# Patient Record
Sex: Male | Born: 1980
Health system: Southern US, Community
[De-identification: ages and names within clinical notes are randomized; demographics above are authoritative.]

## PROBLEM LIST (undated history)

## (undated) DIAGNOSIS — M5126 Other intervertebral disc displacement, lumbar region: Secondary | ICD-10-CM

## (undated) HISTORY — PX: HIP SURGERY: SHX245

---

## 2003-03-17 ENCOUNTER — Emergency Department (HOSPITAL_COMMUNITY): Admission: EM | Admit: 2003-03-17 | Discharge: 2003-03-17 | Payer: Self-pay | Admitting: Emergency Medicine

## 2006-04-10 ENCOUNTER — Inpatient Hospital Stay (HOSPITAL_COMMUNITY): Admission: EM | Admit: 2006-04-10 | Discharge: 2006-04-13 | Payer: Self-pay | Admitting: Emergency Medicine

## 2009-01-26 ENCOUNTER — Emergency Department (HOSPITAL_COMMUNITY): Admission: EM | Admit: 2009-01-26 | Discharge: 2009-01-26 | Payer: Self-pay | Admitting: Emergency Medicine

## 2010-09-19 NOTE — H&P (Signed)
NAME:  Kenneth Blackwell, Kenneth Blackwell NO.:  [id number]   MEDICAL RECORD NO.:  [id number]          PATIENT TYPE:  INP   LOCATION:  0101                         FACILITY:  [facility]   PHYSICIAN:  Leonides Grills, M.D.     DATE OF BIRTH:  1980/06/15   DATE OF ADMISSION:  04/10/2006  DATE OF DISCHARGE:                              HISTORY & PHYSICAL   CHIEF COMPLAINT:  Right hip pain since 1:00 a.m. today.   HISTORY:  This is a 30 year old male who was wrestling with a friend  around 1:00 a.m. today.  He had a forced hyperextension injury to his  right hip.  He had immediate pain and unable to ambulate.  EMS was  called, and he was taken to [facility] ED, where x-rays were obtained,  and I was consulted for further evaluation and treatment.   He has no medical problems.   FAMILY HISTORY:  His grandmother is alive at 47, has a history of  hypertension.   He is a smoker, half pack per day for seven years.  He is a Consulting civil engineer at  [employer].   No known drug allergies.   Is not taking any medications.   REVIEW OF SYSTEMS:  He denies any chest pain, fever, chills,  palpitations, abdominal pain, shortness of breath, wheezing, and no  other areas of pain other than his right hip.   PHYSICAL EXAMINATION:  VITAL SIGNS:  Temperature 97.7, pulse 80,  respirations 18, blood pressure 127/70.  GENERAL:  Well-developed and well-nourished in no apparent distress.  Very pleasant gentleman.  CHEST:  Clear to auscultation.  HEART:  Regular rate and rhythm.  ABDOMEN:  Soft and nontender.  VASCULAR/NEUROMUSCULAR:  Palpable radial pulse bilaterally, dorsalis  pedis and posterior tibial pulses.  Extremities are warm.  Sensation is  intact to light touch over L4-S1 distribution as well as a the C6  through C8 distribution, equal bilaterally.  EXTREMITIES:  His right lower extremity is held externally rotated, but  it is not shortened, compared to the contralateral side, and obviously  range of motion was  not tested.  There were no skin abnormalities around  the hip.   X-rays reveal a nondisplaced, basi-cervical right femoral neck fracture.   IMPRESSION:  Right nondisplaced femoral neck fracture, basi-cervical  type.   PLAN:  Patient is to be admitted.  He is at bedrest.  He is n.p.o.  I  spoke with Dr. Charlann Boxer, who is on call today, and he will perform this  procedure early this morning.  He was consented for a closed  reduction/percutaneous screw fixation versus open reduction/internal  fixation of his right femoral neck fracture.  We went over the risks,  which  include infection, nerve and vessel injury, nonunion/malunion, hardware  rotation under failure, avascular necrosis of the head, and possible  arthritis of the hip were all experienced.  Questions were encouraged  and answered.  We will proceed with this, obviously in the near future.  Labs were ordered and are pending.      Leonides Grills, M.D.  Electronically Signed     PB/MEDQ  D:  04/10/2006  T:  04/10/2006  Job:  [id number]

## 2010-09-19 NOTE — Discharge Summary (Signed)
NAMEMarland Kitchen  Kenneth Blackwell, Kenneth Blackwell NO.:  [id number]   MEDICAL RECORD NO.:  [id number]          PATIENT TYPE:  INP   LOCATION:  1506                         FACILITY:  [facility]   PHYSICIAN:  Madlyn Frankel. Charlann Boxer, M.D.  DATE OF BIRTH:  [birth year]   DATE OF ADMISSION:  04/10/2006  DATE OF DISCHARGE:  04/13/2006                               DISCHARGE SUMMARY   ADMITTING DIAGNOSIS:  Femur fracture.Marland Kitchen   DISCHARGE DIAGNOSIS:  Femur fracture.   CONSULTATIONS:  None.Marland Kitchen   PROCEDURE:  Open reduction internal fixation of right femur fracture.   SURGEON:  Madlyn Frankel. Charlann Boxer, M.D.   BRIEF HISTORY:  This 30 year old male was wrestling with a friend around  1:00 a.m.; he had a forced hyperextension injury to his right hip.  He  had immediate pain and was unable to walk.  EMS was called and he was  taken to the [facility] Emergency Department, where x-rays were  obtained and we were consulted for further evaluation and treatment.   LABS:  White blood cells on April 10, 2006 was 13.7, hemoglobin 31.8,  hematocrit 41.4.  Prior to discharge hemoglobin 11.7, hematocrit 34.5  and stable.  Differential showed no significant findings upon discharge  and showed no significant findings.  Coagulation within normal limits.  Routine chemistries showed:  Sodium 139, potassium 4.0, glucose 100;  prior to discharge all within normal limits except glucose was bumped to  142.   DIAGNOSTIC TESTING:  Right hip and pelvis x-ray showed a basocervical  fracture of the right femoral neck.   HOSPITAL COURSE:  The patient was seen in the emergency department by  our partner Dr Lestine Box.  He was then consulted and seen by Dr. Charlann Boxer.  He  was taken to an operating room for open reduction internal fixation with  a percutaneous screw fixation with DePuy titanium screws -- by surgeon  Dr. Durene Romans.  He tolerated the procedure well and was admitted to  the orthopedic floor.   The patient's pain was well-controlled.   On postop day #1 the patient  was able to rest comfortably, hemodynamically he was stable.  He was  neuromuscularly and vascularly intact.  Dressing was clean, dry and  intact.  PT and OT were started.  The patient was able to ambulate with  the use of crutches.   The patient did well through postop day #2.  By postop day #3 (April 13, 2006) he was stable.  He was ready to go home.  The wound was clean,  dry and intact.  Neuromuscularly and vascularly he was intact.  He was  prescribed Lovenox and given instructions on its use.  He was to be  weightbearing as tolerated with the use of crutches.   DISCHARGE DISPOSITION:  Discharged to home stable and improved.   DISCHARGE MEDICATIONS:  1. Lovenox 40 mg one subcutaneously daily x2 weeks.  2. Vicodin 1-2 tablets p.o. q.4-6 h. p.r.n.  3. Robaxin 500 mg 1-2 p.o. q.4-6 h. p.r.n. muscle spasm pain.  4. Enteric-coated aspirin 325 mg x3 weeks after the Lovenox.   DISCHARGE WOUND CARE:  Keep wound dry.  May shower; however, wrap, dry  and then redress as needed.   DISCHARGE PHYSICAL THERAPY:  The patient is weightbearing as tolerated  with the use of crutches as needed.  Physical therapy recommendations  were 7x per week for the first week.   DISCHARGE FOLLOW-UP APPOINTMENTS:  Madlyn Frankel. Charlann Boxer, M.D. [phone] in 10  days to 2 weeks after discharge for wound check.  He can return if his  condition worsens.  If he develops any shortness of breath or severe  calf pain, he should call emergency service immediately.     ______________________________  Yetta Glassman [address], [place]      Madlyn Frankel. Charlann Boxer, M.D.  Electronically Signed    BLM/MEDQ  D:  05/06/2006  T:  05/06/2006  Job:  [id number]
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
## 2011-01-28 ENCOUNTER — Emergency Department (HOSPITAL_COMMUNITY)
Admission: EM | Admit: 2011-01-28 | Discharge: 2011-01-28 | Disposition: A | Discharge status: Home / Self Care | Payer: Self-pay | Attending: Emergency Medicine | Admitting: Emergency Medicine

## 2011-01-28 ENCOUNTER — Emergency Department (HOSPITAL_BASED_OUTPATIENT_CLINIC_OR_DEPARTMENT_OTHER)
Admission: EM | Admit: 2011-01-28 | Discharge: 2011-01-28 | Disposition: A | Discharge status: Home / Self Care | Payer: Self-pay | Attending: Emergency Medicine | Admitting: Emergency Medicine

## 2011-01-28 ENCOUNTER — Encounter: Payer: Self-pay | Admitting: *Deleted

## 2011-01-28 ENCOUNTER — Emergency Department (INDEPENDENT_AMBULATORY_CARE_PROVIDER_SITE_OTHER): Payer: Self-pay

## 2011-01-28 DIAGNOSIS — R51 Headache: Secondary | ICD-10-CM

## 2011-01-28 DIAGNOSIS — R42 Dizziness and giddiness: Secondary | ICD-10-CM | POA: Insufficient documentation

## 2011-01-28 DIAGNOSIS — R5383 Other fatigue: Secondary | ICD-10-CM | POA: Insufficient documentation

## 2011-01-28 DIAGNOSIS — R0989 Other specified symptoms and signs involving the circulatory and respiratory systems: Secondary | ICD-10-CM

## 2011-01-28 DIAGNOSIS — J329 Chronic sinusitis, unspecified: Secondary | ICD-10-CM | POA: Insufficient documentation

## 2011-01-28 DIAGNOSIS — R002 Palpitations: Secondary | ICD-10-CM | POA: Insufficient documentation

## 2011-01-28 DIAGNOSIS — J322 Chronic ethmoidal sinusitis: Secondary | ICD-10-CM

## 2011-01-28 DIAGNOSIS — R5381 Other malaise: Secondary | ICD-10-CM | POA: Insufficient documentation

## 2011-01-28 DIAGNOSIS — F17200 Nicotine dependence, unspecified, uncomplicated: Secondary | ICD-10-CM | POA: Insufficient documentation

## 2011-01-28 DIAGNOSIS — J320 Chronic maxillary sinusitis: Secondary | ICD-10-CM

## 2011-01-28 MED ORDER — AMOXICILLIN 500 MG PO CAPS: 500.0000 mg | ORAL_CAPSULE | Freq: Three times a day (TID) | ORAL | Status: AC

## 2011-01-28 MED ORDER — KETOROLAC TROMETHAMINE 60 MG/2ML IM SOLN
60.0000 mg | Freq: Once | INTRAMUSCULAR | Status: AC
  Administered 2011-01-28: 60 mg via INTRAMUSCULAR
  Filled 2011-01-28: qty 2

## 2011-01-28 MED ORDER — METOCLOPRAMIDE HCL 5 MG/ML IJ SOLN
10.0000 mg | Freq: Once | INTRAMUSCULAR | Status: AC
  Administered 2011-01-28: 10 mg via INTRAMUSCULAR
  Filled 2011-01-28: qty 2

## 2011-01-28 NOTE — ED Notes (Signed)
Patient was at [facility] prior to coming to [facility]. Family states that patient was triaged and then they left prior to being seen by provider.

## 2011-01-28 NOTE — ED Provider Notes (Signed)
History     CSN: [id number] Arrival date & time: 01/28/2011  8:08 PM  Chief Complaint  Patient presents with  . URI    (Consider location/radiation/quality/duration/timing/severity/associated sxs/prior treatment) HPI Comments: Pt states that he has been taking cold medication over the last couple of days and this morning he felt near syncopal:pt state that he reached up to get and iron and he "Blanked out":pt state that he didn't loose consciousness   Patient is a 30 y.o. male presenting with URI. The history is provided by the patient. No language interpreter was used.  URI The primary symptoms include headaches, sore throat and cough. Primary symptoms do not include fever, nausea, vomiting or rash. The current episode started 3 to 5 days ago. This is a new problem. The problem has been gradually worsening.  Symptoms associated with the illness include facial pain, sinus pressure and congestion.    History reviewed. No pertinent past medical history.  Past Surgical History  Procedure Date  . Hip surgery     No family history on file.  History  Substance Use Topics  . Smoking status: Current Everyday Smoker  . Smokeless tobacco: Not on file  . Alcohol Use: Yes      Review of Systems  Constitutional: Negative for fever.  HENT: Positive for congestion, sore throat and sinus pressure.   Respiratory: Positive for cough.   Gastrointestinal: Negative for nausea and vomiting.  Skin: Negative for rash.  Neurological: Positive for headaches.  All other systems reviewed and are negative.    Allergies  Review of patient's allergies indicates no known allergies.  Home Medications   Current Outpatient Rx  Name Route Sig Dispense Refill  . HYDROCODONE-ACETAMINOPHEN PO Oral Take 2 tablets by mouth once.      . IBUPROFEN 200 MG PO TABS Oral Take 200 mg by mouth every 6 (six) hours as needed. pain     . DAYTIME SINUS RELIEF PO Oral Take 2 tablets by mouth every 6 (six) hours  as needed. Sinus congestion     . AMOXICILLIN 500 MG PO CAPS Oral Take 1 capsule (500 mg total) by mouth 3 (three) times daily. 30 capsule 0    BP 150/89  Pulse 68  Temp(Src) 97.5 F (36.4 C) (Oral)  Resp 20  Ht 6' (1.829 m)  Wt 148 lb (67.132 kg)  BMI 20.07 kg/m2  SpO2 99%  Physical Exam  Nursing note and vitals reviewed. Constitutional: He is oriented to person, place, and time. He appears well-developed and well-nourished.  HENT:  Head: Normocephalic and atraumatic.  Right Ear: External ear normal.  Left Ear: External ear normal.  Nose: Rhinorrhea present.  Mouth/Throat: Oropharynx is clear and moist.  Eyes: Pupils are equal, round, and reactive to light.  Neck: Normal range of motion. Neck supple.  Cardiovascular: Regular rhythm.   Pulmonary/Chest: Effort normal and breath sounds normal.  Abdominal: Soft.  Musculoskeletal: Normal range of motion.  Neurological: He is alert and oriented to person, place, and time.  Skin: Skin is warm and dry.  Psychiatric: He has a normal mood and affect.    ED Course  Procedures (including critical care time)  Labs Reviewed - No data to display Ct Head Wo Contrast  01/28/2011  *RADIOLOGY REPORT*  Clinical Data: Headache and congestion.  CT HEAD WITHOUT CONTRAST  Technique:  Contiguous axial images were obtained from the base of the skull through the vertex without contrast.  Comparison: None  Findings: The ventricles are normal.  No  extra-axial fluid collections are seen.  The brainstem and cerebellum are unremarkable.  No acute intracranial findings such as infarction or hemorrhage.  No mass lesions.  The bony calvarium is intact.  There is bilateral acute maxillary sinusitis and scattered ethmoid sinus disease.  The sphenoid sinuses clear and the frontal sinuses clear.  The mastoid air cells and middle ear cavities are clear.  IMPRESSION:  1.  No acute intracranial findings or mass lesion. 2.  Acute maxillary and ethmoid sinusitis.   Original Report Authenticated By: P. Loralie Champagne, M.D.     1. Sinusitis       MDM  Pt is feeling better after the medications:will treat for sinusitis        Teressa Lower, NP 01/28/11 2142

## 2011-01-28 NOTE — ED Notes (Signed)
Pt states that he had cold symptoms and took some medication last night for congestion. Since waking up this morning he has been feeling dizzy and this morning "he blanked out and when he came to he thought he was having a seizure because he was still holding the iron and shaking".  Feels like "his brain is tingling" and his heart is racing. HR is 90

## 2011-01-28 NOTE — ED Notes (Signed)
Pt reports cold symptoms (cough/runny nose/congestoin) x3-4days. Took OTC cold medicine last night. States this morning, began having a headache with "tingling" to his head, accompanied by dizziness, and pt states he passed out today while reaching for an iron. States "i was out for a couple seconds" denies injury.

## 2011-01-29 NOTE — ED Provider Notes (Signed)
Evaluation and management procedures were performed by the mid-level provider (PA/NP/CNM) under my supervision/collaboration. I was present and available during the ED course. Tatyanna Cronk Y.   Gavin Pound. Oletta Lamas, MD 01/29/11 1610

## 2012-09-07 ENCOUNTER — Encounter (HOSPITAL_COMMUNITY): Payer: Self-pay | Admitting: *Deleted

## 2012-09-07 ENCOUNTER — Emergency Department (HOSPITAL_COMMUNITY)
Admission: EM | Admit: 2012-09-07 | Discharge: 2012-09-07 | Disposition: A | Discharge status: Home / Self Care | Payer: Self-pay | Attending: Emergency Medicine | Admitting: Emergency Medicine

## 2012-09-07 ENCOUNTER — Emergency Department (HOSPITAL_COMMUNITY): Payer: Self-pay

## 2012-09-07 DIAGNOSIS — N410 Acute prostatitis: Secondary | ICD-10-CM

## 2012-09-07 DIAGNOSIS — N509 Disorder of male genital organs, unspecified: Secondary | ICD-10-CM | POA: Insufficient documentation

## 2012-09-07 DIAGNOSIS — N419 Inflammatory disease of prostate, unspecified: Secondary | ICD-10-CM | POA: Insufficient documentation

## 2012-09-07 DIAGNOSIS — F17200 Nicotine dependence, unspecified, uncomplicated: Secondary | ICD-10-CM | POA: Insufficient documentation

## 2012-09-07 LAB — URINALYSIS, ROUTINE W REFLEX MICROSCOPIC
Glucose, UA: NEGATIVE mg/dL
Hgb urine dipstick: NEGATIVE
Leukocytes, UA: NEGATIVE
Nitrite: NEGATIVE
Protein, ur: NEGATIVE mg/dL
Specific Gravity, Urine: 1.033 — ABNORMAL HIGH (ref 1.005–1.030)

## 2012-09-07 MED ORDER — IBUPROFEN 800 MG PO TABS: 800.0000 mg | ORAL_TABLET | Freq: Three times a day (TID) | ORAL | Status: DC | PRN

## 2012-09-07 MED ORDER — CIPROFLOXACIN HCL 500 MG PO TABS: 500.0000 mg | ORAL_TABLET | Freq: Two times a day (BID) | ORAL | Status: DC

## 2012-09-07 NOTE — ED Provider Notes (Signed)
Medical screening examination/treatment/procedure(s) were performed by non-physician practitioner and as supervising physician I was immediately available for consultation/collaboration.   Gwyneth Sprout, MD 09/07/12 2355

## 2012-09-07 NOTE — ED Notes (Signed)
Pt c/o dysuria and testicle pain x's 1 week. Denies penile discharge or hematuria.

## 2012-09-07 NOTE — ED Provider Notes (Signed)
History     CSN: [id number]  Arrival date & time 09/07/12  1522   First MD Initiated Contact with Patient 09/07/12 1704      Chief Complaint  Patient presents with  . Dysuria  . Testicle Pain    (Consider location/radiation/quality/duration/timing/severity/associated sxs/prior treatment) HPI Patient presents emergency Department with some dysuria, and lower scrotal pain for the last 3 days.  Patient, states his girlfriend was diagnosed with bacterial vaginosis patient, states it is not have any pain in his central testicle or discharge.  Patient denies fever, nausea, vomiting, diarrhea, abdominal pain, weakness, or syncope.  Patient, states, that he did not take any medications prior to arrival. History reviewed. No pertinent past medical history.  Past Surgical History  Procedure Laterality Date  . Hip surgery      History reviewed. No pertinent family history.  History  Substance Use Topics  . Smoking status: Current Every Day Smoker    Types: Cigarettes  . Smokeless tobacco: Not on file  . Alcohol Use: Yes      Review of Systems All other systems negative except as documented in the HPI. All pertinent positives and negatives as reviewed in the HPI. Allergies  Review of patient's allergies indicates no known allergies.  Home Medications  No current outpatient prescriptions on file.  BP 114/79  Pulse 71  Temp(Src) 98.3 F (36.8 C) (Oral)  Resp 16  SpO2 100%  Physical Exam  Nursing note and vitals reviewed. Constitutional: He is oriented to person, place, and time. He appears well-developed and well-nourished.  HENT:  Head: Normocephalic and atraumatic.  Cardiovascular: Normal rate and regular rhythm.   Pulmonary/Chest: Effort normal.  Abdominal: Hernia confirmed negative in the right inguinal area and confirmed negative in the left inguinal area.  Genitourinary: Testes normal.    Right testis shows no mass, no swelling and no tenderness. Left testis  shows no mass, no swelling and no tenderness. No penile tenderness. No discharge found.  Lymphadenopathy:       Right: No inguinal adenopathy present.       Left: No inguinal adenopathy present.  Neurological: He is alert and oriented to person, place, and time.  Skin: Skin is warm and dry.    ED Course  Procedures (including critical care time)  Labs Reviewed  URINALYSIS, ROUTINE W REFLEX MICROSCOPIC - Abnormal; Notable for the following:    Specific Gravity, Urine 1.033 (*)    All other components within normal limits   US Scrotum  09/07/2012  *RADIOLOGY REPORT*  Clinical Data:  Bilateral scrotal pain 1 week  SCROTAL ULTRASOUND DOPPLER ULTRASOUND OF THE TESTICLES  Technique: Complete ultrasound examination of the testicles, epididymis, and other scrotal structures was performed.  Color and spectral Doppler ultrasound were also utilized to evaluate blood flow to the testicles.  Comparison:  No  Findings:  Right testis:    3.9 x 2.5 x 4.0 cm.  Negative for mass.  Left testis:  4.0 x 3.0 x 3.4 cm.  Negative for mass.  Right epididymis:  Normal in size and appearance.  Left epididymis:  Normal in size and appearance.  Hydrocele:  Small bilateral hydroceles containing echogenic debris  Varicocele:  Bilateral varicocele.  Increased blood flow on Valsalva bilaterally.  Pulsed Doppler interrogation of both testes demonstrates low resistance flow bilaterally.  IMPRESSION: Negative for testicular torsion.  Negative for mass lesion.  Bilateral varicocele.  Bilateral complex hydrocele   Original Report Authenticated By: Janeece Riggers, M.D.    [place] Art/ven  Flow Abd Pelv Doppler  09/07/2012  *RADIOLOGY REPORT*  Clinical Data:  Bilateral scrotal pain 1 week  SCROTAL ULTRASOUND DOPPLER ULTRASOUND OF THE TESTICLES  Technique: Complete ultrasound examination of the testicles, epididymis, and other scrotal structures was performed.  Color and spectral Doppler ultrasound were also utilized to evaluate blood flow to the  testicles.  Comparison:  No  Findings:  Right testis:    3.9 x 2.5 x 4.0 cm.  Negative for mass.  Left testis:  4.0 x 3.0 x 3.4 cm.  Negative for mass.  Right epididymis:  Normal in size and appearance.  Left epididymis:  Normal in size and appearance.  Hydrocele:  Small bilateral hydroceles containing echogenic debris  Varicocele:  Bilateral varicocele.  Increased blood flow on Valsalva bilaterally.  Pulsed Doppler interrogation of both testes demonstrates low resistance flow bilaterally.  IMPRESSION: Negative for testicular torsion.  Negative for mass lesion.  Bilateral varicocele.  Bilateral complex hydrocele   Original Report Authenticated By: Janeece Riggers, M.D.     We'll have the patient Followup urology, and we'll place on Cipro for 21 days for presumed prostatitis.  Patient is a rest return here for any worsening in his condition.   MDM  MDM Reviewed: vitals and nursing note Interpretation: labs and ultrasound            Carlyle Dolly, PA-C 09/07/12 1852

## 2016-08-19 DIAGNOSIS — R3129 Other microscopic hematuria: Secondary | ICD-10-CM | POA: Diagnosis not present

## 2016-08-19 DIAGNOSIS — Z0000 Encounter for general adult medical examination without abnormal findings: Secondary | ICD-10-CM | POA: Diagnosis not present

## 2016-08-19 DIAGNOSIS — Z13220 Encounter for screening for lipoid disorders: Secondary | ICD-10-CM | POA: Diagnosis not present

## 2016-08-19 DIAGNOSIS — Z23 Encounter for immunization: Secondary | ICD-10-CM | POA: Diagnosis not present

## 2017-07-27 ENCOUNTER — Encounter (HOSPITAL_BASED_OUTPATIENT_CLINIC_OR_DEPARTMENT_OTHER): Payer: Self-pay | Admitting: *Deleted

## 2017-07-27 ENCOUNTER — Other Ambulatory Visit: Payer: Self-pay

## 2017-07-27 ENCOUNTER — Emergency Department (HOSPITAL_BASED_OUTPATIENT_CLINIC_OR_DEPARTMENT_OTHER)
Admission: EM | Admit: 2017-07-27 | Discharge: 2017-07-28 | Disposition: A | Discharge status: Home / Self Care | Payer: BLUE CROSS/BLUE SHIELD | Attending: Emergency Medicine | Admitting: Emergency Medicine

## 2017-07-27 ENCOUNTER — Emergency Department (HOSPITAL_BASED_OUTPATIENT_CLINIC_OR_DEPARTMENT_OTHER): Payer: BLUE CROSS/BLUE SHIELD

## 2017-07-27 DIAGNOSIS — Z79899 Other long term (current) drug therapy: Secondary | ICD-10-CM | POA: Insufficient documentation

## 2017-07-27 DIAGNOSIS — R0981 Nasal congestion: Secondary | ICD-10-CM | POA: Diagnosis not present

## 2017-07-27 DIAGNOSIS — J069 Acute upper respiratory infection, unspecified: Secondary | ICD-10-CM | POA: Diagnosis not present

## 2017-07-27 DIAGNOSIS — F17210 Nicotine dependence, cigarettes, uncomplicated: Secondary | ICD-10-CM | POA: Insufficient documentation

## 2017-07-27 DIAGNOSIS — R69 Illness, unspecified: Secondary | ICD-10-CM

## 2017-07-27 DIAGNOSIS — B9789 Other viral agents as the cause of diseases classified elsewhere: Secondary | ICD-10-CM | POA: Insufficient documentation

## 2017-07-27 DIAGNOSIS — J111 Influenza due to unidentified influenza virus with other respiratory manifestations: Secondary | ICD-10-CM

## 2017-07-27 DIAGNOSIS — J029 Acute pharyngitis, unspecified: Secondary | ICD-10-CM | POA: Diagnosis not present

## 2017-07-27 DIAGNOSIS — R05 Cough: Secondary | ICD-10-CM | POA: Diagnosis not present

## 2017-07-27 NOTE — ED Triage Notes (Signed)
Cough, fever, headache, chills x 2 days.

## 2017-07-27 NOTE — ED Notes (Signed)
Pt. Reports cough for a couple of days.  Pt. Reports he came tonight because he has had body aches and chills.

## 2017-07-28 MED ORDER — BENZONATATE 100 MG PO CAPS: 100.0000 mg | ORAL_CAPSULE | Freq: Three times a day (TID) | ORAL | 0 refills | Status: DC

## 2017-07-28 MED ORDER — IBUPROFEN 400 MG PO TABS
600.0000 mg | ORAL_TABLET | Freq: Once | ORAL | Status: AC
  Administered 2017-07-28: 600 mg via ORAL
  Filled 2017-07-28: qty 1

## 2017-07-28 NOTE — ED Provider Notes (Signed)
[facility] Provider Note   CSN: [id number] Arrival date & time: 07/27/17  2119     History   Chief Complaint Chief Complaint  Patient presents with  . Cough    HPI Kenneth Blackwell is a 37 y.o. male who presents for evaluation of cough, nasal congestion, rhinorrhea, subjective fever chills and body aches have been going on for 3 days.  Cough is productive of green phlegm.  He reports he does not take any medications for the pain.  He reports subjective fever but states he is actually measure the temperature.  Reports he has been able to eat without any difficulty.  Patient denies any chest pain, difficulty breathing, nausea/vomiting, diarrhea.  Patient states he did not get a flu shot this year.  The history is provided by the patient.    History reviewed. No pertinent past medical history.  There are no active problems to display for this patient.   Past Surgical History:  Procedure Laterality Date  . HIP SURGERY          Home Medications    Prior to Admission medications   Medication Sig Start Date End Date Taking? Authorizing Provider  benzonatate (TESSALON) 100 MG capsule Take 1 capsule (100 mg total) by mouth every 8 (eight) hours. 07/28/17   Volanda Napoleon, PA-C  ciprofloxacin (CIPRO) 500 MG tablet Take 1 tablet (500 mg total) by mouth 2 (two) times daily. 09/07/12   Lawyer, Harrell Gave, PA-C  ibuprofen (ADVIL,MOTRIN) 800 MG tablet Take 1 tablet (800 mg total) by mouth every 8 (eight) hours as needed for pain. 09/07/12   Dalia Heading, PA-C    Family History No family history on file.  Social History Social History   Tobacco Use  . Smoking status: Current Every Day Smoker    Types: Cigarettes  . Smokeless tobacco: Never Used  Substance Use Topics  . Alcohol use: Yes  . Drug use: No     Allergies   Patient has no known allergies.   Review of Systems Review of Systems  Constitutional: Positive for chills and  fever.  HENT: Positive for congestion and rhinorrhea.   Respiratory: Positive for cough. Negative for shortness of breath.   Cardiovascular: Negative for chest pain.  Gastrointestinal: Negative for abdominal pain, nausea and vomiting.  Genitourinary: Negative for dysuria and hematuria.  Musculoskeletal: Positive for myalgias.  Neurological: Negative for headaches.     Physical Exam Updated Vital Signs BP 131/87   Pulse 67   Temp 98 F (36.7 C) (Oral)   Resp 18   Ht 6' (1.829 m)   Wt 68 kg (150 lb)   SpO2 100%   BMI 20.34 kg/m   Physical Exam  Constitutional: He is oriented to person, place, and time. He appears well-developed and well-nourished.  HENT:  Head: Normocephalic and atraumatic.  Nose: Mucosal edema and rhinorrhea present.  Mouth/Throat: Uvula is midline, oropharynx is clear and moist and mucous membranes are normal.  Airway is patent, phonation is intact.  Posterior pharynx without erythema, edema, exudates.  No neck or facial swelling.  Eyes: Pupils are equal, round, and reactive to light. Conjunctivae, EOM and lids are normal.  Neck: Full passive range of motion without pain.  Cardiovascular: Normal rate, regular rhythm, normal heart sounds and normal pulses. Exam reveals no gallop and no friction rub.  No murmur heard. Pulmonary/Chest: Effort normal and breath sounds normal.  No evidence of respiratory distress. Able to speak in full sentences without difficulty.  presents for evaluation of 3 days of nasal congestion, rhinorrhea, cough, generalized body aches, subjective fever chills.  Abdominal: Soft. Normal appearance. There is no tenderness. There is no rigidity and no guarding.  Musculoskeletal: Normal range of motion.  Neurological: He is alert and oriented to person, place, and time.  Skin: Skin is warm and dry. Capillary refill takes less than 2 seconds.  Psychiatric: He has a normal mood and affect. His speech is normal.  Nursing note and vitals  reviewed.    ED Treatments / Results  Labs (all labs ordered are listed, but only abnormal results are displayed) Labs Reviewed - No data to display  EKG None  Radiology Dg Chest 2 View  Result Date: 07/28/2017 CLINICAL DATA:  Cough and fever EXAM: CHEST - 2 VIEW COMPARISON:  None. FINDINGS: The heart size and mediastinal contours are within normal limits. Both lungs are clear. The visualized skeletal structures are unremarkable. IMPRESSION: No active cardiopulmonary disease. Electronically Signed   By: Donavan Foil M.D.   On: 07/28/2017 00:05    Procedures Procedures (including critical care time)  Medications Ordered in ED Medications  ibuprofen (ADVIL,MOTRIN) tablet 600 mg (600 mg Oral Given 07/28/17 0039)     Initial Impression / Assessment and Plan / ED Course  I have reviewed the triage vital signs and the nursing notes.  Pertinent labs & imaging results that were available during my care of the patient were reviewed by me and considered in my medical decision making (see chart for details).     38 y.o. M who presents for evaluation of generalized body aches, subjective fever chills, cough, nasal congestion, rhinorrhea.  No abdominal pain, chest pain, difficulty breathing.  Patient is afebrile, non-toxic appearing, sitting comfortably on examination table. Vital signs reviewed and stable.  Lungs clear to auscultation bilaterally.  No evidence of respiratory distress.  Consider URI with cough versus pneumonia versus influenza.    Chest x-ray reviewed.  Negative for any acute infectious etiology.Discussed results with patient.  Given that patient has been symptomatic for 3 days, he is out of the window for treatment for influenza.  I discussed supportive therapies with patient.  Encourage adequate rest and hydration.  We will plan to send home for symptomatic relief of cough.  Instructed patient to follow-up with primary care doctor the next 2-4 days for further evaluation.  Patient had ample opportunity for questions and discussion. All patient's questions were answered with full understanding. Strict return precautions discussed. Patient expresses understanding and agreement to plan.    Final Clinical Impressions(s) / ED Diagnoses   Final diagnoses:  Viral URI with cough  Influenza-like illness    ED Discharge Orders        Ordered    benzonatate (TESSALON) 100 MG capsule  Every 8 hours     07/28/17 0026       Volanda Napoleon, PA-C 07/28/17 3664    Palumbo, April, MD 07/28/17 4034

## 2017-07-28 NOTE — Discharge Instructions (Signed)
You can take Tylenol or Ibuprofen as directed for pain. You can alternate Tylenol and Ibuprofen every 4 hours. If you take Tylenol at 1pm, then you can take Ibuprofen at 5pm. Then you can take Tylenol again at 9pm.   Make sure you are staying hydrated and drinking plenty of water.   Return to the emergency department for any fever, chest pain, difficulty breathing, vomiting or any other worsening or concerning symptoms.

## 2017-08-23 DIAGNOSIS — Z13220 Encounter for screening for lipoid disorders: Secondary | ICD-10-CM | POA: Diagnosis not present

## 2017-08-23 DIAGNOSIS — Z0000 Encounter for general adult medical examination without abnormal findings: Secondary | ICD-10-CM | POA: Diagnosis not present

## 2018-11-24 DIAGNOSIS — Z0000 Encounter for general adult medical examination without abnormal findings: Secondary | ICD-10-CM | POA: Diagnosis not present

## 2018-12-01 DIAGNOSIS — Z8249 Family history of ischemic heart disease and other diseases of the circulatory system: Secondary | ICD-10-CM | POA: Diagnosis not present

## 2018-12-01 DIAGNOSIS — Z0000 Encounter for general adult medical examination without abnormal findings: Secondary | ICD-10-CM | POA: Diagnosis not present

## 2018-12-01 DIAGNOSIS — Z13220 Encounter for screening for lipoid disorders: Secondary | ICD-10-CM | POA: Diagnosis not present

## 2018-12-01 DIAGNOSIS — F17200 Nicotine dependence, unspecified, uncomplicated: Secondary | ICD-10-CM | POA: Diagnosis not present

## 2019-01-07 DIAGNOSIS — R05 Cough: Secondary | ICD-10-CM | POA: Diagnosis not present

## 2019-01-07 DIAGNOSIS — G4489 Other headache syndrome: Secondary | ICD-10-CM | POA: Diagnosis not present

## 2019-01-07 DIAGNOSIS — U071 COVID-19: Secondary | ICD-10-CM | POA: Diagnosis not present

## 2019-01-07 DIAGNOSIS — R509 Fever, unspecified: Secondary | ICD-10-CM | POA: Diagnosis not present

## 2019-09-11 ENCOUNTER — Ambulatory Visit
Admission: EM | Admit: 2019-09-11 | Discharge: 2019-09-11 | Disposition: A | Discharge status: Home / Self Care | Payer: 59 | Attending: Physician Assistant | Admitting: Physician Assistant

## 2019-09-11 DIAGNOSIS — M25422 Effusion, left elbow: Secondary | ICD-10-CM | POA: Diagnosis not present

## 2019-09-11 DIAGNOSIS — W57XXXA Bitten or stung by nonvenomous insect and other nonvenomous arthropods, initial encounter: Secondary | ICD-10-CM

## 2019-09-11 DIAGNOSIS — R350 Frequency of micturition: Secondary | ICD-10-CM

## 2019-09-11 LAB — POCT URINALYSIS DIP (MANUAL ENTRY)
Bilirubin, UA: NEGATIVE
Glucose, UA: NEGATIVE mg/dL
Ketones, POC UA: NEGATIVE mg/dL
Leukocytes, UA: NEGATIVE
Nitrite, UA: NEGATIVE
Protein Ur, POC: NEGATIVE mg/dL
Spec Grav, UA: >=1.03 — AB (ref 1.010–1.025)
Urobilinogen, UA: 0.2 E.U./dL
pH, UA: 5.5 (ref 5.0–8.0)

## 2019-09-11 MED ORDER — HYDROXYZINE HCL 25 MG PO TABS: 25.0000 mg | ORAL_TABLET | Freq: Four times a day (QID) | ORAL | 0 refills | Status: DC

## 2019-09-11 MED ORDER — TRIAMCINOLONE ACETONIDE 0.1 % EX CREA: 1.0000 "application " | TOPICAL_CREAM | Freq: Two times a day (BID) | CUTANEOUS | 0 refills | Status: DC

## 2019-09-11 NOTE — Discharge Instructions (Addendum)
Insect bite/left elbow swelling Start hydroxyzine for itching. Triamcinolone on affected area. Ice compress to the elbow, avoid scratching. If noticing increased pain, spreading redness, follow up for reevaluation  Urinary frequency No signs of infection or sugar in urine. However, does show you are dehydrated. Decrease soda, tea, coffee intake. Keep hydrated, urine should be clear to pale yellow in color. Follow up with PCP if symptoms not improving.

## 2019-09-11 NOTE — ED Provider Notes (Signed)
[name]    CSN: [id number] Arrival date & time: 09/11/19  [place]      History   Chief Complaint Chief Complaint  Patient presents with  . Insect Bite    HPI Kenneth Blackwell is a 39 y.o. male.   39 year old male comes in for multiple complaints.  1.  2-day history of left elbow, right posterior thigh itching after possible insect bite.  States started since scratching due to itching, and now noticing significant swelling to left elbow with warmth, erythema, throbbing sensation.  Denies decrease in range of motion.  Denies fever.  2.  2-week history of urinary frequency, urgency.  Denies dysuria, hematuria.  Denies abdominal pain, nausea, vomiting.  Denies penile discharge, penile lesion, testicular swelling, testicular pain.  Denies history of diabetes.  States does not drink water frequently, but does drink soda, teas.     History reviewed. No pertinent past medical history.  There are no problems to display for this patient.   Past Surgical History:  Procedure Laterality Date  . HIP SURGERY         Home Medications    Prior to Admission medications   Medication Sig Start Date End Date Taking? Authorizing Provider  hydrOXYzine (ATARAX/VISTARIL) 25 MG tablet Take 1 tablet (25 mg total) by mouth every 6 (six) hours. 09/11/19   Tasia Catchings, Doug Bucklin V, PA-C  triamcinolone cream (KENALOG) 0.1 % Apply 1 application topically 2 (two) times daily. 09/11/19   Ok Edwards, PA-C    Family History History reviewed. No pertinent family history.  Social History Social History   Tobacco Use  . Smoking status: Current Every Day Smoker    Types: Cigarettes  . Smokeless tobacco: Never Used  Substance Use Topics  . Alcohol use: Yes  . Drug use: No     Allergies   Patient has no known allergies.   Review of Systems Review of Systems  Reason unable to perform ROS: See HPI as above.     Physical Exam Triage Vital Signs ED Triage Vitals  Enc Vitals Group     BP  09/11/19 1708 (!) 113/58     Pulse Rate 09/11/19 1708 85     Resp 09/11/19 1708 16     Temp 09/11/19 1708 98.1 F (36.7 C)     Temp Source 09/11/19 1708 Oral     SpO2 09/11/19 1708 95 %     Weight --      Height --      Head Circumference --      Peak Flow --      Pain Score 09/11/19 1714 4     Pain Loc --      Pain Edu? --      Excl. in [place]? --    No data found.  Updated Vital Signs BP (!) 113/58 (BP Location: Left Arm)   Pulse 85   Temp 98.1 F (36.7 C) (Oral)   Resp 16   SpO2 95%   Visual Acuity Right Eye Distance:   Left Eye Distance:   Bilateral Distance:    Right Eye Near:   Left Eye Near:    Bilateral Near:     Physical Exam Constitutional:      General: He is not in acute distress.    Appearance: Normal appearance. He is well-developed. He is not toxic-appearing or diaphoretic.  HENT:     Head: Normocephalic and atraumatic.  Eyes:     Conjunctiva/sclera: Conjunctivae normal.  Pupils: Pupils are equal, round, and reactive to light.  Pulmonary:     Effort: Pulmonary effort is normal. No respiratory distress.     Comments: Speaking in full sentences without difficulty Musculoskeletal:     Cervical back: Normal range of motion and neck supple.     Comments: Left elbow with significant swelling, mostly medially. Erythema with warmth. No induration, no tenderness to palpation. Full ROM  Skin:    General: Skin is warm and dry.     Comments: Right posterior thigh with 3cm x 2cm papule, excoriations noted. Surrounding erythema without warmth. No tenderness  Neurological:     Mental Status: He is alert and oriented to person, place, and time.      UC Treatments / Results  Labs (all labs ordered are listed, but only abnormal results are displayed) Labs Reviewed  POCT URINALYSIS DIP (MANUAL ENTRY) - Abnormal; Notable for the following components:      Result Value   Spec Grav, UA >=1.030 (*)    Blood, UA small (*)    All other components within normal  limits    EKG   Radiology No results found.  Procedures Procedures (including critical care time)  Medications Ordered in UC Medications - No data to display  Initial Impression / Assessment and Plan / UC Course  I have reviewed the triage vital signs and the nursing notes.  Pertinent labs & imaging results that were available during my care of the patient were reviewed by me and considered in my medical decision making (see chart for details).    1. Insect bite/left elbow swelling Likely inflammatory in nature.  Will provide hydroxyzine, triamcinolone for itching.  Ice compress to the elbow.  Return precautions given.  2.  Urinary frequency  Urine negative for glucose, infection.  Discussed at this time to decrease caffeine use, increase water intake.  Continue to monitor symptoms.  Return precautions given.  Patient expresses understanding and agrees to plan.  Final Clinical Impressions(s) / UC Diagnoses   Final diagnoses:  Elbow swelling, left  Insect bite, multiple  Urinary frequency   ED Prescriptions    Medication Sig Dispense Auth. Provider   hydrOXYzine (ATARAX/VISTARIL) 25 MG tablet Take 1 tablet (25 mg total) by mouth every 6 (six) hours. 12 tablet Koji Niehoff V, PA-C   triamcinolone cream (KENALOG) 0.1 % Apply 1 application topically 2 (two) times daily. 30 g Ok Edwards, PA-C     PDMP not reviewed this encounter.   Ok Edwards, PA-C 09/11/19 [id number]

## 2019-09-11 NOTE — ED Triage Notes (Signed)
Pt states was bite by something to lt elbow and rt upper back of leg early Sunday morning. States now having swelling to lt elbow. Pt also having urinary urgency and frequency x2wks.

## 2019-09-14 IMAGING — CR DG CHEST 2V
2 series · 2 of 2 positions shown · non-contrast
Comparison: None.

CLINICAL DATA: Cough and fever

EXAM:
CHEST - 2 VIEW

[w chest pa]
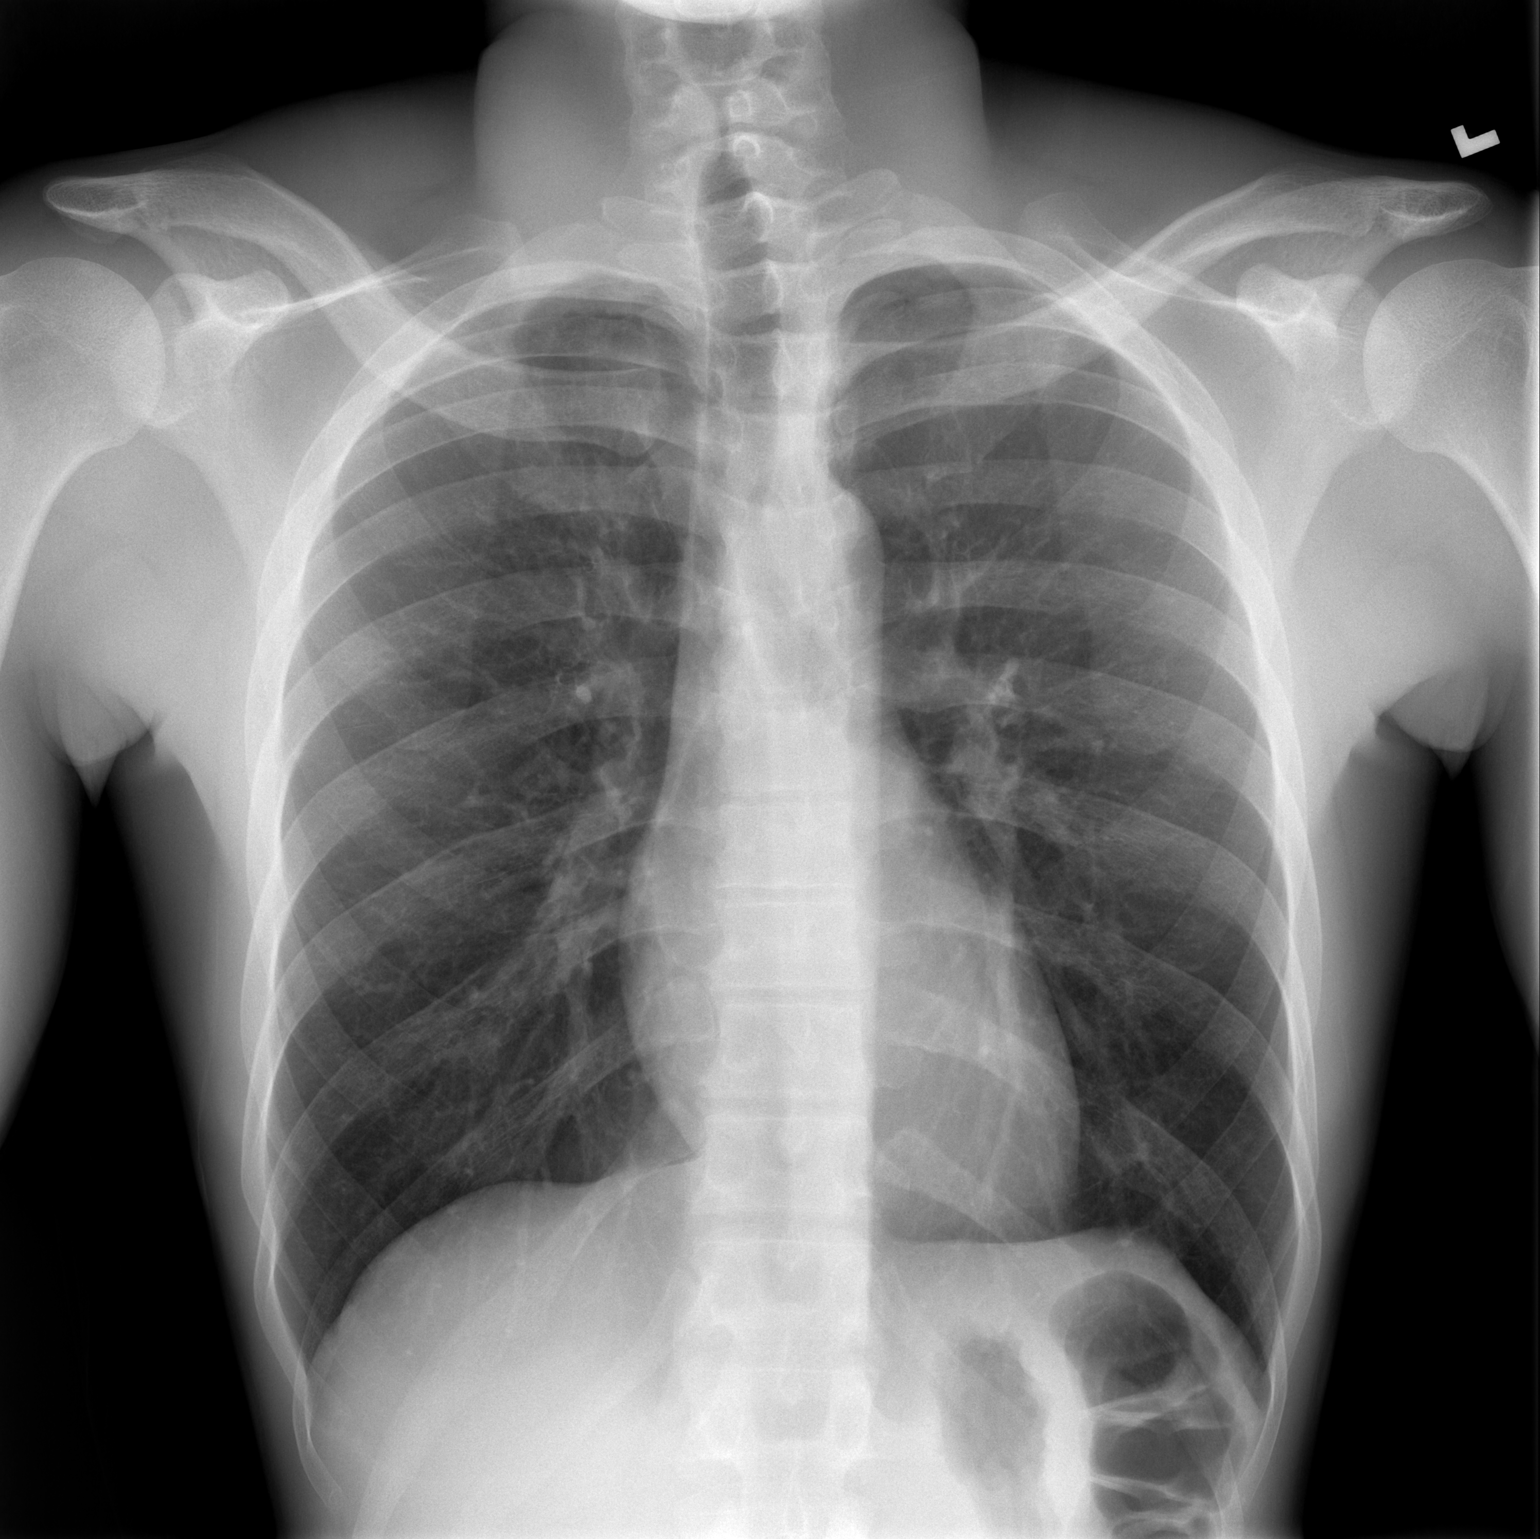

[w chest lat]
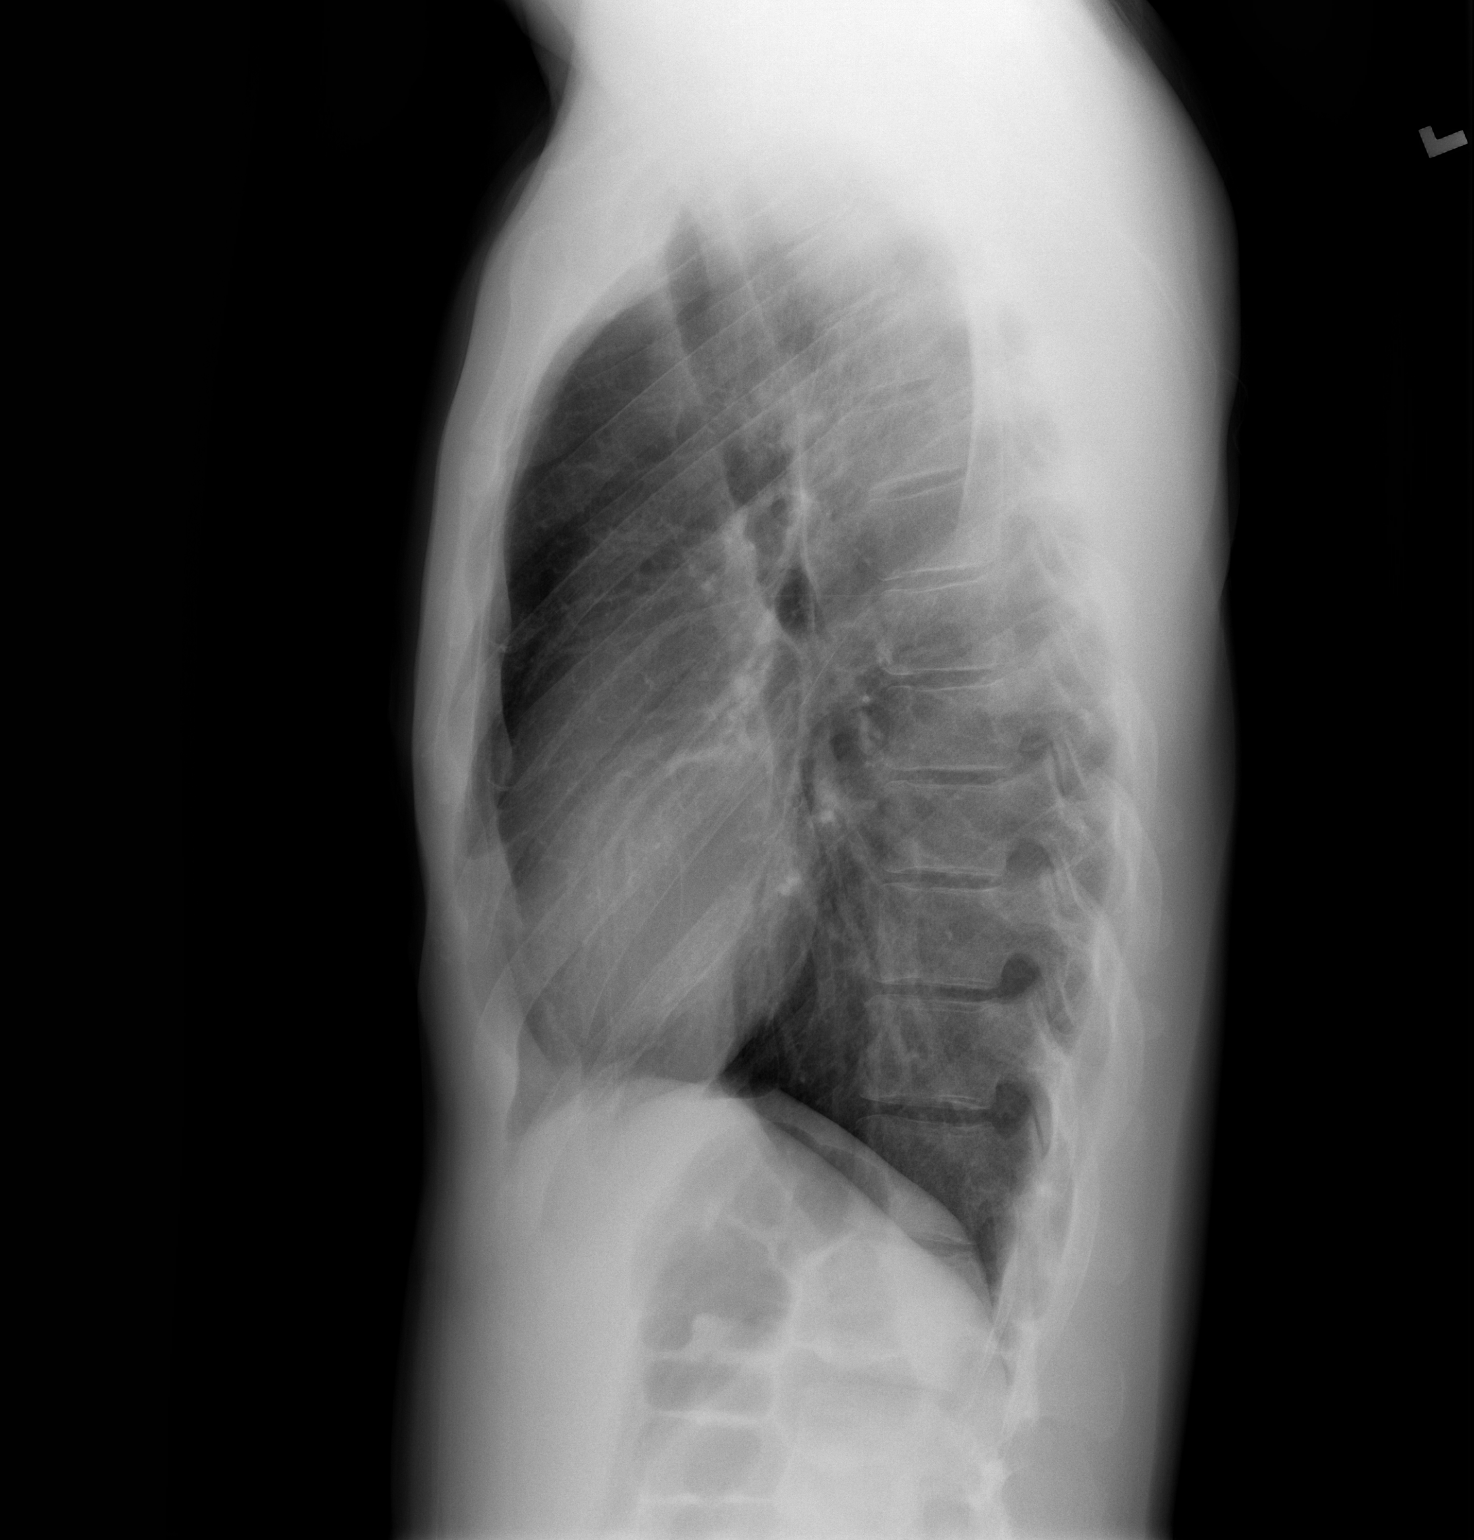

[2 of 2 positions shown; findings below may reference images not displayed]

FINDINGS: The heart size and mediastinal contours are within normal limits.
Both lungs are clear. The visualized skeletal structures are
unremarkable.
IMPRESSION: No active cardiopulmonary disease.

## 2020-04-03 DIAGNOSIS — M545 Low back pain, unspecified: Secondary | ICD-10-CM | POA: Insufficient documentation

## 2020-04-30 DIAGNOSIS — M5416 Radiculopathy, lumbar region: Secondary | ICD-10-CM | POA: Insufficient documentation

## 2020-08-25 ENCOUNTER — Observation Stay (HOSPITAL_COMMUNITY)
Admission: EM | Admit: 2020-08-25 | Discharge: 2020-08-26 | Disposition: A | Discharge status: Home / Self Care | Payer: 59 | Attending: Surgery | Admitting: Surgery

## 2020-08-25 ENCOUNTER — Other Ambulatory Visit: Payer: Self-pay

## 2020-08-25 ENCOUNTER — Emergency Department (HOSPITAL_COMMUNITY): Payer: 59

## 2020-08-25 ENCOUNTER — Encounter (HOSPITAL_COMMUNITY): Payer: Self-pay

## 2020-08-25 DIAGNOSIS — R1012 Left upper quadrant pain: Secondary | ICD-10-CM | POA: Diagnosis present

## 2020-08-25 DIAGNOSIS — F17210 Nicotine dependence, cigarettes, uncomplicated: Secondary | ICD-10-CM | POA: Insufficient documentation

## 2020-08-25 DIAGNOSIS — R109 Unspecified abdominal pain: Secondary | ICD-10-CM | POA: Diagnosis present

## 2020-08-25 DIAGNOSIS — R1909 Other intra-abdominal and pelvic swelling, mass and lump: Principal | ICD-10-CM | POA: Insufficient documentation

## 2020-08-25 DIAGNOSIS — Z20822 Contact with and (suspected) exposure to covid-19: Secondary | ICD-10-CM | POA: Diagnosis not present

## 2020-08-25 DIAGNOSIS — R1031 Right lower quadrant pain: Secondary | ICD-10-CM

## 2020-08-25 LAB — COMPREHENSIVE METABOLIC PANEL
ALT: 47 U/L — ABNORMAL HIGH (ref 0–44)
AST: 28 U/L (ref 15–41)
Albumin: 4.4 g/dL (ref 3.5–5.0)
Alkaline Phosphatase: 62 U/L (ref 38–126)
Anion gap: 7 (ref 5–15)
BUN: 10 mg/dL (ref 6–20)
CO2: 26 mmol/L (ref 22–32)
Calcium: 9.6 mg/dL (ref 8.9–10.3)
Chloride: 106 mmol/L (ref 98–111)
Creatinine, Ser: 1.18 mg/dL (ref 0.61–1.24)
GFR, Estimated: >60 mL/min (ref >60)
Glucose, Bld: 102 mg/dL — ABNORMAL HIGH (ref 70–99)
Potassium: 4.2 mmol/L (ref 3.5–5.1)
Sodium: 139 mmol/L (ref 135–145)
Total Bilirubin: 0.7 mg/dL (ref 0.3–1.2)
Total Protein: 7.6 g/dL (ref 6.5–8.1)

## 2020-08-25 LAB — CBC WITH DIFFERENTIAL/PLATELET
Abs Immature Granulocytes: 0.09 10*3/uL — ABNORMAL HIGH (ref 0.00–0.07)
Basophils Absolute: 0.1 10*3/uL (ref 0.0–0.1)
Basophils Relative: 0 %
Eosinophils Absolute: 0.4 10*3/uL (ref 0.0–0.5)
Eosinophils Relative: 2 %
HCT: 44.2 % (ref 39.0–52.0)
Hemoglobin: 14.3 g/dL (ref 13.0–17.0)
Immature Granulocytes: 0 %
Lymphocytes Relative: 10 %
Lymphs Abs: 2.1 10*3/uL (ref 0.7–4.0)
MCH: 26.5 pg (ref 26.0–34.0)
MCHC: 32.4 g/dL (ref 30.0–36.0)
MCV: 81.9 fL (ref 80.0–100.0)
Monocytes Absolute: 1.1 10*3/uL — ABNORMAL HIGH (ref 0.1–1.0)
Monocytes Relative: 5 %
Neutro Abs: 18.7 10*3/uL — ABNORMAL HIGH (ref 1.7–7.7)
Neutrophils Relative %: 83 %
Platelets: 259 10*3/uL (ref 150–400)
RBC: 5.4 MIL/uL (ref 4.22–5.81)
RDW: 15.5 % (ref 11.5–15.5)
WBC: 22.4 10*3/uL — ABNORMAL HIGH (ref 4.0–10.5)
nRBC: 0 % (ref 0.0–0.2)

## 2020-08-25 LAB — URINALYSIS, ROUTINE W REFLEX MICROSCOPIC
Bilirubin Urine: NEGATIVE
Glucose, UA: NEGATIVE mg/dL
Hgb urine dipstick: NEGATIVE
Ketones, ur: NEGATIVE mg/dL
Leukocytes,Ua: NEGATIVE
Nitrite: NEGATIVE
Protein, ur: NEGATIVE mg/dL
Specific Gravity, Urine: 1.012 (ref 1.005–1.030)
pH: 7 (ref 5.0–8.0)

## 2020-08-25 LAB — RESP PANEL BY RT-PCR (FLU A&B, COVID) ARPGX2
Influenza A by PCR: NEGATIVE
Influenza B by PCR: NEGATIVE
SARS Coronavirus 2 by RT PCR: NEGATIVE

## 2020-08-25 LAB — LIPASE, BLOOD: Lipase: 37 U/L (ref 11–51)

## 2020-08-25 MED ORDER — ENOXAPARIN SODIUM 30 MG/0.3ML ~~LOC~~ SOLN
30.0000 mg | SUBCUTANEOUS | Status: DC
  Filled 2020-08-25: qty 0.3

## 2020-08-25 MED ORDER — MORPHINE SULFATE (PF) 4 MG/ML IV SOLN
4.0000 mg | Freq: Once | INTRAVENOUS | Status: AC
  Administered 2020-08-25: 4 mg via INTRAVENOUS
  Filled 2020-08-25: qty 1

## 2020-08-25 MED ORDER — ONDANSETRON HCL 4 MG/2ML IJ SOLN: 4.0000 mg | Freq: Four times a day (QID) | INTRAMUSCULAR | Status: DC | PRN

## 2020-08-25 MED ORDER — SODIUM CHLORIDE 0.9 % IV BOLUS
500.0000 mL | Freq: Once | INTRAVENOUS | Status: AC
  Administered 2020-08-25: 500 mL via INTRAVENOUS

## 2020-08-25 MED ORDER — FAMOTIDINE IN NACL 20-0.9 MG/50ML-% IV SOLN
20.0000 mg | Freq: Once | INTRAVENOUS | Status: AC
  Administered 2020-08-25: 20 mg via INTRAVENOUS
  Filled 2020-08-25: qty 50

## 2020-08-25 MED ORDER — DIPHENHYDRAMINE HCL 25 MG PO CAPS
25.0000 mg | ORAL_CAPSULE | Freq: Four times a day (QID) | ORAL | Status: DC | PRN
Start: 2020-08-25 — End: 2020-08-26

## 2020-08-25 MED ORDER — ONDANSETRON 4 MG PO TBDP: 4.0000 mg | ORAL_TABLET | Freq: Four times a day (QID) | ORAL | Status: DC | PRN

## 2020-08-25 MED ORDER — DIPHENHYDRAMINE HCL 50 MG/ML IJ SOLN: 25.0000 mg | Freq: Four times a day (QID) | INTRAMUSCULAR | Status: DC | PRN

## 2020-08-25 MED ORDER — IOHEXOL 300 MG/ML  SOLN
100.0000 mL | Freq: Once | INTRAMUSCULAR | Status: AC | PRN
  Administered 2020-08-25: 100 mL via INTRAVENOUS

## 2020-08-25 MED ORDER — PIPERACILLIN-TAZOBACTAM 3.375 G IVPB
3.3750 g | Freq: Three times a day (TID) | INTRAVENOUS | Status: DC
  Administered 2020-08-25 – 2020-08-26 (×2): 3.375 g via INTRAVENOUS
  Filled 2020-08-25 (×2): qty 50

## 2020-08-25 MED ORDER — HYDROMORPHONE HCL 1 MG/ML IJ SOLN: 1.0000 mg | INTRAMUSCULAR | Status: DC | PRN

## 2020-08-25 MED ORDER — OXYCODONE HCL 5 MG PO TABS: 5.0000 mg | ORAL_TABLET | ORAL | Status: DC | PRN

## 2020-08-25 MED ORDER — POTASSIUM CHLORIDE IN NACL 20-0.9 MEQ/L-% IV SOLN
INTRAVENOUS | Status: DC
  Filled 2020-08-25 (×3): qty 1000

## 2020-08-25 MED ORDER — FAMOTIDINE IN NACL 20-0.9 MG/50ML-% IV SOLN
20.0000 mg | Freq: Two times a day (BID) | INTRAVENOUS | Status: DC
  Administered 2020-08-25 – 2020-08-26 (×2): 20 mg via INTRAVENOUS
  Filled 2020-08-25 (×2): qty 50

## 2020-08-25 MED ORDER — PIPERACILLIN-TAZOBACTAM 3.375 G IVPB 30 MIN
3.3750 g | Freq: Once | INTRAVENOUS | Status: AC
  Administered 2020-08-25: 3.375 g via INTRAVENOUS
  Filled 2020-08-25: qty 50

## 2020-08-25 NOTE — ED Triage Notes (Signed)
Ems brings pt in from home for abdominal pain. Pt reports left sided abdominal pain that is going into his back. Pt reports nausea.

## 2020-08-25 NOTE — ED Notes (Signed)
Consult to General Surgery@14 :15pm.

## 2020-08-25 NOTE — ED Notes (Signed)
Pt. Documented in error see above note in chart. 

## 2020-08-25 NOTE — ED Provider Notes (Signed)
[facility] DEPT Provider Note   CSN: [id number] Arrival date & time: 08/25/20  1126     History Chief Complaint  Patient presents with  . Abdominal Pain    Shafin Pollio is a 40 y.o. male.  40 year old male with prior medical history detailed below presents for evaluation of left upper quadrant abdominal pain.  Patient reports onset abdominal pain over the course of today.  He denies fever.  He reports mild nausea.  He denies vomiting.  He denies prior abdominal surgery.  He denies urinary or bowel movement changes.  The history is provided by the patient and medical records.  Abdominal Pain Pain location:  LUQ Pain quality: aching   Pain radiates to:  Does not radiate Pain severity:  Moderate Onset quality:  Gradual Duration:  12 hours Timing:  Constant Progression:  Worsening Chronicity:  New Relieved by:  Nothing Worsened by:  Nothing      History reviewed. No pertinent past medical history.  There are no problems to display for this patient.   Past Surgical History:  Procedure Laterality Date  . HIP SURGERY         No family history on file.  Social History   Tobacco Use  . Smoking status: Current Every Day Smoker    Types: Cigarettes  . Smokeless tobacco: Never Used  Substance Use Topics  . Alcohol use: Yes  . Drug use: No    Home Medications Prior to Admission medications   Medication Sig Start Date End Date Taking? Authorizing Provider  diclofenac (VOLTAREN) 75 MG EC tablet Take 75 mg by mouth daily at 8 pm.   Yes [provider]  gabapentin (NEURONTIN) 300 MG capsule Take 900 mg by mouth 3 (three) times daily.   Yes [provider]  hydrOXYzine (ATARAX/VISTARIL) 25 MG tablet Take 1 tablet (25 mg total) by mouth every 6 (six) hours. 09/11/19   Ok Edwards, PA-C    Allergies    Patient has no known allergies.  Review of Systems   Review of Systems  Gastrointestinal: Positive for abdominal  pain.  All other systems reviewed and are negative.   Physical Exam Updated Vital Signs BP 130/83 (BP Location: Right Arm)   Pulse 72   Temp 98.6 F (37 C)   Resp 16   Ht 6' (1.829 m)   Wt 68 kg   SpO2 100%   BMI 20.34 kg/m   Physical Exam Vitals and nursing note reviewed.  Constitutional:      General: He is not in acute distress.    Appearance: He is well-developed.  HENT:     Head: Normocephalic and atraumatic.  Eyes:     Conjunctiva/sclera: Conjunctivae normal.     Pupils: Pupils are equal, round, and reactive to light.  Cardiovascular:     Rate and Rhythm: Normal rate and regular rhythm.     Heart sounds: Normal heart sounds.  Pulmonary:     Effort: Pulmonary effort is normal. No respiratory distress.     Breath sounds: Normal breath sounds.  Abdominal:     General: There is no distension.     Palpations: Abdomen is soft.     Tenderness: There is abdominal tenderness in the left upper quadrant.  Musculoskeletal:        General: No deformity. Normal range of motion.     Cervical back: Normal range of motion and neck supple.  Skin:    General: Skin is warm and dry.  Neurological:     Mental Status: He is alert and oriented to person, place, and time.     ED Results / Procedures / Treatments   Labs (all labs ordered are listed, but only abnormal results are displayed) Labs Reviewed  COMPREHENSIVE METABOLIC PANEL - Abnormal; Notable for the following components:      Result Value   Glucose, Bld 102 (*)    ALT 47 (*)    All other components within normal limits  CBC WITH DIFFERENTIAL/PLATELET - Abnormal; Notable for the following components:   WBC 22.4 (*)    Neutro Abs 18.7 (*)    Monocytes Absolute 1.1 (*)    Abs Immature Granulocytes 0.09 (*)    All other components within normal limits  URINALYSIS, ROUTINE W REFLEX MICROSCOPIC - Abnormal; Notable for the following components:   Color, Urine STRAW (*)    All other components within normal limits   LIPASE, BLOOD    EKG EKG Interpretation  Date/Time:  Sunday August 25 2020 11:37:52 EDT Ventricular Rate:  76 PR Interval:  172 QRS Duration: 112 QT Interval:  372 QTC Calculation: 419 R Axis:   71 Text Interpretation: Sinus rhythm LAE, consider biatrial enlargement RSR' in V1 or V2, probably normal variant Left ventricular hypertrophy Confirmed by Dene Gentry [phone]) on 08/25/2020 12:09:01 PM   Radiology No results found.  Procedures Procedures   Medications Ordered in ED Medications  morphine 4 MG/ML injection 4 mg (4 mg Intravenous Given 08/25/20 1207)  famotidine (PEPCID) IVPB 20 mg premix (20 mg Intravenous New Bag/Given 08/25/20 1204)  sodium chloride 0.9 % bolus 500 mL (500 mLs Intravenous New Bag/Given 08/25/20 1205)  iohexol (OMNIPAQUE) 300 MG/ML solution 100 mL (100 mLs Intravenous Contrast Given 08/25/20 1312)    ED Course  I have reviewed the triage vital signs and the nursing notes.  Pertinent labs & imaging results that were available during my care of the patient were reviewed by me and considered in my medical decision making (see chart for details).    MDM Rules/Calculators/A&P                          MDM  MSE complete  Herbie Lehrmann was evaluated in Emergency Department on 08/25/2020 for the symptoms described in the history of present illness. He was evaluated in the context of the global COVID-19 pandemic, which necessitated consideration that the patient might be at risk for infection with the SARS-CoV-2 virus that causes COVID-19. Institutional protocols and algorithms that pertain to the evaluation of patients at risk for COVID-19 are in a state of rapid change based on information released by regulatory bodies including the CDC and federal and state organizations. These policies and algorithms were followed during the patient's care in the ED.  Patient is presenting with significant epigastric pain.  Onset of symptoms over the course of this  morning.  Patient with noted elevation in white count.  CT imaging suggest possible early appendicitis.  Repeat abdominal exam demonstrates improved epigastric discomfort but now the patient is reporting significant right lower quadrant tenderness with palpation.  CT plan discussed with patient and his wife.  Dr. Ninfa Linden of surgery is aware of case and CT imaging results.  Patient will be evaluated by surgery for likely admission for possible early appendicitis.   Final Clinical Impression(s) / ED Diagnoses Final diagnoses:  Right lower quadrant abdominal pain    Rx / DC Orders ED Discharge Orders  None       Valarie Merino, MD 08/25/20 [phone]

## 2020-08-25 NOTE — H&P (Signed)
Kenneth Blackwell is an 40 y.o. male.   Chief Complaint: epigastric abdominal pain HPI: This is a pleasant 40 year old gentleman who presents to the emergency department with abdominal pain.  He reports that he started having sharp, moderate to severe epigastric abdominal pain with burning around 6 AM this morning.  It moved to the left upper quadrant.  He had minimal nausea no vomiting.  He reports he moves his bowels normally.  He denies fever. He has a herniated disc in his lower back and has been on Neurontin as well as Voltaren daily and earlier this week received a steroid injection in his lower lumbar spine.  He was found to have a white blood count of 22,000.  He underwent a CT scan of the abdomen and pelvis.  He was found to have borderline dilation of the appendix with mild mucosal hyperenhancement but no regional inflammatory changes or fluid.  He was also seen to have a 2.9 cm enhancing left retroperitoneal mass near the distal duodenum of uncertain etiology.  Considerations include a neurogenic tumor or pheo.  On further questioning, he has 1 brother with a paraganglioma and another brother with some sort of ganglion tumor.  His grandmother had pancreatic cancer.  He currently denies pain in his lower abdomen.  He has no history of tachycardia, dizziness, lightheadedness, night sweats, or issues with his blood pressure.  History reviewed. No pertinent past medical history.  Past Surgical History:  Procedure Laterality Date  . HIP SURGERY      No family history on file. Social History:  reports that he has been smoking cigarettes. He has never used smokeless tobacco. He reports current alcohol use. He reports that he does not use drugs.  Allergies: No Known Allergies  (Not in a hospital admission)   Results for orders placed or performed during the hospital encounter of 08/25/20 (from the past 48 hour(s))  Comprehensive metabolic panel     Status: Abnormal   Collection Time: 08/25/20  11:45 AM  Result Value Ref Range   Sodium 139 135 - 145 mmol/L   Potassium 4.2 3.5 - 5.1 mmol/L   Chloride 106 98 - 111 mmol/L   CO2 26 22 - 32 mmol/L   Glucose, Bld 102 (H) 70 - 99 mg/dL    Comment: Glucose reference range applies only to samples taken after fasting for at least 8 hours.   BUN 10 6 - 20 mg/dL   Creatinine, Ser 1.18 0.61 - 1.24 mg/dL   Calcium 9.6 8.9 - 10.3 mg/dL   Total Protein 7.6 6.5 - 8.1 g/dL   Albumin 4.4 3.5 - 5.0 g/dL   AST 28 15 - 41 U/L   ALT 47 (H) 0 - 44 U/L   Alkaline Phosphatase 62 38 - 126 U/L   Total Bilirubin 0.7 0.3 - 1.2 mg/dL   GFR, Estimated >60 >60 mL/min    Comment: (NOTE) Calculated using the CKD-EPI Creatinine Equation (2021)    Anion gap 7 5 - 15    Comment: Performed at [facility], [place] [address], [place], [place] [address]  Lipase, blood     Status: None   Collection Time: 08/25/20 11:45 AM  Result Value Ref Range   Lipase 37 11 - 51 U/L    Comment: Performed at [facility], [place] [address] [place], [place] [address]  CBC with Differential     Status: Abnormal   Collection Time: 08/25/20 11:45 AM  Result Value Ref Range  WBC 22.4 (H) 4.0 - 10.5 K/uL   RBC 5.40 4.22 - 5.81 MIL/uL   Hemoglobin 14.3 13.0 - 17.0 g/dL   HCT 44.2 39.0 - 52.0 %   MCV 81.9 80.0 - 100.0 fL   MCH 26.5 26.0 - 34.0 pg   MCHC 32.4 30.0 - 36.0 g/dL   RDW 15.5 11.5 - 15.5 %   Platelets 259 150 - 400 K/uL   nRBC 0.0 0.0 - 0.2 %   Neutrophils Relative % 83 %   Neutro Abs 18.7 (H) 1.7 - 7.7 K/uL   Lymphocytes Relative 10 %   Lymphs Abs 2.1 0.7 - 4.0 K/uL   Monocytes Relative 5 %   Monocytes Absolute 1.1 (H) 0.1 - 1.0 K/uL   Eosinophils Relative 2 %   Eosinophils Absolute 0.4 0.0 - 0.5 K/uL   Basophils Relative 0 %   Basophils Absolute 0.1 0.0 - 0.1 K/uL   Immature Granulocytes 0 %   Abs Immature Granulocytes 0.09 (H) 0.00 - 0.07 K/uL    Comment: Performed at [facility], [place] [address]  [place], [place] [address]  Urinalysis, Routine w reflex microscopic Urine, Clean Catch     Status: Abnormal   Collection Time: 08/25/20 11:46 AM  Result Value Ref Range   Color, Urine STRAW (A) YELLOW   APPearance CLEAR CLEAR   Specific Gravity, Urine 1.012 1.005 - 1.030   pH 7.0 5.0 - 8.0   Glucose, UA NEGATIVE NEGATIVE mg/dL   Hgb urine dipstick NEGATIVE NEGATIVE   Bilirubin Urine NEGATIVE NEGATIVE   Ketones, ur NEGATIVE NEGATIVE mg/dL   Protein, ur NEGATIVE NEGATIVE mg/dL   Nitrite NEGATIVE NEGATIVE   Leukocytes,Ua NEGATIVE NEGATIVE    Comment: Performed at [place] [address], [place], [place] [address]   CT ABDOMEN PELVIS W CONTRAST  Result Date: 08/25/2020 CLINICAL DATA:  Acute left abdominal pain, nausea EXAM: CT ABDOMEN AND PELVIS WITH CONTRAST TECHNIQUE: Multidetector CT imaging of the abdomen and pelvis was performed using the standard protocol following bolus administration of intravenous contrast. CONTRAST:  165mL OMNIPAQUE IOHEXOL 300 MG/ML  SOLN COMPARISON:  None. FINDINGS: Lower chest: No pleural or pericardial effusion. Visualized lung bases clear. Hepatobiliary: No focal liver abnormality is seen. No gallstones, gallbladder wall thickening, or biliary dilatation. Pancreas: Unremarkable. No pancreatic ductal dilatation or surrounding inflammatory changes. Spleen: Normal in size without focal abnormality. Adrenals/Urinary Tract: Adrenal glands are unremarkable. Kidneys are normal, without renal calculi, focal lesion, or hydronephrosis. Bladder is unremarkable. Stomach/Bowel: Stomach is nondistended.  Small bowel decompressed. Appendix is retrocecal, 8 mm diameter, with mild mucosal hyperenhancement but no regional inflammatory change or fluid collections. The colon is nondilated, unremarkable. Vascular/Lymphatic: Minimal aortic calcified plaque. No significant vascular findings are present. No enlarged abdominal or pelvic lymph nodes. Reproductive: Prostate is  unremarkable. Other: 2.9 cm well-circumscribed enhancing left retroperitoneal mass just inferior and adjacent to the distal duodenum. No ascites. No free air. Musculoskeletal: Orthopedic pins across the right femoral neck. No fracture or worrisome bone lesion. IMPRESSION: 1. 2.9 cm enhancing left retroperitoneal mass, in proximity to the distal duodenum. Primary considerations include neurogenic tumor, extra-adrenal pheochromocytoma, less likely gastrointestinal stromal tumor or neuroendocrine tumor. Findings were reviewed with Dr. Kris Hartmann, who concurs. 2. Borderline dilated appendix with mild mucosal hyperenhancement but no regional inflammatory change or fluid collections. Correlate with any clinical or laboratory evidence of appendicitis. Aortic Atherosclerosis (ICD10-I70.0). Electronically Signed   By: Lucrezia Europe M.D.   On: 08/25/2020 13:48  Review of Systems  All other systems reviewed and are negative.   Blood pressure 134/83, pulse 68, temperature 98.6 F (37 C), resp. rate 17, height 6' (1.829 m), weight 68 kg, SpO2 100 %. Physical Exam Constitutional:      General: He is not in acute distress.    Appearance: He is well-developed. He is not ill-appearing or diaphoretic.  HENT:     Head: Normocephalic and atraumatic.     Mouth/Throat:     Pharynx: Oropharynx is clear.  Eyes:     General: No scleral icterus. Cardiovascular:     Rate and Rhythm: Normal rate and regular rhythm.     Heart sounds: Normal heart sounds.  Pulmonary:     Effort: Pulmonary effort is normal. No respiratory distress.     Breath sounds: Normal breath sounds.  Abdominal:     General: Abdomen is flat. There is no distension.     Palpations: Abdomen is soft. There is no hepatomegaly or splenomegaly.     Tenderness: There is abdominal tenderness in the epigastric area.     Hernia: No hernia is present.     Comments: There is only mild tenderness with guarding in epigastrium and left upper quadrant.  There are no  palpable masses.  There is minimal lower abdominal tenderness and no frank peritonitis  Skin:    General: Skin is warm and dry.     Coloration: Skin is not jaundiced.  Neurological:     General: No focal deficit present.     Mental Status: He is alert.  Psychiatric:        Mood and Affect: Mood normal.        Behavior: Behavior normal.      Assessment/Plan Abdominal pain of uncertain etiology  I have reviewed the CAT scan of the abdomen and pelvis.  This certainly may represent early appendicitis but there are no appendicoliths and he is minimally tender.  This also could represent some sort of ulcer given his long-term use of Voltaren and a steroid injections for the herniated disc.  The etiology of the retroperitoneal mass remains uncertain but he does have a significant family history.  I discussed this in detail with the patient and his wife.  I recommend we manage this conservatively for now with IV antibiotics and serial abdominal exams.  I have also reached out to one of our surgical oncologist as to further work-up of the retroperitoneal mass.  Certainly, if his condition worsens, we would reconsider laparoscopy and appendectomy.  We will repeat his CBC in the morning.  We will also see if the MRIs of his lower back can be reviewed to determine whether or not they got high enough to evaluate the area of the retroperitoneal mass which I currently doubt. He and his wife are in agreement with the plans.  Coralie Keens, MD 08/25/2020, 3:41 PM

## 2020-08-26 LAB — CBC
HCT: 40.6 % (ref 39.0–52.0)
Hemoglobin: 13 g/dL (ref 13.0–17.0)
MCH: 26.1 pg (ref 26.0–34.0)
MCHC: 32 g/dL (ref 30.0–36.0)
MCV: 81.4 fL (ref 80.0–100.0)
Platelets: 251 10*3/uL (ref 150–400)
RBC: 4.99 MIL/uL (ref 4.22–5.81)
RDW: 15.5 % (ref 11.5–15.5)
WBC: 14.3 10*3/uL — ABNORMAL HIGH (ref 4.0–10.5)
nRBC: 0 % (ref 0.0–0.2)

## 2020-08-26 LAB — BASIC METABOLIC PANEL
Anion gap: 6 (ref 5–15)
BUN: 9 mg/dL (ref 6–20)
CO2: 26 mmol/L (ref 22–32)
Calcium: 9.4 mg/dL (ref 8.9–10.3)
Chloride: 109 mmol/L (ref 98–111)
Creatinine, Ser: 1.31 mg/dL — ABNORMAL HIGH (ref 0.61–1.24)
GFR, Estimated: >60 mL/min (ref >60)
Glucose, Bld: 110 mg/dL — ABNORMAL HIGH (ref 70–99)
Potassium: 4.6 mmol/L (ref 3.5–5.1)
Sodium: 141 mmol/L (ref 135–145)

## 2020-08-26 LAB — HIV ANTIBODY (ROUTINE TESTING W REFLEX): HIV Screen 4th Generation wRfx: NONREACTIVE

## 2020-08-26 MED ORDER — AMOXICILLIN-POT CLAVULANATE 875-125 MG PO TABS: 1.0000 | ORAL_TABLET | Freq: Two times a day (BID) | ORAL | 0 refills | Status: DC

## 2020-08-26 MED ORDER — ACETAMINOPHEN 500 MG PO TABS: 1000.0000 mg | ORAL_TABLET | Freq: Four times a day (QID) | ORAL | Status: DC | PRN

## 2020-08-26 MED ORDER — ACETAMINOPHEN 500 MG PO TABS: 1000.0000 mg | ORAL_TABLET | Freq: Three times a day (TID) | ORAL | 0 refills | Status: DC | PRN

## 2020-08-26 MED ORDER — OXYCODONE HCL 5 MG PO TABS: 5.0000 mg | ORAL_TABLET | Freq: Four times a day (QID) | ORAL | 0 refills | Status: DC | PRN

## 2020-08-26 MED ORDER — OMEPRAZOLE 40 MG PO CPDR: 40.0000 mg | DELAYED_RELEASE_CAPSULE | Freq: Every day | ORAL | 1 refills | Status: DC

## 2020-08-26 MED ORDER — ONDANSETRON 4 MG PO TBDP: 4.0000 mg | ORAL_TABLET | Freq: Four times a day (QID) | ORAL | 0 refills | Status: DC | PRN

## 2020-08-26 NOTE — Progress Notes (Signed)
Subjective: CC: Patient previous epigastric and left upper quadrant pain has resolved.  No longer having any nausea.  Denies any abdominal pain this morning, including no RLQ abdominal pain.  Tolerating clear liquid diet without associated nausea or emesis.  Passing flatus.  No BM since admission.  Voiding without difficulty.  Mobilizing well. Patients wife at bedside.    Objective: Vital signs in last 24 hours: Temp:  [98 F (36.7 C)-99 F (37.2 C)] 98.2 F (36.8 C) (04/25 0603) Pulse Rate:  [57-77] 66 (04/25 0603) Resp:  [15-20] 16 (04/25 0603) BP: (116-150)/(60-91) 127/85 (04/25 0603) SpO2:  [100 %] 100 % (04/25 0603) Weight:  [68 kg] 68 kg (04/24 1134) Last BM Date: 08/24/20  Intake/Output from previous day: 04/24 0701 - 04/25 0700 In: 1460.6 [P.O.:120; I.V.:1190.3; IV Piggyback:150.3] Out: 0  Intake/Output this shift: No intake/output data recorded.  Blood pressure 127/85, pulse 66, temperature 98.2 F (36.8 C), temperature source Oral, resp. rate 16, height 6' (1.829 m), weight 68 kg, SpO2 100 %. PE: Gen:  Alert, NAD, pleasant Card:  RRR, no M/G/R heard Pulm:  CTAB, no W/R/R, effort normal Abd: Soft, NT/ND, +BS, no mass palpated. No peritonitis.  Ext:  No LE edema  Psych: A&Ox3  Skin: no rashes noted, warm and dry   Lab Results:  Recent Labs    08/25/20 1145 08/26/20 0451  WBC 22.4* 14.3*  HGB 14.3 13.0  HCT 44.2 40.6  PLT 259 251   BMET Recent Labs    08/25/20 1145 08/26/20 0451  NA 139 141  K 4.2 4.6  CL 106 109  CO2 26 26  GLUCOSE 102* 110*  BUN 10 9  CREATININE 1.18 1.31*  CALCIUM 9.6 9.4   PT/INR No results for input(s): LABPROT, INR in the last 72 hours. CMP     Component Value Date/Time   NA 141 08/26/2020 0451   K 4.6 08/26/2020 0451   CL 109 08/26/2020 0451   CO2 26 08/26/2020 0451   GLUCOSE 110 (H) 08/26/2020 0451   BUN 9 08/26/2020 0451   CREATININE 1.31 (H) 08/26/2020 0451   CALCIUM 9.4 08/26/2020 0451   PROT 7.6  08/25/2020 1145   ALBUMIN 4.4 08/25/2020 1145   AST 28 08/25/2020 1145   ALT 47 (H) 08/25/2020 1145   ALKPHOS 62 08/25/2020 1145   BILITOT 0.7 08/25/2020 1145   GFRNONAA >60 08/26/2020 0451   Lipase     Component Value Date/Time   LIPASE 37 08/25/2020 1145       Studies/Results: CT ABDOMEN PELVIS W CONTRAST  Result Date: 08/25/2020 CLINICAL DATA:  Acute left abdominal pain, nausea EXAM: CT ABDOMEN AND PELVIS WITH CONTRAST TECHNIQUE: Multidetector CT imaging of the abdomen and pelvis was performed using the standard protocol following bolus administration of intravenous contrast. CONTRAST:  160mL OMNIPAQUE IOHEXOL 300 MG/ML  SOLN COMPARISON:  None. FINDINGS: Lower chest: No pleural or pericardial effusion. Visualized lung bases clear. Hepatobiliary: No focal liver abnormality is seen. No gallstones, gallbladder wall thickening, or biliary dilatation. Pancreas: Unremarkable. No pancreatic ductal dilatation or surrounding inflammatory changes. Spleen: Normal in size without focal abnormality. Adrenals/Urinary Tract: Adrenal glands are unremarkable. Kidneys are normal, without renal calculi, focal lesion, or hydronephrosis. Bladder is unremarkable. Stomach/Bowel: Stomach is nondistended.  Small bowel decompressed. Appendix is retrocecal, 8 mm diameter, with mild mucosal hyperenhancement but no regional inflammatory change or fluid collections. The colon is nondilated, unremarkable. Vascular/Lymphatic: Minimal aortic calcified plaque. No significant vascular findings are present.  No enlarged abdominal or pelvic lymph nodes. Reproductive: Prostate is unremarkable. Other: 2.9 cm well-circumscribed enhancing left retroperitoneal mass just inferior and adjacent to the distal duodenum. No ascites. No free air. Musculoskeletal: Orthopedic pins across the right femoral neck. No fracture or worrisome bone lesion. IMPRESSION: 1. 2.9 cm enhancing left retroperitoneal mass, in proximity to the distal duodenum.  Primary considerations include neurogenic tumor, extra-adrenal pheochromocytoma, less likely gastrointestinal stromal tumor or neuroendocrine tumor. Findings were reviewed with Dr. Kris Hartmann, who concurs. 2. Borderline dilated appendix with mild mucosal hyperenhancement but no regional inflammatory change or fluid collections. Correlate with any clinical or laboratory evidence of appendicitis. Aortic Atherosclerosis (ICD10-I70.0). Electronically Signed   By: Lucrezia Europe M.D.   On: 08/25/2020 13:48    Anti-infectives: Anti-infectives (From admission, onward)   Start     Dose/Rate Route Frequency Ordered Stop   08/25/20 2100  piperacillin-tazobactam (ZOSYN) IVPB 3.375 g        3.375 g 12.5 mL/hr over 240 Minutes Intravenous Every 8 hours 08/25/20 1643     08/25/20 1430  piperacillin-tazobactam (ZOSYN) IVPB 3.375 g        3.375 g 100 mL/hr over 30 Minutes Intravenous  Once 08/25/20 1418 08/25/20 1529       Assessment/Plan Abdominal pain of uncertain etiology - CT scan 4/24 w/ 2.9 cm enhancing left retroperitoneal mass, in proximity to the distal duodenum. Primary considerations include neurogenic tumor, extra-adrenal pheochromocytoma, less likely gastrointestinal stromal tumor or neuroendocrine tumor. This also showed borderline dilated appendix with mild mucosal hyperenhancement but no regional inflammatory change or fluid collections.  - His CT scan could represent early appendicitis but there are no appendicoliths and he is NT on exam today. He is currently on IV Zosyn. WBC 22.4 > 14.3. Afebrile. Pain resolved. Continue with conservative tx of IV abx. Adv diet today.  - The etiology of the retroperitoneal mass remains uncertain but he does have a significant family history (1 brother with a paraganglioma and another brother with some sort of ganglion tumor.  His grandmother had pancreatic cancer).  Will have our surgical oncologist review images. He had an MRI of lumbar spine 03/26/20 that does not  mention any mass but unable to see the images. Will need close follow up for this.  - If his condition worsens, we would reconsider diagnotic  laparoscopy and appendectomy.   FEN - FLD, IVF VTE - SCDs, Lovenox ID - Zosyn   LOS: 0 days    Jillyn Ledger , [facility] 08/26/2020, 8:39 AM Please see Amion for pager number during day hours 7:00am-4:30pm

## 2020-08-26 NOTE — Discharge Instructions (Signed)
Your CT scan 4/24 showed a 2.9 cm enhancing left retroperitoneal mass, in proximity to the distal duodenum. This will need follow up with our surgical oncologist in the office. We are also sending a referral to genetics given your family history of cancers.  Your CT scan also showed a borderline dilated appendix with mild mucosal hyperenhancement. You are being treated with a 10 day course of oral antibiotics. Please take to completion. Please read handout for return precautions as we discussed prior to discharge.  Please stop taking the diclofenac. Begin taking Prilosec as prescribed. Recommend following the eating plan attached to see if this helps with your symptoms.

## 2020-08-26 NOTE — Discharge Summary (Signed)
Patient ID: Kenneth Blackwell [id number] 08/23/1980 40 y.o.  Admit date: 08/25/2020 Discharge date: 08/26/2020  Admitting Diagnosis: Abdominal pain of uncertain etiology  Discharge Diagnosis Abdominal pain of uncertain etiology Retroperitoneal mass  Consultants None   Procedures None   Hospital Course:  This is a pleasant 40 year old gentleman who presented to the emergency department with abdominal pain.  He reports that he started having sharp, moderate to severe epigastric abdominal pain with burning around 6 AM on morning of presentation.  It moved to the left upper quadrant.  He had minimal nausea but no vomiting.  He reports he moves his bowels normally.  He denies fever. He has a herniated disc in his lower back and has been on Neurontin as well as Voltaren daily and earlier this week received a steroid injection in his lower lumbar spine.  He was found to have a white blood count of 22,000.  He underwent a CT scan of the abdomen and pelvis.  He was found to have borderline dilation of the appendix with mild mucosal hyperenhancement but no regional inflammatory changes or fluid.  He was also seen to have a 2.9 cm enhancing left retroperitoneal mass near the distal duodenum of uncertain etiology.  Considerations include a neurogenic tumor or pheo.  On further questioning, he has 1 brother with a paraganglioma and another brother with some sort of ganglion tumor.  His grandmother had pancreatic cancer.  He denied pain in his lower abdomen.   The etiology of the retroperitoneal mass is uncertain but he does have a significant family history as mentioned above. We reviewed case/images with one of our surgical oncologist. It was felt no emergent surgical exploration was required during admission for this. He was arranged close follow up with our surgical oncologist in the office. We also referred him to genetics given strong family hx as mentioned above.   It was felt his CT scan could  could represent early appendicitis but there are no appendicoliths and he is NT to lower abdominal exam. His WBC was elevated to 22.4. He was started on abx. It was felt some of his LUQ abdominal pain could be 2/2 to PUD given prolonged NSAID use. He was started on IV pepcid. He was admitted to the general surgery service. On HD1 patient was afebrile, WBC 22.4 > 14.3, pain resolved, he was NT on exam. Patient tolerated diet advancement without any increased pain, n/v. He was felt stable for discharge on PPI and abx. He was recommended to stop taking NSAIDs. He was arranged follow up as mentioned above. He reported he did not need a note for work.   Allergies as of 08/26/2020   No Known Allergies     Medication List    STOP taking these medications   diclofenac 75 MG EC tablet Commonly known as: VOLTAREN     TAKE these medications   acetaminophen 500 MG tablet Commonly known as: TYLENOL Take 2 tablets (1,000 mg total) by mouth every 8 (eight) hours as needed for fever.   amoxicillin-clavulanate 875-125 MG tablet Commonly known as: AUGMENTIN Take 1 tablet by mouth every 12 (twelve) hours.   gabapentin 300 MG capsule Commonly known as: NEURONTIN Take 900 mg by mouth 3 (three) times daily.   hydrOXYzine 25 MG tablet Commonly known as: ATARAX/VISTARIL Take 1 tablet (25 mg total) by mouth every 6 (six) hours.   omeprazole 40 MG capsule Commonly known as: PRILOSEC Take 1 capsule (40 mg total) by mouth daily.  ondansetron 4 MG disintegrating tablet Commonly known as: ZOFRAN-ODT Take 1 tablet (4 mg total) by mouth every 6 (six) hours as needed for nausea.   oxyCODONE 5 MG immediate release tablet Commonly known as: Oxy IR/ROXICODONE Take 1 tablet (5 mg total) by mouth every 6 (six) hours as needed.         Follow-up Information    Dwan Bolt, MD. Go on 09/10/2020.   Specialty: General Surgery Why: at 930am for follow up. Please arrive 30 minutes prior to your appointment  for paperwork. Please bring a copy of your photo ID and insurance card. We are also sending a referral to genetics.  Contact information: [address] [place] [address] [phone]        Vernie Shanks, MD Follow up.   Specialty: Family Medicine Contact information: [place] [address] [phone]               Signed: Alferd Apa, [facility] 08/26/2020, 3:59 PM Please see Amion for pager number during day hours 7:00am-4:30pm

## 2020-08-29 ENCOUNTER — Telehealth: Payer: Self-pay | Admitting: Genetic Counselor

## 2020-08-29 NOTE — Telephone Encounter (Signed)
Received a genetic counseling referral from Dr. Zenia Resides at [place] for fhx of pancreatic cancer. Kenneth Blackwell has been cld and scheduled to see [name] on 5/9 at 10am. Pt aware to arrive 15 minutes early.

## 2020-09-09 ENCOUNTER — Inpatient Hospital Stay: Payer: 59 | Attending: Genetic Counselor | Admitting: Genetic Counselor

## 2020-09-09 ENCOUNTER — Inpatient Hospital Stay: Payer: 59

## 2020-09-09 ENCOUNTER — Encounter: Payer: Self-pay | Admitting: Genetic Counselor

## 2020-09-09 ENCOUNTER — Other Ambulatory Visit: Payer: Self-pay

## 2020-09-09 DIAGNOSIS — F17210 Nicotine dependence, cigarettes, uncomplicated: Secondary | ICD-10-CM

## 2020-09-09 DIAGNOSIS — Z800 Family history of malignant neoplasm of digestive organs: Secondary | ICD-10-CM | POA: Diagnosis not present

## 2020-09-09 DIAGNOSIS — Z8489 Family history of other specified conditions: Secondary | ICD-10-CM

## 2020-09-09 HISTORY — DX: Family history of other specified conditions: Z84.89

## 2020-09-09 HISTORY — DX: Family history of malignant neoplasm of digestive organs: Z80.0

## 2020-09-09 LAB — GENETIC SCREENING ORDER

## 2020-09-09 NOTE — Progress Notes (Signed)
REFERRING PROVIDER: Dwan Bolt, [address]. [place],  [place] [address]  PRIMARY PROVIDER:  Kristen Loader, FNP  PRIMARY REASON FOR VISIT:  1. Family history of familial paraganglioma   2. Family history of pheochromocytoma   3. Family history of pancreatic cancer    HISTORY OF PRESENT ILLNESS:   Kenneth Blackwell, a 40 y.o. male, was seen for a  cancer genetics consultation at the request of Dr. Zenia Resides due to a family history of cancer and tumors.  Kenneth Blackwell presents to clinic today to discuss the possibility of a hereditary predisposition to cancer, to discuss genetic testing, and to further clarify his future cancer risks, as well as potential cancer risks for family members.   Kenneth Blackwell is a 39 y.o. male with no personal history of cancer.  He presented to the emergency room approximately two weeks ago due to abdominal pain.  He underwent CT scan of the abdomen. He was seen to have a 2.9 cm enhancing left retroperitoneal mass near the distal duodenum of uncertain etiology.  Considerations include neurogenic tumor or pheochromocytoma.   CANCER HISTORY:  Oncology History   No history exists.    RISK FACTORS:  Colonoscopy: no; not examined. PSA screening: none reported Any excessive radiation exposure in the past: no  Past Medical History:  Diagnosis Date  . Family history of pancreatic cancer 09/09/2020  . Family history of pheochromocytoma 09/09/2020    Past Surgical History:  Procedure Laterality Date  . HIP SURGERY      Social History   Socioeconomic History  . Marital status: Married    Spouse name: Not on file  . Number of children: Not on file  . Years of education: Not on file  . Highest education level: Not on file  Occupational History  . Not on file  Tobacco Use  . Smoking status: Current Every Day Smoker    Types: Cigarettes  . Smokeless tobacco: Never Used  Vaping Use  . Vaping Use: Never used  Substance and Sexual Activity   . Alcohol use: Yes  . Drug use: No  . Sexual activity: Yes    Comment: has taken hydrocodone a few days ago  Other Topics Concern  . Not on file  Social History Narrative  . Not on file   Social Determinants of Health   Financial Resource Strain: Not on file  Food Insecurity: Not on file  Transportation Needs: Not on file  Physical Activity: Not on file  Stress: Not on file  Social Connections: Not on file     FAMILY HISTORY:  We obtained a detailed, 4-generation family history.  Significant diagnoses are listed below: Family History  Problem Relation Age of Onset  . Pancreatic cancer Maternal Grandmother        dx early 12s  . Other Half-Brother        paternal; paraganglioma and pheochromocytoma  . Other Half-Brother        paternal; ? paraganglioma    Kenneth Blackwell has one son and three daughters.  He has one maternal half brother and one maternal half sister, both without a cancer/tumor history.  Kenneth Blackwell mother is 18 years old and has not had cancer.  Kenneth Blackwell maternal grandmother had pancreatic cancer diagnosed in her early 71s and passed away at age 49.  His maternal grandmother's maternal uncle had a history of stomach cancer.    Kenneth Blackwell recently connected with his paternal family for the  first time.  He has two paternal half brothers and one paternal half sister.  His paternal half brother, age 26, and a history of both paraganglioma and pheochromocytoma.  Kenneth Blackwell has not heard of this half brother having genetic testing.  He reports that his other half brother has a history of a tumor, possibly paraganglioma.  His father is 53 years old and has not had cancer/tumors.  His father has a history of HTN, which is well-controlled with medication.  No other paternal family history of cancer/tumors were reported.   Kenneth Blackwell is unaware of previous family history of genetic testing for hereditary cancer risks. There is no reported Ashkenazi Jewish ancestry.  There is no known consanguinity.  GENETIC COUNSELING ASSESSMENT: Kenneth Blackwell is a 40 y.o. male with a family history of tumors/cancer and a personal history of an unknown retroperitoneal mass which is somewhat suggestive of a hereditary cancer syndrome and predisposition to cancer given the presence of rare tumors in his paternal family and pancreatic cancer in his maternal family. We, therefore, discussed and recommended the following at today's visit.   DISCUSSION: We discussed that 5 - 10% of cancer is hereditary.  Approximately 30 percent of pheochromocytomas are hereditary.  Some cases of hereditary paraganglioma and pheochromocytoma are associated with cancer associated with the SDHx genes.  There are other genes that can be associated with hereditary pheochromocytoma and paraganglioma cancer syndromes as well as pancreatic cancer syndromes.  Type of tumor/cancer risk and level of risk are gene-specific.  We discussed that testing is beneficial for several reasons, including knowing about other cancer risks, identifying potential screening and management options that may be appropriate, and to understanding if other family members could be at risk for cancer and allowing them to undergo genetic testing.  We reviewed the characteristics, features and inheritance patterns of hereditary cancer syndromes. We also discussed genetic testing, including the appropriate family members to test, the process of testing, insurance coverage and turn-around-time for results. We discussed the implications of a negative, positive, carrier and/or variant of uncertain significant result.  We recommended Kenneth Blackwell pursue genetic testing for a panel that contains genes associated with paragangliomas, pheochromocytomas, and pancreatic cancer.  The Multi-Cancer + RNA Panel offered by [facility] includes sequencing and/or deletion/duplication analysis of the following 84 genes:  AIP*, ALK, APC*, ATM*, AXIN2*, BAP1*, BARD1*,  BLM*, BMPR1A*, BRCA1*, BRCA2*, BRIP1*, CASR, CDC73*, CDH1*, CDK4, CDKN1B*, CDKN1C*, CDKN2A, CEBPA, CHEK2*, CTNNA1*, DICER1*, DIS3L2*, EGFR, EPCAM, FH*, FLCN*, GATA2*, GPC3, GREM1, HOXB13, HRAS, KIT, MAX*, MEN1*, MET, MITF, MLH1*, MSH2*, MSH3*, MSH6*, MUTYH*, NBN*, NF1*, NF2*, NTHL1*, PALB2*, PDGFRA, PHOX2B, PMS2*, POLD1*, POLE*, POT1*, PRKAR1A*, PTCH1*, PTEN*, RAD50*, RAD51C*, RAD51D*, RB1*, RECQL4, RET, RUNX1*, SDHA*, SDHAF2*, SDHB*, SDHC*, SDHD*, SMAD4*, SMARCA4*, SMARCB1*, SMARCE1*, STK11*, SUFU*, TERC, TERT, TMEM127*, Tp53*, TSC1*, TSC2*, VHL*, WRN*, and WT1.  RNA analysis is performed for * genes.  Based on Kenneth Blackwell family history of paraganglioma and pheochromocytomas and personal history of an unknown retroperiotoneal, he meets medical criteria for genetic testing. Despite that he meets criteria, he may still have an out of pocket cost. We discussed that if he has an out of pocket cost for testing, the laboratory should reach out to him to discuss self-pay and patient pay assistance program options.   We discussed that some people do not want to undergo genetic testing due to fear of genetic discrimination.  A federal law called the Genetic Information Non-Discrimination Act (GINA) of 2008 helps protect individuals against genetic discrimination based on  their genetic test results.  It impacts both health insurance and employment.  With health insurance, it protects against increased premiums, being kicked off insurance or being forced to take a test in order to be insured.  For employment it protects against hiring, firing and promoting decisions based on genetic test results.  GINA does not apply to those in the [employer], those who work for companies with less than 15 employees, and new life insurance or long-term disability insurance policies.  Health status due to a cancer diagnosis is not protected under GINA.  PLAN: After considering the risks, benefits, and limitations, Kenneth Blackwell provided  informed consent to pursue genetic testing and the blood sample was sent to [facility] for analysis of the Multi-Cancer +RNA Panel. Results should be available within approximately 3 weeks' time, at which point they will be disclosed by telephone to Kenneth Blackwell, as will any additional recommendations warranted by these results. Kenneth Blackwell will receive a summary of his genetic counseling visit and a copy of his results once available. This information will also be available in Epic.   Based on Kenneth Blackwell family history, we also recommended his paternal half brother, who was diagnosed with paraganglioma and pheochromocytoma before age 22, have genetic counseling and testing. Kenneth Blackwell can let us know if we can be of any assistance in coordinating genetic counseling and/or testing for this family member.   Lastly, we encouraged Kenneth Blackwell to remain in contact with cancer genetics annually so that we can continuously update the family history and inform him of any changes in cancer genetics and testing that may be of benefit for this family.   Mr. Rafanan questions were answered to his satisfaction today. Our contact information was provided should additional questions or concerns arise. Thank you for the referral and allowing [place] to share in the care of your patient.   Jaccob Czaplicki M. Joette Catching, [place], [facility] Genetic Counselor [email or web address] (P) [phone]   The patient was seen for a total of 40 minutes in face-to-face genetic counseling.  Drs. Magrinat, Lindi Adie and/or Burr Medico were available to discuss this case as needed.  _______________________________________________________________________ For Office Staff:  Number of people involved in session: 1 Was an Intern/ student involved with case: no

## 2020-09-10 ENCOUNTER — Ambulatory Visit: Payer: Self-pay | Admitting: Surgery

## 2020-09-10 NOTE — H&P (Signed)
History of Present Illness (Kenneth Blackwell L. Zenia Resides MD; 09/10/2020 10:05 AM) The patient is a 40 year old male who presents with an abdominal mass.CC: mass  HPI: [name] is a 40 yo male who was referred for a newly-diagnosed retroperitoneal mass. He was recently hospitalized in late April with abdominal pain and leukocytosis. He underwent a CT scan that showed mild appendiceal enhancement, as well as an incidental 2.9cm mass in the retroperitoneum adjacent to the duodenum and aorta. He was treated with antibiotics and a PPI for his abdominal pain and symptoms improved, and he was discharged from the hospital. He says he still has intermittent epigastric pain but it is not as bad as it was in the hospital. He denies heart palpitations and sweats. His brother had a para-aortic paraganglioma that was resected at [facility] His grandmother had pancreatic cancer and died at age 38. The patient was seen by a genetic counselor yesterday and a genetic screening panel was sent. The patient has no personal history of cancer or thyroid tumors.  PMH: herniated lumbar disc  PSH: right hip surgery  FHx: paraganglioma (brother), pancreatic cancer (maternal grandmother, died at age 26)  Social: Current smoker     Physical Exam ([place] L. Zenia Resides MD; 09/10/2020 10:06 AM) The physical exam findings are as follows: Note: Constitutional: No acute distress; conversant; no deformities Neuro: alert and oriented; cranial nerves grossly in tact; no focal deficits Eyes: Moist conjunctiva; anicteric sclerae; extraocular movements in tact Neck: Trachea midline Lungs: Normal respiratory effort; lungs clear to auscultation bilaterally; symmetric chest wall expansion CV: Regular rate and rhythm; no murmurs; no pitting edema GI: Abdomen soft, nontender, nondistended; no masses or organomegaly; no surgical scars MSK: Normal gait and station; no clubbing/cyanosis Psychiatric: Appropriate affect; alert and oriented 3    Assessment  & Plan (Racquelle Hyser L. 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 I have personally reviewed his CT scan. There is an approximately 3cm enhancing mass in the retroperitoneum just to the left of the aorta, near the distal duodenum. It is well-circumscribed and does not appear to invade adjacent structures. There is no evidence of metastatic disease. 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 The patient has no symptoms of tachycardia or hypertension. I recommend excision of this mass, which would be done via a laparotomy given the proximity to the aorta. Will first check plasma metanephrines to rule out a pheo, and will also discuss this case at multidisciplinary tumor board next week. I will call the patient next week after conference but anticipate proceeding with surgical resection. I discussed the details of the operation with the patient and his wife, and we reviewed anticipated hospitalization of 3-5 days with about a 4-6 week recovery time at home. Results of genetic screening are pending and should be available in about 3 weeks. I anticipate scheduling surgery in early June pending results of lab workup.  Michaelle Birks, MD [facility] Surgery General, Hepatobiliary and Pancreatic Surgery 09/10/20 10:19 AM

## 2020-09-18 ENCOUNTER — Other Ambulatory Visit: Payer: Self-pay

## 2020-09-18 NOTE — Progress Notes (Signed)
The proposed treatment discussed in conference is for discussion purposes only and is not a binding recommendation.  The patients have not been physically examined, or presented with their treatment options.  Therefore, final treatment plans cannot be decided.   

## 2020-09-24 ENCOUNTER — Ambulatory Visit (INDEPENDENT_AMBULATORY_CARE_PROVIDER_SITE_OTHER): Payer: 59 | Admitting: Internal Medicine

## 2020-09-24 ENCOUNTER — Encounter: Payer: Self-pay | Admitting: Internal Medicine

## 2020-09-24 ENCOUNTER — Other Ambulatory Visit: Payer: Self-pay

## 2020-09-24 VITALS — BP 130/88 | HR 66 | Ht 72.0 in | Wt 159.0 lb

## 2020-09-24 DIAGNOSIS — R1900 Intra-abdominal and pelvic swelling, mass and lump, unspecified site: Secondary | ICD-10-CM | POA: Insufficient documentation

## 2020-09-24 DIAGNOSIS — Z8489 Family history of other specified conditions: Secondary | ICD-10-CM

## 2020-09-24 NOTE — Progress Notes (Signed)
Patient ID: Kenneth Blackwell, male   DOB: 1981/02/09, 40 y.o.   MRN: [id number]   This visit occurred during the SARS-CoV-2 public health emergency.  Safety protocols were in place, including screening questions prior to the visit, additional usage of staff PPE, and extensive cleaning of exam room while observing appropriate contact time as indicated for disinfecting solutions.   Patient ID: Kenneth Blackwell, male   DOB: 1980/07/11, 40 y.o.   MRN: [id number]   HPI  Kenneth Blackwell is a 40 y.o.-year-old male, referred by Dr. Michaelle Birks, for evaluation for a newly found retroperitoneal mass and family history of pheochromocytoma and paraganglioma.  Patient is here with his mother who offers part of the history.  Patient presented to the ED on 08/25/2020 for abdominal pain.  At that time, he was found to have mild appendicitis and treated with antibiotics.  However, during work-up for this, he had a CT scan showing: 2.9 cm enhancing left retroperitoneal mass, in proximity to the distal duodenum. Primary considerations include neurogenic tumor, extra-adrenal pheochromocytoma, less likely gastrointestinal stromal tumor or neuroendocrine tumor:   Upon questioning, patient has a family history of: - pericarotid paraganglioma (unresectable) and pheochromocytoma (hormonally active) in a brother (7 years old) - he was in a study at [place] and had genetic testing >> father is carrier - neck paraganglioma (silent) in another brother 39 years old) - HTN in father (87 years old) - Pancreatic cancer in maternal grandmother (diagnosed in her early 33s)  Patient was seen by surgery please contemplating resection.  However, due to his family history, he was referred for genetic counseling.  He had this appointment on 09/09/2020. Blood was sent for a mutation panel containing 84 genes including: MEN1, RET, VHL, SDHx, MAX, TMEM127, PRKAR1A, Tp53, MLH and [place].  Results will be ready in approximately 3 weeks.  Patient's  medical history includes: -Lumbar back pain with radiculopathy -Right hip fracture in 2007 s/p ORIF -Sinusitis -Bilateral varicocele and hydrocele  He does not have a clear history of hypertension (previous blood pressures were checked in the setting of pain): -Hypertension or hypertensive spells -Palpitations -Increased sweating -Headaches -Increased anxiety  Reviewed previous blood pressure levels: BP Readings from Last 3 Encounters:  08/26/20 129/86  09/11/19 (!) 113/58  07/27/17 131/87   He is not on antihypertensive medications.    Chemistry      Component Value Date/Time   NA 141 08/26/2020 0451   K 4.6 08/26/2020 0451   CL 109 08/26/2020 0451   CO2 26 08/26/2020 0451   BUN 9 08/26/2020 0451   CREATININE 1.31 (H) 08/26/2020 0451      Component Value Date/Time   CALCIUM 9.4 08/26/2020 0451   ALKPHOS 62 08/25/2020 1145   AST 28 08/25/2020 1145   ALT 47 (H) 08/25/2020 1145   BILITOT 0.7 08/25/2020 1145     No history of hypokalemia: Lab Results  Component Value Date   K 4.6 08/26/2020   K 4.2 08/25/2020   No known history of hypo or hyperthyroidism.  TSH levels were normal: No results found for: TSH   No h/o diabetes: 2 12/21/2008: HbA1c 5.8% No results found for: HGBA1C   No known hyperparathyroidism or hypercalcemia: No results found for: PTH  Lab Results  Component Value Date   CALCIUM 9.4 08/26/2020   CALCIUM 9.6 08/25/2020   Pt. denies use of stimulants.   Denies street drug use.   Not on steroids now.  He had a spinal block more than a month  ago. He stopped smoking 3 weeks ago.  He gained some weight afterwards.  Now  On Gabapentin for back pain >> stopped today. May need surgery for herniated disk.  ROS: Constitutional: + weight gain, no fatigue, no subjective hyperthermia/hypothermia, + nocturia lately Eyes: no blurry vision, no xerophthalmia ENT: no sore throat, no nodules palpated in throat, no dysphagia/odynophagia, no  hoarseness Cardiovascular: no CP/SOB/palpitations/leg swelling Respiratory: no cough/SOB Gastrointestinal: no N/V/D/C Musculoskeletal: + muscle/joint aches Skin: no rashes and normal Neurological: no tremors/numbness/tingling/dizziness Psychiatric: + depression/no anxiety  Past Medical History:  Diagnosis Date   Family history of pancreatic cancer 09/09/2020   Family history of pheochromocytoma 09/09/2020   Past Surgical History:  Procedure Laterality Date   HIP SURGERY     Social History   Socioeconomic History   Marital status: Married    Spouse name: Not on file   Number of children: Not on file   Years of education: Not on file   Highest education level: Not on file  Occupational History   Not on file  Tobacco Use   Smoking status: Current Every Day Smoker    Types: Cigarettes   Smokeless tobacco: Never Used  Vaping Use   Vaping Use: Never used  Substance and Sexual Activity   Alcohol use: Yes   Drug use: No   Sexual activity: Yes    Comment: has taken hydrocodone a few days ago  Other Topics Concern   Not on file  Social History Narrative   Not on file   Social Determinants of Health   Financial Resource Strain: Not on file  Food Insecurity: Not on file  Transportation Needs: Not on file  Physical Activity: Not on file  Stress: Not on file  Social Connections: Not on file  Intimate Partner Violence: Not on file   Current Outpatient Medications on File Prior to Visit  Medication Sig Dispense Refill   acetaminophen (TYLENOL) 500 MG tablet Take 2 tablets (1,000 mg total) by mouth every 8 (eight) hours as needed for fever. 30 tablet 0   amoxicillin-clavulanate (AUGMENTIN) 875-125 MG tablet Take 1 tablet by mouth every 12 (twelve) hours. 20 tablet 0   gabapentin (NEURONTIN) 300 MG capsule Take 900 mg by mouth 3 (three) times daily.     hydrOXYzine (ATARAX/VISTARIL) 25 MG tablet Take 1 tablet (25 mg total) by mouth every 6 (six) hours. 12 tablet 0   omeprazole  (PRILOSEC) 40 MG capsule Take 1 capsule (40 mg total) by mouth daily. 30 capsule 1   ondansetron (ZOFRAN-ODT) 4 MG disintegrating tablet Take 1 tablet (4 mg total) by mouth every 6 (six) hours as needed for nausea. 20 tablet 0   oxyCODONE (OXY IR/ROXICODONE) 5 MG immediate release tablet Take 1 tablet (5 mg total) by mouth every 6 (six) hours as needed. 10 tablet 0   No current facility-administered medications on file prior to visit.   No Known Allergies Family History  Problem Relation Age of Onset   Pancreatic cancer Maternal Grandmother        dx early 72s   Other Half-Brother        paternal; paraganglioma and pheochromocytoma   Other Half-Brother        paternal; ? paraganglioma   PE: BP 130/88 (BP Location: Right Arm, Patient Position: Sitting, Cuff Size: Normal)   Pulse 66   Ht 6' (1.829 m)   Wt 159 lb (72.1 kg)   SpO2 99%   BMI 21.56 kg/m  Wt Readings from Last 3  Encounters:  09/24/20 159 lb (72.1 kg)  08/25/20 150 lb (68 kg)  07/27/17 150 lb (68 kg)   Constitutional: Normal weight, in NAD Eyes: PERRLA, EOMI ENT: moist mucous membranes, no thyromegaly,  no cervical lymphadenopathy Cardiovascular: RRR, No MRG Respiratory: CTA B Gastrointestinal: abdomen soft, NT, ND, BS+ Musculoskeletal: no deformities, strength intact in all 4 Skin: moist, warm, no rashes Neurological: no tremor with outstretched hands, DTR normal in all 4  ASSESSMENT: 1.  Retroperitoneal mass  2.  Family history of pheochromocytoma and paraganglioma  PLAN: 1. And 2.  -Patient with a recently found left retroperitoneal mass 2.9 cm.  This was found during investigation for acute appendicitis last month. -Upon questioning, patient has a family history of paraganglioma in 2 of his brothers and pheochromocytoma in one of his brothers.  He also has HTN in father. Pt has stage 0-1 HTN. -He had genetic counseling and a panel of gene mutations are pending (for Li-Fraumeni syndrome, Lynch syndrome,  Carney complex, MEN 1, MEN 2, VHL, SDH mutations, etc.).   If they are positive, there are guidelines for follow-up of this patient is depending on the mutation, to reduce the risk of complications related to associated conditions. -At this visit, we discussed about the possibility that the retroperitoneal mass can produce catecholamines/metanephrines.  I explained the effects of having high amounts of these hormones in his system including HTN, headache, anxiety, sweating, palpitations.  He does not have the symptoms.  He had slightly higher blood pressure recently, however, this was checked in the setting of pain. -At this visit, will check plasma catecholamines and metanephrines (with patient lying down for 15 minutes in a dark room).  We discussed that we occasionally see false elevation of normetanephrine's and in that case, we will need to repeat the test and occasionally checking 24-hour urine levels, which are more specific.  If plasma catecholamines and metanephrines are not elevated, then his abdominal mass is not producing these hormones.  -After this investigation, if the results are positive (actually, even if they are negative, due to the possibility of silent pheochromocytoma/paraganglioma), I would suggest whole-body PET scan versus MIBG scan, depending on the harbored gene mutation: Pseudo hypoxic gene mutations (SDHx, VHL) are more active on PET scan, while kinase signaling mutations (rRET, NF1, TMEM127, MAX) are more active on MIBG SPECT/PET scan -After this investigation, and after appropriate screening for other associated conditions, (for example, depending on the mutation, he may need a pituitary MRI), the retroperitoneal mass will most likely need to be resected.  If this is an active mass, will need to start on alpha blockade, most likely with doxazosin and we may possibly need to add beta-blockers if his heart rate increases (our target systolic blood pressure will be 110 and target  heart rate 60-80).  Ideally he would not become orthostatic.  He may need to be given several liters of fluids before the surgery -he may need admission the day before surgery. - We will keep in touch and discuss about the results as soon as they return  - I will see him back in 3 months   Office Visit on 09/24/2020  Component Date Value Ref Range Status   Metanephrine, Free 09/24/2020 40  <=57 pg/mL Final   Comment: . This test was developed and its analytical performance characteristics have been determined by [place], [place]. It has not been cleared or approved by the U.S. Food and Drug Administration. This assay has been validated  pursuant to the CLIA regulations and is used for clinical purposes. Kenneth Blackwell, Free 09/24/2020 634 (A) <=148 pg/mL Final   Comment: . This test was developed and its analytical performance characteristics have been determined by [place], [place]. It has not been cleared or approved by the U.S. Food and Drug Administration. This assay has been validated pursuant to the CLIA regulations and is used for clinical purposes. .    Total Metanephrines-Plasma 09/24/2020 674 (A) <=205 pg/mL Final   Comment: . For additional information, please refer to http://education.questdiagnostics.com/faq/MetFractFree (This link is being provided for informational/educatio informational/educational purposes only.) . Elevations >4-fold upper reference range: strongly suggestive of a pheochromocytoma(1). . Elevations >1- 4-fold upper reference range: significant but not diagnostic, may be due to medications or stress. Suggest running 24 hr urine fractionated metanephrines and/or serum Chromagranin A for confirmation. . Reference: . (1)Algeciras-Schimnich A et al, Plasma Chromogranin A or Urine Fractionated Metanephrines Follow-Up Testing Improves the Diagnostic Accuracy of Plasma  Fractionated Metanephrines for Pheochromocytoma. The Journal of Clinical Endocrinology # Metabolism 93(1), 91-95, 2008. . . . This test was developed and its analytical performance characteristics have been determined by [place], [place]. It has not been cleared or a                          pproved by the U.S. Food and Drug Administration. This assay has been validated pursuant to the CLIA regulations and is used for clinical purposes. Kenneth Blackwell Kitchen    Epinephrine 09/24/2020 46  pg/mL Final   Comment: . This test was developed and its analytical performance characteristics have been determined by [facility]. It has not been cleared or approved by FDA. This assay has been validated pursuant to the CLIA regulations and is used for clinical purposes.    Norepinephrine 09/24/2020 1,810 (A) pg/mL Final   Comment: . This test was developed and its analytical performance characteristics have been determined by [facility]. It has not been cleared or approved by FDA. This assay has been validated pursuant to the CLIA regulations and is used for clinical purposes.    Dopamine 09/24/2020 69 (A) pg/mL Final   Comment: . This test was developed and its analytical performance characteristics have been determined by [facility]. It has not been cleared or approved by FDA. This assay has been validated pursuant to the CLIA regulations and is used for clinical purposes.    Total Catecholamines 09/24/2020 1,925 (A) pg/mL Final   Comment: . Adult Reference Ranges for Catecholamines, Plasma . Epinephrine           Supine:  <50 pg/mL                       Upright: <95 pg/mL . Norepinephrine        Supine:  112-658 pg/mL                       Upright: [phone] pg/mL . Dopamine              Supine:  <10 pg/mL                        Upright: <20 pg/mL . Total (N+E+D)         Supine:  123-671 pg/mL  Upright: [id number] pg/mL . Pediatric Reference Ranges for Catecholamines, Plasma . Due to stress, plasma catecholamine levels are generally unreliable in infants and small children. Urinary catecholamine assays are more reliable. Kenneth Blackwell Kitchen Epinephrine    3-15 Years         Supine:  < or = 464 pg/mL                       Upright: No Reference                                Range Available . Norepinephrine    3-15 Years         Supine:  < or = 1251 pg/mL                       Upright: No Reference                                Range Available . Dopamine    3-15 Years         Supine:  <60 p                          g/mL                       Upright: No Reference                                Range Available . Pediatric data from [facility] [phone]. . This test was developed and its analytical performance characteristics have been determined by [facility]. It has not been cleared or approved by FDA. This assay has been validated pursuant to the CLIA regulations and is used for clinical purposes.    Abnormal plasma norepinephrine, dopamine, and normetanephrine.  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  Results of the genetic testing are also back and these confirm and SDH D mutation.  In light of the above changes, we will go ahead and order a PET scan to look for culprit paraganglioma masses.   Philemon Kingdom, MD PhD [facility] Endocrinology

## 2020-09-24 NOTE — Patient Instructions (Signed)
Please stop at the lab.  Please return to see me in 3 months.

## 2020-10-01 ENCOUNTER — Encounter: Payer: Self-pay | Admitting: Genetic Counselor

## 2020-10-01 ENCOUNTER — Telehealth: Payer: Self-pay | Admitting: Genetic Counselor

## 2020-10-01 DIAGNOSIS — Z1589 Genetic susceptibility to other disease: Secondary | ICD-10-CM

## 2020-10-01 DIAGNOSIS — Z1379 Encounter for other screening for genetic and chromosomal anomalies: Secondary | ICD-10-CM | POA: Insufficient documentation

## 2020-10-01 HISTORY — DX: Genetic susceptibility to other disease: Z15.89

## 2020-10-01 NOTE — Telephone Encounter (Signed)
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  Briefly discussed management implications and implications for family members.  Set up follow-up appointment for 6/3 at 1pm to discuss genetic testing results in greater detail.

## 2020-10-03 ENCOUNTER — Other Ambulatory Visit: Payer: Self-pay | Admitting: Internal Medicine

## 2020-10-03 DIAGNOSIS — D447 Neoplasm of uncertain behavior of aortic body and other paraganglia: Secondary | ICD-10-CM

## 2020-10-04 ENCOUNTER — Inpatient Hospital Stay: Payer: 59 | Attending: Genetic Counselor | Admitting: Genetic Counselor

## 2020-10-04 ENCOUNTER — Encounter: Payer: Self-pay | Admitting: Genetic Counselor

## 2020-10-04 ENCOUNTER — Other Ambulatory Visit: Payer: Self-pay

## 2020-10-04 DIAGNOSIS — Z1589 Genetic susceptibility to other disease: Secondary | ICD-10-CM | POA: Diagnosis not present

## 2020-10-04 DIAGNOSIS — Z1379 Encounter for other screening for genetic and chromosomal anomalies: Secondary | ICD-10-CM

## 2020-10-07 ENCOUNTER — Encounter: Payer: Self-pay | Admitting: Genetic Counselor

## 2020-10-07 NOTE — Progress Notes (Signed)
GENETIC TEST RESULTS  Patient Name: Kenneth Blackwell Patient Age: 40 y.o. Encounter Date: 10/04/2020  Referring Provider: Michaelle Birks, MD  Kenneth Blackwell was seen in the [place] clinic on Sep 09, 2020 due to a family history of paraganglioma and pheochromocytomas and concern regarding a hereditary predisposition to tumors/cancer in the family. Please refer to the prior Genetics clinic note for more information regarding Kenneth Blackwell medical and family histories and our assessment at the time.  HISTORY:  In late April 2022, Kenneth Blackwell presented to the emergency room due to abdominal pain.  He underwent CT scan of the abdomen. He was seen to have a 2.9 cm enhancing left retroperitoneal mass near the distal duodenum of uncertain etiology.  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   FAMILY HISTORY:  We obtained a detailed, 4-generation family history.  Significant diagnoses are listed below: Family History  Problem Relation Age of Onset  . Pancreatic cancer Maternal Grandmother        dx early 55s  . Other Half-Brother        paternal; paraganglioma and pheochromocytoma; SDHD mutation   . Other Half-Brother        paternal; ? paraganglioma  . Hypertension Father   . Prostate cancer Paternal Grandfather        d. 24s    Kenneth Blackwell has four children--ages 3 to 54.  He has two maternal half siblings.  His mother is living at age 39.  His maternal grandmother had a history of pancreatic cancer diagnosed at age 34.   Kenneth Blackwell has three paternal half siblings.  He reports that his half brother, Barbaraann Rondo, age 26, has a history of paragangliomas and pheochromocytomas.  He reportedly is also positive for the family SDHD mutation.  His other siblings have not had genetic testing.  His father, age 18, has a history of hypertension but no history of tumors.  His paternal grandfather had prostate cancer and died in his 16s.   There is no reported  Ashkenazi Jewish ancestry. There is no known consanguinity.  GENETIC TESTING:  At the time of Kenneth Blackwell visit, we recommended he pursue genetic testing of the Multi-Cancer +RNA Panel through [facility] test. 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 There were no deleterious mutations detected in the [facility] Multi-Cancer +RNA Panel.  The Multi-Cancer + RNA Panel offered by [facility] includes sequencing and/or deletion/duplication analysis of the following 84 genes:  AIP*, ALK, APC*, ATM*, AXIN2*, BAP1*, BARD1*, BLM*, BMPR1A*, BRCA1*, BRCA2*, BRIP1*, CASR, CDC73*, CDH1*, CDK4, CDKN1B*, CDKN1C*, CDKN2A, CEBPA, CHEK2*, CTNNA1*, DICER1*, DIS3L2*, EGFR, EPCAM, FH*, FLCN*, GATA2*, GPC3, GREM1, HOXB13, HRAS, KIT, MAX*, MEN1*, MET, MITF, MLH1*, MSH2*, MSH3*, MSH6*, MUTYH*, NBN*, NF1*, NF2*, NTHL1*, PALB2*, PDGFRA, PHOX2B, PMS2*, POLD1*, POLE*, POT1*, PRKAR1A*, PTCH1*, PTEN*, RAD50*, RAD51C*, RAD51D*, RB1*, RECQL4, RET, RUNX1*, SDHA*, SDHAF2*, SDHB*, SDHC*, SDHD*, SMAD4*, SMARCA4*, SMARCB1*, SMARCE1*, STK11*, SUFU*, TERC, TERT, TMEM127*, Tp53*, TSC1*, TSC2*, VHL*, WRN*, and WT1.  RNA analysis is performed for * genes.       DISCUSSION:  Clinical condition Single pathogenic variants in the SDHD gene are associated with the development of paragangliomas (PGL) and pheochromocytomas [facility]), as seen in hereditary paraganglioma-pheochromocytoma syndrome (PMID: 63785885, 02774128, 78676720, 94709628), gastrointestinal stromal tumors (GIST) (PMID: 36629476), and Carney-Stratakis syndrome (PMID: 54650354, 65681275, 17001749, 44967591, 63846659, 93570177). It has been suggested that variants in SDHD are associated with an increased risk of  other cancers such as pituitary adenoma (PMID: 45809983, 38250539), but the data are currently limited and emerging. The clinical presentation is highly variable among those  with a pathogenic variant in SDHD and may be difficult to predict. An individual with a SDHD pathogenic variant will not necessarily develop cancer in their lifetime, but the risk for cancer is increased over that of the general population.  PGL/PCC Paragangliomas (PGL) are rare, adult-onset, typically benign neuroendocrine tumors that arise from paraganglia. Paraganglia are a collection of neuroendocrine tissues that are distributed throughout the body, from the middle ear and skull base to the pelvis. PGLs that develop in the head and neck are called head-and-neck paragangliomas (HNP). PGLs located outside the head and neck most commonly occur in the adrenal glands and are called pheochromocytomas [facility]), also known as chromaffin tumors. PCCs are catecholamine-secreting PGLs that are confined to the adrenal medulla, as defined by the The World Health Organization Tumor Classification; however, this term may also be used to refer to catecholamine-producing PGLs regardless of whether they are adrenal or extra-adrenal (PMID: 76734193). These lesions can cause excessive production of adrenal hormones, resulting in hypertension, headaches, tachycardia, anxiety, and sweaty or clammy skin in some individuals (PMID: 79024097, 35329924, 26834196). Most cases of PGL and Klein are sporadic, but approximately one-third are familial and due to an identifiable pathogenic variant in a susceptibility gene, such as SDHD, that can result in hereditary paraganglioma-pheochromocytoma syndrome (PMID: 22297989, 21194174, 08144818, 56314970).  Individuals with a pathogenic variant in SDHD are primarily at risk to develop multiple benign parasympathetic HNPs, but multiple extra-adrenal sympathetic and adrenal tumors are also frequent (PMID: 26378588, 50277412). Approximately 68-79% of affected individuals develop HNPs (PMID: 87867672, 09470962, 83662947, 65465035), 39% develop thoracic/abdominal PGL, and 29-53% develop [facility] (PMID:  46568127, 51700174, 94496759, 16384665). Compared to sporadic tumors, those with hereditary pathogenic variants in SDHD tend to present at younger ages and are more likely to have multiple synchronous neoplasms and multifocal, bilateral, recurrent disease (PMID: 99357017, 79390300). The mean age of tumor presentation is approximately 23-30 years (PMID: 92330076, 22633354). Malignant transformation of PGLs has rarely been associated with the SDHD gene (PMID: 56256389, 37342876).  GIST Gastrointestinal stromal tumors (GIST) are rare, typically adult-onset sarcomas that may be either benign or malignant. They can occur sporadically or due to a heritable pathogenic variant in a susceptibility gene, such as SDHD (PMID: 81157262). GIST can develop anywhere along the GI tract, which includes the esophagus, stomach, gallbladder, liver, small intestine, colon, rectum, anus, and lining of the gut. GISTs arise from a specific cell type, interstitial cells of [place] (ICC), which line the walls of the GI tract. More than half of GISTs start in the stomach. The next-largest proportion of cases start in the small intestine, followed by the omentum and peritoneum (PMID: 03559741, 63845364, 68032122; [employer] of [employer]. Genetics Home Reference. Gastrointestinal stromal tumor. Accessed June 2015).  Carney-Stratakis syndrome Carney-Stratakis syndrome (CSS) is an autosomal dominant condition that can be caused by pathogenic variants in SDHD. This rare condition is characterized by the development of PGL, GIST, or both (PMID: 48250037, 04888916, 94503888, 28003491, 79150569, 79480165). CSS is very similar to but distinctly different from Emmons triad, a typically sporadic condition associated with pulmonary chondromas, GIST, and PGL (PMID: 53748270, 78675449).  Gene information SDHD is a tumor-suppressor gene that is involved in complex II of the mitochondrial electron transport chain (UniProtKB - [id number] (DHSD_HUMAN);  [employer]. Accessed September 2015). The succinate dehydrogenase enzyme, which is part of complex II, is composed  of four subunit proteins encoded by SDHA, SDHB, SDHC, and SDHD. These are nuclear genes whose transcripts are then imported into the mitochondria, where they are modified, folded, and assembled (PMID: 22025427). Lack of any component of mitochondrial complex ? will result in the instability of the entire complex (PMID: 06237628). If there is a pathogenic variant in this gene that prevents it from functioning normally, the risk of developing certain types of cancers may be increased.  Inheritance While the SDHD gene follows traditional Mendelian autosomal dominant inheritance, there seems to be demonstration of parent-of-origin effects. SDHD generally causes disease when the pathogenic variant is inherited from the father (PMID: 31517616, 07371062) whereas the risk of clinical disease after maternal transmission appears to be low (PMID: 69485462, 70350093, 81829937). The underlying cause of this parent-of-origin effect is poorly understood (PMID: 16967893, 81017510). The offspring of an individual with a pathogenic SDHD variant, regardless of parental origin, has a 50% risk of passing the variant on to offspring. It is now possible to identify at-risk relatives who can pursue testing for this specific familial variant.  MANAGEMENT:  It is suggested that individuals with hereditary paraganglioma-pheochromocytoma syndrome, along with their at-risk relatives, have regular clinical monitoring by a physician or medical team with expertise in the treatment of hereditary GIST and PGL/PCC syndromes. A consultation with an endocrine surgeon, endocrinologist, and otolaryngologist is also recommended to establish an individualized care plan (PMID: 25852778).  Screening has been suggested to begin between five and ten years of age, or at least ten years before the earliest age at  diagnosis in the family (PMID: 24235361, 44315400, 86761950). Lifelong annual biochemical and clinical surveillance is suggested (NCCN, PMID: 93267124, 58099833, 82505397, 67341937, 90240973, 53299242, 68341962)  . Physical exam and blood pressure at the time of diagnosis and then every 6-12 months . 24-hour urinary excretion of fractionated metanephrines and catecholamines and/or plasma fractionated metanephrines at least annually (and prior to any surgical procedures) to detect metastatic disease, tumor recurrence, or development of additional tumors o Follow up with imaging by CT, MRI, 123I-MIBG (metaiodobenzylguanidine) scintigraphy, or FDG-PET if the fractionated metanephrine and/or catecholamine levels become elevated, or if the original tumor had minimal or no catecholamine/fractionated metanephrine excess . MRI or CT of skull base and neck every 6-36 months . Periodic (e.g., every 1-4 years) abdominal, thorax, and pelvic MRI or CT to detect PGL o In order to minimize radiation exposure, MRI may be the preferable imaging modality with CT and nuclear imaging reserved to further characterize detected tumors . Periodic (e.g., every 1-4 years) 123I-MIBG scintigraphy or PET to detect PGLs or metastatic disease that may be undetectable by MRI or CT o Imaging modalities should be at the discretion of the managing provider due to conflicting data regarding the utility and efficacy of the various options (PMID: 22979892, 11941740) . Evaluation of cases with extra-adrenal sympathetic PGL and [place] for blood pressure elevations, tachycardia, and other signs and symptoms of catecholamine hypersecretion . Consideration of evaluation for GISTs in individuals (especially children, adolescents, or young adults) who have unexplained gastrointestinal symptoms (e.g., abdominal pain, upper gastrointestinal bleeding, nausea, vomiting, difficulty swallowing) or who experience unexplained intestinal obstruction or  anemia . Complete blood count with red blood cell measurements annually to monitor for signs of GIST . Abdominal MRI every 4-6 years, starting at age 37 (or 10 years before the earliest diagnosis of kidney in family) to monitor for kidney cancer  . Medical genetics consultation  Summary of medical management and surveillance recommendations (PMID: 81448185,  25053976):  Recommendation Screening   Age to begin screening 5-10 years  Physical exam and blood pressure Every 6-12 months  Urinary excretion of fractionated metanephrines and catecholamines in 24 hours Annually and prior to surgery  MRI/CT of skull base and neck Every 6-36 months  MRI/CT of abdominal, thorax, and pelvis (can consider whole-body MRI if available) Every 1-4 years  Periodic MRI or CT and 123I-MIBG (metaiodobenzylguanidine) scintigraphy Every 1-4 years  It remains unclear whether imaging studies should be conducted as frequently in childhood as in adulthood (PMID: 73419379).  It is is important to note that individuals with a pathogenic SDHD variant may be at a greater risk of developing PGL or [place] when living in higher altitudes or when chronically exposed to hypoxic conditions. In general, inactivating mutations in one of the succinate dehydrogenase genes, which includes SDHD, leads to accumulation of succinate, the formation of reactive oxygen species, and the activation of hypoxia-dependent pathways (PMID: 02409735). Avoidance of living at high altitudes and activities that promote long-term exposure to hypoxia and predispose to chronic lung disease (e.g., smoking) is therefore encouraged (PMID: 32992426, 83419622).  An individual's cancer risk and medical management are not determined by genetic test results alone. Overall cancer risk assessment incorporates additional factors, including personal medical history, family history, and any available genetic information that may result in a personalized plan for cancer prevention  and surveillance.  FAMILY IMPLICATIONS:   Knowing if a pathogenic variant in SDHD is present is advantageous.  Kenneth Blackwell children and siblings have a 50% chance to have inherited this mutation. Kenneth Blackwell father and paternal family members could also benefit from genetic testing. We recommend they have genetic testing for this same mutation, as identifying the presence of this mutation would allow them to also take advantage of risk-reducing measures.   Additionally, individuals with a pathogenic variant in SDHD are carriers mitochondrial complex II defiency. Mitochondrial complex II deficiency is a condition characterized by brain and muscle diseases (encephalopathy, myopathy, hypotonia, leukodystrophy), poor growth, short stature, joint contractures and cardiomyopathy. For there to be a risk of mitochondrial complex II deficiency, both parents would each have to have a single pathogenic variant in SDHD; in such a case, the risk of having an affected child is 25%.   PLAN: Kenneth Blackwell will need to be followed as high risk based on his diagnosis of a SDHD mutation.  Kenneth Blackwell has identified a endocrinologist that he plans to see for follow up for his diagnosis with an SDHD mutation.  This physician is Dr. Philemon Kingdom.   Kenneth Blackwell stated that he plans to share the information regarding the SDHD mutation with his family members.  Contact information to genetic counselor, Linna Darner, was provided [phone]), who is able to conduct genetic counseling/testing for those under the age of 72.    SUPPORT AND RESOURCES: If Kenneth Blackwell is interested in SDHx-specific information and support, some people have found [place] (www.pheopara.org) useful. They provide opportunities to speak with other individuals from high-risk families. To locate genetic counselors in other cities, visit the website of the [employer] (ArtistMovie.se) and Secretary/administrator for a Social worker by zip  code.  We encouraged Kenneth Blackwell to remain in contact with [place] on an annual basis so we can update his personal and family histories, and let him know of advances in cancer genetics that may benefit the family. Our contact number was provided. Kenneth Blackwell questions were answered to his satisfaction today, and he knows  he is welcome to call anytime with additional questions.    The patient was seen for a total of 30 minutes in face-to-face genetic counseling.  Patient attended session alone.   Number of people involved in session: 1

## 2020-10-08 LAB — METANEPHRINES, PLASMA
Metanephrine, Free: 40 pg/mL (ref <=57)
Normetanephrine, Free: 634 pg/mL — ABNORMAL HIGH (ref <=148)
Total Metanephrines-Plasma: 674 pg/mL — ABNORMAL HIGH (ref <=205)

## 2020-10-08 LAB — CATECHOLAMINES, FRACTIONATED, PLASMA
Dopamine: 69 pg/mL — ABNORMAL HIGH
Epinephrine: 46 pg/mL
Norepinephrine: 1810 pg/mL — ABNORMAL HIGH
Total Catecholamines: 1925 pg/mL — ABNORMAL HIGH

## 2020-10-16 ENCOUNTER — Ambulatory Visit (HOSPITAL_COMMUNITY)
Admission: RE | Admit: 2020-10-16 | Discharge: 2020-10-16 | Disposition: A | Discharge status: Home / Self Care | Payer: 59 | Source: Ambulatory Visit | Attending: Internal Medicine | Admitting: Internal Medicine

## 2020-10-16 ENCOUNTER — Other Ambulatory Visit: Payer: Self-pay

## 2020-10-16 DIAGNOSIS — D447 Neoplasm of uncertain behavior of aortic body and other paraganglia: Secondary | ICD-10-CM | POA: Diagnosis present

## 2020-10-16 LAB — GLUCOSE, CAPILLARY: Glucose-Capillary: 104 mg/dL — ABNORMAL HIGH (ref 70–99)

## 2020-10-16 MED ORDER — FLUDEOXYGLUCOSE F - 18 (FDG) INJECTION
8.2000 | Freq: Once | INTRAVENOUS | Status: AC
  Administered 2020-10-16: 7.9 via INTRAVENOUS

## 2020-10-21 ENCOUNTER — Other Ambulatory Visit: Payer: Self-pay | Admitting: Internal Medicine

## 2020-10-21 DIAGNOSIS — D447 Neoplasm of uncertain behavior of aortic body and other paraganglia: Secondary | ICD-10-CM

## 2020-10-21 DIAGNOSIS — D446 Neoplasm of uncertain behavior of carotid body: Secondary | ICD-10-CM

## 2020-10-28 ENCOUNTER — Other Ambulatory Visit: Payer: Self-pay

## 2020-10-28 ENCOUNTER — Ambulatory Visit
Admission: RE | Admit: 2020-10-28 | Discharge: 2020-10-28 | Disposition: A | Discharge status: Home / Self Care | Payer: 59 | Source: Ambulatory Visit | Attending: Internal Medicine | Admitting: Internal Medicine

## 2020-10-28 DIAGNOSIS — D447 Neoplasm of uncertain behavior of aortic body and other paraganglia: Secondary | ICD-10-CM

## 2020-10-28 DIAGNOSIS — D446 Neoplasm of uncertain behavior of carotid body: Secondary | ICD-10-CM

## 2020-10-28 MED ORDER — IOPAMIDOL (ISOVUE-300) INJECTION 61%
75.0000 mL | Freq: Once | INTRAVENOUS | Status: AC | PRN
  Administered 2020-10-28: 75 mL via INTRAVENOUS

## 2020-11-26 NOTE — Progress Notes (Signed)
Surgical Instructions    Your procedure is scheduled on 12/05/20.  Report to [facility] Main Entrance "A" at 05:30 A.M., then check in with the Admitting office.  Call this number if you have problems the morning of surgery:  [phone]   If you have any questions prior to your surgery date call [phone]: Open Monday-Friday 8am-4pm    Remember:  Do not eat or drink after midnight the night before your surgery      Take these medicines the morning of surgery with A SIP OF WATER  doxazosin (CARDURA)    As of today, STOP taking any Aspirin (unless otherwise instructed by your surgeon) Aleve, Naproxen, Ibuprofen, Motrin, Advil, Goody's, BC's, all herbal medications, fish oil, and all vitamins.          Do not wear jewelry or makeup Do not wear lotions, powders, colognes, or deodorant. Men may shave face and neck. Do not bring valuables to the hospital.  DO Not wear nail polish, gel polish, artificial nails, or any other type of covering on natural nails  including finger and toenails. If patients have artificial nails, gel coating, etc. that need to be removed by a nail salon please have this removed prior to surgery or surgery may need to be canceled/delayed if the surgeon/ anesthesia feels like the patient is unable to be adequately monitored.             [facility] is not responsible for any belongings or valuables.  Do NOT Smoke (Tobacco/Vaping) or drink Alcohol 24 hours prior to your procedure If you use a CPAP at night, you may bring all equipment for your overnight stay.   Contacts, glasses, dentures or bridgework may not be worn into surgery, please bring cases for these belongings   For patients admitted to the hospital, discharge time will be determined by your treatment team.   Patients discharged the day of surgery will not be allowed to drive home, and someone needs to stay with them for 24 hours.  ONLY 1 SUPPORT PERSON MAY BE PRESENT WHILE YOU ARE IN SURGERY.  IF YOU ARE TO BE ADMITTED ONCE YOU ARE IN YOUR ROOM YOU WILL BE ALLOWED TWO (2) VISITORS.  Minor children may have two parents present. Special consideration for safety and communication needs will be reviewed on a case by case basis.  Special instructions:    Oral Hygiene is also important to reduce your risk of infection.  Remember - BRUSH YOUR TEETH THE MORNING OF SURGERY WITH YOUR REGULAR TOOTHPASTE   [facility]- Preparing For Surgery  Before surgery, you can play an important role. Because skin is not sterile, your skin needs to be as free of germs as possible. You can reduce the number of germs on your skin by washing with CHG (chlorahexidine gluconate) Soap before surgery.  CHG is an antiseptic cleaner which kills germs and bonds with the skin to continue killing germs even after washing.     Please do not use if you have an allergy to CHG or antibacterial soaps. If your skin becomes reddened/irritated stop using the CHG.  Do not shave (including legs and underarms) for at least 48 hours prior to first CHG shower. It is OK to shave your face.  Please follow these instructions carefully.     Shower the NIGHT BEFORE SURGERY and the MORNING OF SURGERY with CHG Soap.   If you chose to wash your hair, wash your hair first as usual with your normal shampoo.  After you shampoo, rinse your hair and body thoroughly to remove the shampoo.  Then [employer] and genitals (private parts) with your normal soap and rinse thoroughly to remove soap.  After that Use CHG Soap as you would any other liquid soap. You can apply CHG directly to the skin and wash gently with a scrungie or a clean washcloth.   Apply the CHG Soap to your body ONLY FROM THE NECK DOWN.  Do not use on open wounds or open sores. Avoid contact with your eyes, ears, mouth and genitals (private parts). Wash Face and genitals (private parts)  with your normal soap.   Wash thoroughly, paying special attention to the area where your  surgery will be performed.  Thoroughly rinse your body with warm water from the neck down.  DO NOT shower/wash with your normal soap after using and rinsing off the CHG Soap.  Pat yourself dry with a CLEAN TOWEL.  Wear CLEAN PAJAMAS to bed the night before surgery  Place CLEAN SHEETS on your bed the night before your surgery  DO NOT SLEEP WITH PETS.   Day of Surgery: Take a shower with CHG soap. Wear Clean/Comfortable clothing the morning of surgery Do not apply any deodorants/lotions.   Remember to brush your teeth WITH YOUR REGULAR TOOTHPASTE.   Please read over the following fact sheets that you were given.

## 2020-11-27 ENCOUNTER — Encounter (HOSPITAL_COMMUNITY): Payer: Self-pay

## 2020-11-27 ENCOUNTER — Encounter (HOSPITAL_COMMUNITY)
Admission: RE | Admit: 2020-11-27 | Discharge: 2020-11-27 | Disposition: A | Discharge status: Home / Self Care | Payer: BC Managed Care – PPO | Source: Ambulatory Visit | Attending: Surgery | Admitting: Surgery

## 2020-11-27 ENCOUNTER — Other Ambulatory Visit: Payer: Self-pay

## 2020-11-27 DIAGNOSIS — Z01812 Encounter for preprocedural laboratory examination: Secondary | ICD-10-CM | POA: Diagnosis present

## 2020-11-27 HISTORY — DX: Other intervertebral disc displacement, lumbar region: M51.26

## 2020-11-27 LAB — BASIC METABOLIC PANEL
Anion gap: 8 (ref 5–15)
BUN: 8 mg/dL (ref 6–20)
CO2: 24 mmol/L (ref 22–32)
Calcium: 9.7 mg/dL (ref 8.9–10.3)
Chloride: 104 mmol/L (ref 98–111)
Creatinine, Ser: 1.32 mg/dL — ABNORMAL HIGH (ref 0.61–1.24)
GFR, Estimated: >60 mL/min (ref >60)
Glucose, Bld: 100 mg/dL — ABNORMAL HIGH (ref 70–99)
Potassium: 3.8 mmol/L (ref 3.5–5.1)
Sodium: 136 mmol/L (ref 135–145)

## 2020-11-27 LAB — CBC
HCT: 41.2 % (ref 39.0–52.0)
Hemoglobin: 13.4 g/dL (ref 13.0–17.0)
MCH: 26.1 pg (ref 26.0–34.0)
MCHC: 32.5 g/dL (ref 30.0–36.0)
MCV: 80.3 fL (ref 80.0–100.0)
Platelets: 258 10*3/uL (ref 150–400)
RBC: 5.13 MIL/uL (ref 4.22–5.81)
RDW: 16.1 % — ABNORMAL HIGH (ref 11.5–15.5)
WBC: 7.8 10*3/uL (ref 4.0–10.5)
nRBC: 0 % (ref 0.0–0.2)

## 2020-11-27 NOTE — Progress Notes (Addendum)
PCP - Jillyn Ledger FNP Cardiologist - pt denies  PPM/ICD - n/a  Chest x-ray - n/a EKG - 08/26/20 Stress Test - n/a ECHO - n/a Cardiac Cath - n/a  Sleep Study - pt denies  Fasting Blood Sugar - n/a  Blood Thinner Instructions: n/a Aspirin Instructions: n/a  NPO after midnight  COVID TEST- will go through drive thru testing on 12/02/20  Anesthesia review: no  **pt added that his MD said that he will be starting another medication on 7/29 but he is not sure what the name is. Pt stopped by provider's office after PAT appointment and left a note for the doctor to call PAT office and provide name of medication. Pt also says that he was told he may possibly get admitted 2 days prior to surgery and that his MD will update him.   Patient denies shortness of breath, fever, cough and chest pain at PAT appointment   All instructions explained to the patient, with a verbal understanding of the material. Patient agrees to go over the instructions while at home for a better understanding. Patient also instructed to self quarantine after being tested for COVID-19. The opportunity to ask questions was provided.

## 2020-12-02 ENCOUNTER — Other Ambulatory Visit: Payer: Self-pay

## 2020-12-02 DIAGNOSIS — D447 Neoplasm of uncertain behavior of aortic body and other paraganglia: Secondary | ICD-10-CM | POA: Diagnosis present

## 2020-12-03 ENCOUNTER — Observation Stay (HOSPITAL_COMMUNITY)
Admission: AD | Admit: 2020-12-03 | Discharge: 2020-12-04 | Disposition: A | Discharge status: Home / Self Care | Payer: BC Managed Care – PPO | Source: Ambulatory Visit | Attending: Surgery | Admitting: Surgery

## 2020-12-03 DIAGNOSIS — Z79899 Other long term (current) drug therapy: Secondary | ICD-10-CM | POA: Diagnosis not present

## 2020-12-03 DIAGNOSIS — Z87891 Personal history of nicotine dependence: Secondary | ICD-10-CM | POA: Diagnosis not present

## 2020-12-03 DIAGNOSIS — R109 Unspecified abdominal pain: Secondary | ICD-10-CM | POA: Insufficient documentation

## 2020-12-03 DIAGNOSIS — D447 Neoplasm of uncertain behavior of aortic body and other paraganglia: Principal | ICD-10-CM | POA: Diagnosis present

## 2020-12-03 DIAGNOSIS — R1900 Intra-abdominal and pelvic swelling, mass and lump, unspecified site: Secondary | ICD-10-CM | POA: Diagnosis present

## 2020-12-03 LAB — PROTIME-INR
INR: 1 (ref 0.8–1.2)
Prothrombin Time: 13.5 seconds (ref 11.4–15.2)

## 2020-12-03 LAB — BASIC METABOLIC PANEL
Anion gap: 8 (ref 5–15)
BUN: 12 mg/dL (ref 6–20)
CO2: 25 mmol/L (ref 22–32)
Calcium: 9.9 mg/dL (ref 8.9–10.3)
Chloride: 107 mmol/L (ref 98–111)
Creatinine, Ser: 1.32 mg/dL — ABNORMAL HIGH (ref 0.61–1.24)
GFR, Estimated: >60 mL/min (ref >60)
Glucose, Bld: 80 mg/dL (ref 70–99)
Potassium: 4.1 mmol/L (ref 3.5–5.1)
Sodium: 140 mmol/L (ref 135–145)

## 2020-12-03 LAB — CBC
HCT: 39.7 % (ref 39.0–52.0)
Hemoglobin: 12.7 g/dL — ABNORMAL LOW (ref 13.0–17.0)
MCH: 25.9 pg — ABNORMAL LOW (ref 26.0–34.0)
MCHC: 32 g/dL (ref 30.0–36.0)
MCV: 80.9 fL (ref 80.0–100.0)
Platelets: 258 10*3/uL (ref 150–400)
RBC: 4.91 MIL/uL (ref 4.22–5.81)
RDW: 16.1 % — ABNORMAL HIGH (ref 11.5–15.5)
WBC: 8.3 10*3/uL (ref 4.0–10.5)
nRBC: 0 % (ref 0.0–0.2)

## 2020-12-03 MED ORDER — ONDANSETRON 4 MG PO TBDP: 4.0000 mg | ORAL_TABLET | Freq: Four times a day (QID) | ORAL | Status: DC | PRN

## 2020-12-03 MED ORDER — ONDANSETRON HCL 4 MG/2ML IJ SOLN: 4.0000 mg | Freq: Four times a day (QID) | INTRAMUSCULAR | Status: DC | PRN

## 2020-12-03 MED ORDER — DOXAZOSIN MESYLATE 8 MG PO TABS
8.0000 mg | ORAL_TABLET | Freq: Every day | ORAL | Status: DC
  Filled 2020-12-03: qty 1

## 2020-12-03 MED ORDER — DOXAZOSIN MESYLATE 4 MG PO TABS
6.0000 mg | ORAL_TABLET | Freq: Every evening | ORAL | Status: DC
  Administered 2020-12-03: 6 mg via ORAL
  Filled 2020-12-03 (×2): qty 1

## 2020-12-03 MED ORDER — ATENOLOL 25 MG PO TABS
25.0000 mg | ORAL_TABLET | Freq: Every day | ORAL | Status: DC
  Administered 2020-12-04: 25 mg via ORAL
  Filled 2020-12-03: qty 1

## 2020-12-03 MED ORDER — ACETAMINOPHEN 500 MG PO TABS: 1000.0000 mg | ORAL_TABLET | Freq: Four times a day (QID) | ORAL | Status: DC | PRN

## 2020-12-03 MED ORDER — SIMETHICONE 80 MG PO CHEW: 80.0000 mg | CHEWABLE_TABLET | Freq: Four times a day (QID) | ORAL | Status: DC | PRN

## 2020-12-03 MED ORDER — DIPHENHYDRAMINE HCL 50 MG/ML IJ SOLN: 12.5000 mg | Freq: Four times a day (QID) | INTRAMUSCULAR | Status: DC | PRN

## 2020-12-03 NOTE — H&P (Signed)
Bridgett Larsson 1980/06/08  [id number].    Chief Complaint/Reason for Consult: preadmit for surgery  HPI:  Mr. Olshansky is a 40 yo male with a retroperitoneal hereditary paraganglioma. He initially presented in April with abdominal pain and was incidentally found to have a left retroperitoneal mass on CT scan, consistent with a paraganglioma. Serum catecholamines and metanephrines were elevated, consistent with a functional paragangioma. This was confirmed on an MIBG PET scan on 6/15. The patient has been evaluated by endocrinology as well. He began alpha blockade with doxazosin on 7/25, and is now on '4mg'$  daily. He started a beta blockade with atenolol '25mg'$  daily on 7/31. He is to undergo resection of the mass on 8/4 and is being preadmitted for preop hemodynamic monitoring and to ensure adequate alpha/beta blockade prior to surgery.   ROS: ROS: Please see HPI, otherwise all other systems have been reviewed and are negative, except some minor back pain from something that happened at work.  Family History  Problem Relation Age of Onset   Pancreatic cancer Maternal Grandmother        dx early 69s   Other Half-Brother        paternal; paraganglioma and pheochromocytoma; SDHD mutation    Other Half-Brother        paternal; ? paraganglioma   Hypertension Father    Prostate cancer Paternal Grandfather        d. 12s    Past Medical History:  Diagnosis Date   Family history of pancreatic cancer 09/09/2020   Family history of pheochromocytoma 09/09/2020   Lumbar herniated disc    Monoallelic mutation of SDHD gene 10/01/2020    Past Surgical History:  Procedure Laterality Date   HIP SURGERY      Social History:  reports that he quit smoking about 4 months ago. His smoking use included cigarettes. He has never used smokeless tobacco. He reports current alcohol use. He reports that he does not use drugs.  Allergies: No Known Allergies  Medications Prior to Admission  Medication  Sig Dispense Refill   atenolol (TENORMIN) 25 MG tablet Take 25 mg by mouth in the morning.     doxazosin (CARDURA) 2 MG tablet Take 2 mg by mouth at bedtime.     hydrOXYzine (ATARAX/VISTARIL) 25 MG tablet Take 1 tablet (25 mg total) by mouth every 6 (six) hours. (Patient not taking: Reported on 12/03/2020) 12 tablet 0   omeprazole (PRILOSEC) 40 MG capsule Take 1 capsule (40 mg total) by mouth daily. (Patient not taking: Reported on 12/03/2020) 30 capsule 1   ondansetron (ZOFRAN-ODT) 4 MG disintegrating tablet Take 1 tablet (4 mg total) by mouth every 6 (six) hours as needed for nausea. (Patient not taking: Reported on 12/03/2020) 20 tablet 0   oxyCODONE (OXY IR/ROXICODONE) 5 MG immediate release tablet Take 1 tablet (5 mg total) by mouth every 6 (six) hours as needed. (Patient not taking: Reported on 12/03/2020) 10 tablet 0     Physical Exam: Blood pressure 108/76, pulse (!) 56, temperature 97.9 F (36.6 C), temperature source Oral, resp. rate 18, SpO2 100 %. General: pleasant, WD, WN black male who is laying in bed in NAD HEENT: head is normocephalic, atraumatic.  Sclera are noninjected.  PERRL.  Ears and nose without any masses or lesions.  Mouth is pink and moist Heart: regular, rate, and rhythm.  Normal s1,s2. No obvious murmurs, gallops, or rubs noted.  Palpable radial and pedal pulses bilaterally Lungs: CTAB, no wheezes, rhonchi, or  rales noted.  Respiratory effort nonlabored Abd: soft, NT, ND, +BS, no masses, hernias, or organomegaly MS: all 4 extremities are symmetrical with no cyanosis, clubbing, or edema. Skin: warm and dry with no masses, lesions, or rashes Neuro: Cranial nerves 2-12 grossly intact, sensation is normal throughout Psych: A&Ox3 with an appropriate affect.   No results found for this or any previous visit (from the past 48 hour(s)). No results found.    Assessment/Plan 40 yo male with a hereditary functional paraganglioma.  - Continue doxazosin, increase to '6mg'$  for  goal SBP 90-110 - Continue atenolol '25mg'$  daily, goal HR <80 - Regular diet - IVF bolus prn for orthostasis - VTE: SCDs - Admit to inpatient  Henreitta Cea, PA-C Michaelle Birks, [facility] General, Hepatobiliary and Pancreatic Surgery 12/03/20 4:29 PM

## 2020-12-03 NOTE — Progress Notes (Signed)
Patient arrived to [facility] room 22. Alert and oriented x4. Pain level 0. Bed in lowest position, call light in reach. Will continue to monitor patient.

## 2020-12-03 NOTE — Progress Notes (Signed)
Vitals on arrival   12/03/20 1417  Vitals  Temp 97.9 F (36.6 C)  Temp Source Oral  BP 108/76  MAP (mmHg) 87  BP Location Left Arm  BP Method Automatic  Patient Position (if appropriate) Sitting  Pulse Rate (!) 56  Pulse Rate Source Dinamap  Resp 18  MEWS COLOR  MEWS Score Color Green  Oxygen Therapy  SpO2 100 %  O2 Device Room Air  MEWS Score  MEWS Temp 0  MEWS Systolic 0  MEWS Pulse 0  MEWS RR 0  MEWS LOC 0  MEWS Score 0

## 2020-12-04 DIAGNOSIS — D447 Neoplasm of uncertain behavior of aortic body and other paraganglia: Secondary | ICD-10-CM | POA: Diagnosis not present

## 2020-12-04 LAB — SARS CORONAVIRUS 2 (TAT 6-24 HRS): SARS Coronavirus 2: NEGATIVE

## 2020-12-04 LAB — TYPE AND SCREEN
ABO/RH(D): O POS
Antibody Screen: NEGATIVE

## 2020-12-04 MED ORDER — DOXAZOSIN MESYLATE 4 MG PO TABS: 10.0000 mg | ORAL_TABLET | Freq: Every day | ORAL | 0 refills | Status: DC

## 2020-12-04 MED ORDER — DOXAZOSIN MESYLATE 8 MG PO TABS
10.0000 mg | ORAL_TABLET | Freq: Every day | ORAL | Status: DC
  Administered 2020-12-04: 10 mg via ORAL
  Filled 2020-12-04: qty 1

## 2020-12-04 MED ORDER — CHLORHEXIDINE GLUCONATE CLOTH 2 % EX PADS: 6.0000 | MEDICATED_PAD | Freq: Once | CUTANEOUS | Status: DC

## 2020-12-04 MED ORDER — ENSURE PRE-SURGERY PO LIQD: 296.0000 mL | Freq: Once | ORAL | Status: DC

## 2020-12-04 MED ORDER — ATENOLOL 25 MG PO TABS: 25.0000 mg | ORAL_TABLET | Freq: Every morning | ORAL | 0 refills | Status: DC

## 2020-12-04 MED ORDER — ENSURE PRE-SURGERY PO LIQD: 592.0000 mL | Freq: Once | ORAL | Status: DC

## 2020-12-04 NOTE — Progress Notes (Signed)
Patient discharged to home with wife with all belongings. Atenol and Doxazosin medication given before discharge as requested by MD.

## 2020-12-04 NOTE — Progress Notes (Signed)
       Subjective: Patient admitted yesterday afternoon. SBP at admission was 108, similar to home BP checks for the last few days, however overnight SBP has been in 110s and as high as 135. HR has been 50s-60s. Patient denies symptoms of orthostasis.   Objective: Vital signs in last 24 hours: Temp:  [97.4 F (36.3 C)-98.2 F (36.8 C)] 97.4 F (36.3 C) (08/03 0512) Pulse Rate:  [55-63] 60 (08/03 0512) Resp:  [16-18] 16 (08/03 0512) BP: (108-128)/(67-76) 114/72 (08/03 0512) SpO2:  [100 %] 100 % (08/03 0512)    Intake/Output from previous day: No intake/output data recorded. Intake/Output this shift: No intake/output data recorded.  PE: General: resting comfortably, NAD Neuro: alert and oriented, no focal deficits Resp: normal work of breathing, lungs CTAB Abdomen: soft, nondistended, nontender to palpation. Extremities: warm and well-perfused   Lab Results:  Recent Labs    12/03/20 1540  WBC 8.3  HGB 12.7*  HCT 39.7  PLT 258   BMET Recent Labs    12/03/20 1540  NA 140  K 4.1  CL 107  CO2 25  GLUCOSE 80  BUN 12  CREATININE 1.32*  CALCIUM 9.9   PT/INR Recent Labs    12/03/20 1540  LABPROT 13.5  INR 1.0   CMP     Component Value Date/Time   NA 140 12/03/2020 1540   K 4.1 12/03/2020 1540   CL 107 12/03/2020 1540   CO2 25 12/03/2020 1540   GLUCOSE 80 12/03/2020 1540   BUN 12 12/03/2020 1540   CREATININE 1.32 (H) 12/03/2020 1540   CALCIUM 9.9 12/03/2020 1540   PROT 7.6 08/25/2020 1145   ALBUMIN 4.4 08/25/2020 1145   AST 28 08/25/2020 1145   ALT 47 (H) 08/25/2020 1145   ALKPHOS 62 08/25/2020 1145   BILITOT 0.7 08/25/2020 1145   GFRNONAA >60 12/03/2020 1540   Lipase     Component Value Date/Time   LIPASE 37 08/25/2020 1145       Studies/Results: No results found.  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 Preadmitted for hemodynamic monitoring and hydration. - Alpha blockade started 9 days ago, SBP is still intermittently above goal of 90-110. Will increase doxazosin to '10mg'$  this morning. Continue atenolol '25mg'$  daily. - Check orthostatic vital signs this morning - If blood pressure is still too high tomorrow morning, will need to postpone surgery in order to obtain an adequate alpha blockade. I discussed with the patient this morning that is essential to ensure he is adequately blocked before proceeding with surgery. - Clear liquids after midnight tonight, NPO at 4am per ERAS protocol - VTE: SCDs, ambulate. - Dispo: inpatient, med-surg   LOS: 1 day    Michaelle Birks, MD [facility] General, Hepatobiliary and Pancreatic Surgery 12/04/20 7:54 AM

## 2020-12-04 NOTE — Discharge Instructions (Signed)
Your surgery will be rescheduled in 1-2 weeks. We will contact you about a surgery date and will admit you 1-2 days prior to surgery.  Medications: Please take your blood pressure mediations as follows: Atenolol: Take 1 tablet (25 mg) every morning. Doxazosin (Cardura): Please discard the doxazosin tablets you were given prior to your admission to the hospital. Your new prescription will have '4mg'$  in each tablet. Please take 2.5 tablets (10 mg total) every morning starting on Thursday, 8/4. Continue to check your blood pressure 1-2 times daily and keep a log.   If you have ANY questions about your medications or your surgery, please call the [facility] office at [phone] and ask to speak with Dr. Zenia Resides.

## 2020-12-04 NOTE — Progress Notes (Signed)
Patient's SBP has been persistently above 120 today. On orthostatic vitals check his SBP was 119 in the standing position and did not drop significantly from sitting/supine position. He has no symptoms of orthostasis. I had a discussion at bedside with the patient and his wife. His blood pressure has been persistently higher than our ideal goal of SBP between 90 and [id number], and certainly no higher than 120. I think he needs more time to achieve an adequate alpha blockade and recommend postponing his surgery. Will discharge him home today on '10mg'$  doxazosin daily and atenolol '25mg'$  daily. HR is at goal. He will continue checking his blood pressure 1-2 times daily at home and keep a log. I will follow up with him via phone on Friday morning to continue titrating the alpha blocker. Surgery to be tentatively rescheduled in 1-2 weeks.  Michaelle Birks, MD [facility] General, Hepatobiliary and Pancreatic Surgery 12/04/20 10:36 AM

## 2020-12-04 NOTE — Discharge Summary (Signed)
Physician Discharge Summary   Patient ID: Kenneth Blackwell 40 y.o. 1980-12-28  Admit date: 12/03/2020  Discharge date and time: 12/04/2020 11:17 AM   Admitting Physician: Dwan Bolt, MD   Discharge Physician: Michaelle Birks, MD  Admission Diagnoses: Extra-adrenal paraganglioma [facility]) [D44.7]  Discharge Diagnoses: Extra-adrenal paraganglioma  Admission Condition: good  Discharged Condition: good  Indication for Admission: Kenneth Blackwell is a 40 yo male who was incidentally found several months ago to have a hereditary extra-adrenal paraganglioma. Endocrine workup showed that this is a hyperfunctioning tumor. He was scheduled to undergo resection on 12/05/20. Nine days preoperatively he began alpha blockade with doxazosin, and began beta blockade with atenolol 4 days prior to scheduled surgery. He was preadmitted on 8/2 for preop hemodynamic monitoring and hydration.  Hospital Course: The patient was admitted for preoperative monitoring on 8/2. At admission his SBP was 108 and HR was 60, with no orthostatic symptoms. His doxazosin was uptitrated to '6mg'$  the evening of admission. The following morning on 8/3 he had had multiple SBP measurements >120. Orthostatic vitals were check and the patient did not have orthostasis. Due to concern for inadequate alpha blockade, the decision was made to postpone the patient's surgery by 1-2 weeks. He will be discharged home and we will continue uptitrating his alpha blocker as an outpatient. He was given specific verbal and written instructions for his discharge medication regimen and all questions were answered prior to discharge.   Consults: None  Significant Diagnostic Studies: None  Treatments: cardiac meds: atenolol and doxazosin  Discharge Exam: General: resting comfortably, NAD Neuro: alert and oriented Resp: normal work of breathing on room air Abdomen: soft, nondistended, nontender Extremities: warm and well-perfused, no  deformities  Disposition: Discharge disposition: 01-Home or Self Care       Patient Instructions:  Allergies as of 12/04/2020   No Known Allergies      Medication List     STOP taking these medications    oxyCODONE 5 MG immediate release tablet Commonly known as: Oxy IR/ROXICODONE       TAKE these medications    atenolol 25 MG tablet Commonly known as: TENORMIN Take 1 tablet (25 mg total) by mouth in the morning.   doxazosin 4 MG tablet Commonly known as: CARDURA Take 2.5 tablets (10 mg total) by mouth daily. What changed:  medication strength how much to take when to take this       ASK your doctor about these medications    hydrOXYzine 25 MG tablet Commonly known as: ATARAX/VISTARIL Take 1 tablet (25 mg total) by mouth every 6 (six) hours.   omeprazole 40 MG capsule Commonly known as: PRILOSEC Take 1 capsule (40 mg total) by mouth daily.   ondansetron 4 MG disintegrating tablet Commonly known as: ZOFRAN-ODT Take 1 tablet (4 mg total) by mouth every 6 (six) hours as needed for nausea.       Activity: activity as tolerated Diet: regular diet and encourage fluids  Follow-up with via phone in 2 days for blood pressure check.  Signed: Dwan Bolt 12/04/2020 12:42 PM

## 2020-12-05 ENCOUNTER — Inpatient Hospital Stay (HOSPITAL_COMMUNITY): Admission: RE | Admit: 2020-12-05 | Payer: BC Managed Care – PPO | Source: Home / Self Care | Admitting: Surgery

## 2020-12-05 ENCOUNTER — Encounter (HOSPITAL_COMMUNITY): Admission: RE | Payer: Self-pay | Source: Home / Self Care

## 2020-12-05 LAB — TYPE AND SCREEN
ABO/RH(D): O POS
Antibody Screen: NEGATIVE

## 2020-12-05 SURGERY — EXCISION, MASS, RETROPERITONEUM
Anesthesia: General

## 2020-12-13 NOTE — H&P (Addendum)
[employer] [birth year]  [id number].    HPI:  Kenneth Blackwell is a 40 yo male with a retroperitoneal hereditary paraganglioma. He initially presented in April with abdominal pain and was incidentally found to have a left retroperitoneal mass on CT scan, consistent with a paraganglioma. Serum catecholamines and metanephrines were elevated, consistent with a functional paragangioma. This was confirmed on an MIBG PET scan on 6/15. The patient has been evaluated by endocrinology as well. He began alpha blockade with doxazosin on 7/25, and beta blockade with atenolol on 7/31. He was initially scheduled to undergo surgery on 8/4 but blood pressure was not at goal the day prior to surgery so the procedure was postponed until 8/15 to allow further alpha blockade. He is admitted today for preop hemodynamic monitoring and IV fluid hydration. His SBP at home for the past week has been mostly between 105 and 110 mmHg. His doxazosin was increased to '10mg'$  on 8/12, and he continues on atenolol at '25mg'$  daily. He has reported occasional mild dizziness at home as his alpha blocker has been uptitrated.  He states he has some mild anxiety concerning upcoming surgery as well as a "stuffy nose" that has been present for at least several days. He was told he had sinus inflammation and had been using nasal spray but not recently  ROS: Review of Systems  Constitutional:  Negative for chills and fever.  Respiratory:  Negative for cough, shortness of breath and wheezing.   Cardiovascular:  Negative for chest pain and palpitations.  Gastrointestinal:  Negative for abdominal pain, nausea and vomiting.  Genitourinary: Negative.    Family History  Problem Relation Age of Onset   Pancreatic cancer Maternal Grandmother        dx early 28s   Other Half-Brother        paternal; paraganglioma and pheochromocytoma; SDHD mutation    Other Half-Brother        paternal; ? paraganglioma   Hypertension Father     Prostate cancer Paternal Grandfather        d. 44s    Past Medical History:  Diagnosis Date   Family history of pancreatic cancer 09/09/2020   Family history of pheochromocytoma 09/09/2020   Lumbar herniated disc    Monoallelic mutation of SDHD gene 10/01/2020    Past Surgical History:  Procedure Laterality Date   HIP SURGERY      Social History:  reports that he quit smoking about 4 months ago. His smoking use included cigarettes. He has never used smokeless tobacco. He reports current alcohol use. He reports that he does not use drugs.  Allergies: No Known Allergies  Medications Prior to Admission  Medication Sig Dispense Refill   atenolol (TENORMIN) 25 MG tablet Take 1 tablet (25 mg total) by mouth in the morning. 30 tablet 0   doxazosin (CARDURA) 4 MG tablet Take 2.5 tablets (10 mg total) by mouth daily. 30 tablet 0   hydrOXYzine (ATARAX/VISTARIL) 25 MG tablet Take 1 tablet (25 mg total) by mouth every 6 (six) hours. (Patient not taking: Reported on 12/03/2020) 12 tablet 0   omeprazole (PRILOSEC) 40 MG capsule Take 1 capsule (40 mg total) by mouth daily. (Patient not taking: Reported on 12/03/2020) 30 capsule 1   ondansetron (ZOFRAN-ODT) 4 MG disintegrating tablet Take 1 tablet (4 mg total) by mouth every 6 (six) hours as needed for nausea. (Patient not taking: Reported on 12/03/2020) 20 tablet 0     Physical Exam: Blood pressure  131/74, pulse (!) 53, temperature 97.7 F (36.5 C), temperature source Oral, resp. rate 16, height 6' (1.829 m), weight 72.2 kg, SpO2 100 %. Physical Exam Vitals reviewed.  Constitutional:      General: He is not in acute distress.    Appearance: Normal appearance. He is not diaphoretic.  HENT:     Head: Normocephalic and atraumatic.     Right Ear: External ear normal.     Left Ear: External ear normal.     Nose: Nose normal.     Mouth/Throat:     Mouth: Mucous membranes are dry.  Eyes:     General: No scleral icterus.       Right eye: No  discharge.        Left eye: No discharge.     Extraocular Movements: Extraocular movements intact.     Conjunctiva/sclera: Conjunctivae normal.  Cardiovascular:     Rate and Rhythm: Regular rhythm. Bradycardia present.     Pulses: Normal pulses.     Heart sounds: Normal heart sounds.  Pulmonary:     Effort: Pulmonary effort is normal. No respiratory distress.     Breath sounds: Normal breath sounds. No wheezing.  Abdominal:     General: There is no distension.     Palpations: Abdomen is soft. There is no mass.     Tenderness: There is no abdominal tenderness. There is no guarding or rebound.     Hernia: No hernia is present.  Musculoskeletal:        General: Normal range of motion.     Right lower leg: No edema.     Left lower leg: No edema.  Skin:    General: Skin is warm and dry.     Capillary Refill: Capillary refill takes less than 2 seconds.     Findings: No rash.  Neurological:     Mental Status: He is alert and oriented to person, place, and time.  Psychiatric:        Mood and Affect: Mood normal.        Behavior: Behavior normal.     Results for orders placed or performed during the hospital encounter of 12/14/20 (from the past 48 hour(s))  Protime-INR     Status: None   Collection Time: 12/14/20  9:28 PM  Result Value Ref Range   Prothrombin Time 13.5 11.4 - 15.2 seconds   INR 1.0 0.8 - 1.2    Comment: (NOTE) INR goal varies based on device and disease states. Performed at [facility], [place] [address] [place], [place] [id number]   Basic metabolic panel     Status: Abnormal   Collection Time: 12/14/20  9:28 PM  Result Value Ref Range   Sodium 139 135 - 145 mmol/L   Potassium 3.6 3.5 - 5.1 mmol/L   Chloride 108 98 - 111 mmol/L   CO2 25 22 - 32 mmol/L   Glucose, Bld 115 (H) 70 - 99 mg/dL    Comment: Glucose reference range applies only to samples taken after fasting for at least 8 hours.   BUN 9 6 - 20 mg/dL   Creatinine, Ser 1.29 (H) 0.61 - 1.24 mg/dL    Calcium 9.4 8.9 - 10.3 mg/dL   GFR, Estimated >60 >60 mL/min    Comment: (NOTE) Calculated using the CKD-EPI Creatinine Equation (2021)    Anion gap 6 5 - 15    Comment: Performed at [facility] [address] [place],  [address]  CBC     Status: Abnormal   Collection Time: 12/14/20  9:28 PM  Result Value Ref Range   WBC 7.8 4.0 - 10.5 K/uL   RBC 4.83 4.22 - 5.81 MIL/uL   Hemoglobin 12.7 (L) 13.0 - 17.0 g/dL   HCT 38.7 (L) 39.0 - 52.0 %   MCV 80.1 80.0 - 100.0 fL   MCH 26.3 26.0 - 34.0 pg   MCHC 32.8 30.0 - 36.0 g/dL   RDW 15.8 (H) 11.5 - 15.5 %   Platelets 250 150 - 400 K/uL   nRBC 0.0 0.0 - 0.2 %    Comment: Performed at [facility] [address] [place], [place] [address]  Type and screen     Status: None   Collection Time: 12/15/20  2:25 AM  Result Value Ref Range   ABO/RH(D) O POS    Antibody Screen NEG    Sample Expiration      12/18/2020,2359 Performed at [facility], [place] [address] [place], [place] [address]    No results found.    Assessment/Plan 40 yo male with hypersecreting extraadrenal paraganglioma. - orthostatic vitals stable - Continue alpha blockade with doxazosin '10mg'$  daily - Atenolol '25mg'$  daily - IVF bolus prn for orthostasis - Regular diet - have ordered admission covid testing - VTE: SCDs - Dispo: admit to inpatient for planned surgery Monday, 8/15. ERAS protocol    Kenneth Blackwell, [facility] 12/15/20 10:36 AM

## 2020-12-14 ENCOUNTER — Inpatient Hospital Stay (HOSPITAL_COMMUNITY)
Admission: RE | Admit: 2020-12-14 | Discharge: 2020-12-19 | DRG: 358 | Disposition: A | Discharge status: Home / Self Care | Payer: BC Managed Care – PPO | Source: Ambulatory Visit | Attending: Surgery | Admitting: Surgery

## 2020-12-14 ENCOUNTER — Encounter (HOSPITAL_COMMUNITY): Payer: Self-pay | Admitting: Surgery

## 2020-12-14 ENCOUNTER — Other Ambulatory Visit: Payer: Self-pay

## 2020-12-14 DIAGNOSIS — Z800 Family history of malignant neoplasm of digestive organs: Secondary | ICD-10-CM | POA: Diagnosis not present

## 2020-12-14 DIAGNOSIS — M5126 Other intervertebral disc displacement, lumbar region: Secondary | ICD-10-CM | POA: Diagnosis present

## 2020-12-14 DIAGNOSIS — D483 Neoplasm of uncertain behavior of retroperitoneum: Secondary | ICD-10-CM | POA: Diagnosis present

## 2020-12-14 DIAGNOSIS — Z8042 Family history of malignant neoplasm of prostate: Secondary | ICD-10-CM | POA: Diagnosis not present

## 2020-12-14 DIAGNOSIS — D447 Neoplasm of uncertain behavior of aortic body and other paraganglia: Secondary | ICD-10-CM

## 2020-12-14 DIAGNOSIS — Z87891 Personal history of nicotine dependence: Secondary | ICD-10-CM | POA: Diagnosis not present

## 2020-12-14 DIAGNOSIS — Z20822 Contact with and (suspected) exposure to covid-19: Secondary | ICD-10-CM | POA: Diagnosis present

## 2020-12-14 DIAGNOSIS — K219 Gastro-esophageal reflux disease without esophagitis: Secondary | ICD-10-CM | POA: Diagnosis present

## 2020-12-14 LAB — BASIC METABOLIC PANEL
Anion gap: 6 (ref 5–15)
BUN: 9 mg/dL (ref 6–20)
CO2: 25 mmol/L (ref 22–32)
Calcium: 9.4 mg/dL (ref 8.9–10.3)
Chloride: 108 mmol/L (ref 98–111)
Creatinine, Ser: 1.29 mg/dL — ABNORMAL HIGH (ref 0.61–1.24)
GFR, Estimated: >60 mL/min (ref >60)
Glucose, Bld: 115 mg/dL — ABNORMAL HIGH (ref 70–99)
Potassium: 3.6 mmol/L (ref 3.5–5.1)
Sodium: 139 mmol/L (ref 135–145)

## 2020-12-14 LAB — CBC
HCT: 38.7 % — ABNORMAL LOW (ref 39.0–52.0)
Hemoglobin: 12.7 g/dL — ABNORMAL LOW (ref 13.0–17.0)
MCH: 26.3 pg (ref 26.0–34.0)
MCHC: 32.8 g/dL (ref 30.0–36.0)
MCV: 80.1 fL (ref 80.0–100.0)
Platelets: 250 10*3/uL (ref 150–400)
RBC: 4.83 MIL/uL (ref 4.22–5.81)
RDW: 15.8 % — ABNORMAL HIGH (ref 11.5–15.5)
WBC: 7.8 10*3/uL (ref 4.0–10.5)
nRBC: 0 % (ref 0.0–0.2)

## 2020-12-14 LAB — PROTIME-INR
INR: 1 (ref 0.8–1.2)
Prothrombin Time: 13.5 seconds (ref 11.4–15.2)

## 2020-12-14 MED ORDER — ATENOLOL 25 MG PO TABS
25.0000 mg | ORAL_TABLET | Freq: Every morning | ORAL | Status: DC
  Administered 2020-12-15: 25 mg via ORAL
  Filled 2020-12-14: qty 1

## 2020-12-14 MED ORDER — ONDANSETRON 4 MG PO TBDP: 4.0000 mg | ORAL_TABLET | Freq: Four times a day (QID) | ORAL | Status: DC | PRN

## 2020-12-14 MED ORDER — DOXAZOSIN MESYLATE 8 MG PO TABS
12.0000 mg | ORAL_TABLET | Freq: Every morning | ORAL | Status: DC
  Administered 2020-12-15: 12 mg via ORAL
  Filled 2020-12-14: qty 1

## 2020-12-14 MED ORDER — ONDANSETRON HCL 4 MG/2ML IJ SOLN: 4.0000 mg | Freq: Four times a day (QID) | INTRAMUSCULAR | Status: DC | PRN

## 2020-12-14 MED ORDER — DOXAZOSIN MESYLATE 8 MG PO TABS
10.0000 mg | ORAL_TABLET | Freq: Every morning | ORAL | Status: DC
  Filled 2020-12-14: qty 1

## 2020-12-15 LAB — PREPARE RBC (CROSSMATCH)

## 2020-12-15 MED ORDER — SODIUM CHLORIDE 0.9 % IV SOLN: INTRAVENOUS | Status: DC

## 2020-12-15 MED ORDER — ENSURE PRE-SURGERY PO LIQD
592.0000 mL | Freq: Once | ORAL | Status: AC
  Administered 2020-12-15: 592 mL via ORAL
  Filled 2020-12-15: qty 592

## 2020-12-15 MED ORDER — ENSURE PRE-SURGERY PO LIQD
296.0000 mL | Freq: Once | ORAL | Status: AC
  Administered 2020-12-16: 296 mL via ORAL
  Filled 2020-12-15 (×2): qty 296

## 2020-12-15 MED ORDER — SALINE SPRAY 0.65 % NA SOLN
1.0000 | NASAL | Status: DC | PRN
  Filled 2020-12-15: qty 44

## 2020-12-15 MED ORDER — DOXAZOSIN MESYLATE 4 MG PO TABS
4.0000 mg | ORAL_TABLET | Freq: Once | ORAL | Status: AC
  Administered 2020-12-15: 4 mg via ORAL
  Filled 2020-12-15: qty 1

## 2020-12-15 MED ORDER — DOXAZOSIN MESYLATE 8 MG PO TABS
16.0000 mg | ORAL_TABLET | ORAL | Status: DC
  Administered 2020-12-16: 16 mg via ORAL
  Filled 2020-12-15: qty 2

## 2020-12-15 MED ORDER — CHLORHEXIDINE GLUCONATE CLOTH 2 % EX PADS
6.0000 | MEDICATED_PAD | Freq: Once | CUTANEOUS | Status: AC
  Administered 2020-12-16: 6 via TOPICAL

## 2020-12-15 MED ORDER — CHLORHEXIDINE GLUCONATE CLOTH 2 % EX PADS: 6.0000 | MEDICATED_PAD | Freq: Once | CUTANEOUS | Status: DC

## 2020-12-16 ENCOUNTER — Encounter (HOSPITAL_COMMUNITY): Payer: Self-pay | Admitting: Surgery

## 2020-12-16 ENCOUNTER — Encounter (HOSPITAL_COMMUNITY)
Admission: RE | Disposition: A | Discharge status: Home / Self Care | Payer: Self-pay | Source: Ambulatory Visit | Attending: Surgery

## 2020-12-16 ENCOUNTER — Inpatient Hospital Stay (HOSPITAL_COMMUNITY): Payer: BC Managed Care – PPO | Admitting: Anesthesiology

## 2020-12-16 HISTORY — PX: RESECTION OF RETROPERITONEAL MASS: SHX6340

## 2020-12-16 LAB — BASIC METABOLIC PANEL
Anion gap: 7 (ref 5–15)
BUN: 7 mg/dL (ref 6–20)
CO2: 23 mmol/L (ref 22–32)
Calcium: 8.6 mg/dL — ABNORMAL LOW (ref 8.9–10.3)
Chloride: 108 mmol/L (ref 98–111)
Creatinine, Ser: 1.23 mg/dL (ref 0.61–1.24)
GFR, Estimated: >60 mL/min (ref >60)
Glucose, Bld: 153 mg/dL — ABNORMAL HIGH (ref 70–99)
Potassium: 3.5 mmol/L (ref 3.5–5.1)
Sodium: 138 mmol/L (ref 135–145)

## 2020-12-16 LAB — CBC
HCT: 32.3 % — ABNORMAL LOW (ref 39.0–52.0)
Hemoglobin: 10.2 g/dL — ABNORMAL LOW (ref 13.0–17.0)
MCH: 26 pg (ref 26.0–34.0)
MCHC: 31.6 g/dL (ref 30.0–36.0)
MCV: 82.4 fL (ref 80.0–100.0)
Platelets: 208 10*3/uL (ref 150–400)
RBC: 3.92 MIL/uL — ABNORMAL LOW (ref 4.22–5.81)
RDW: 15.8 % — ABNORMAL HIGH (ref 11.5–15.5)
WBC: 18.7 10*3/uL — ABNORMAL HIGH (ref 4.0–10.5)
nRBC: 0 % (ref 0.0–0.2)

## 2020-12-16 LAB — SARS CORONAVIRUS 2 (TAT 6-24 HRS): SARS Coronavirus 2: NEGATIVE

## 2020-12-16 SURGERY — EXCISION, MASS, RETROPERITONEUM
Anesthesia: General | Site: Abdomen

## 2020-12-16 MED ORDER — NOREPINEPHRINE 4 MG/250ML-% IV SOLN
0.0000 ug/min | INTRAVENOUS | Status: DC
  Filled 2020-12-16: qty 250

## 2020-12-16 MED ORDER — LIDOCAINE 2% (20 MG/ML) 5 ML SYRINGE
INTRAMUSCULAR | Status: AC
  Filled 2020-12-16: qty 5

## 2020-12-16 MED ORDER — SUGAMMADEX SODIUM 200 MG/2ML IV SOLN
INTRAVENOUS | Status: DC | PRN
  Administered 2020-12-16: 200 mg via INTRAVENOUS

## 2020-12-16 MED ORDER — ONDANSETRON HCL 4 MG/2ML IJ SOLN
INTRAMUSCULAR | Status: AC
  Filled 2020-12-16: qty 2

## 2020-12-16 MED ORDER — ACETAMINOPHEN 10 MG/ML IV SOLN
1000.0000 mg | Freq: Once | INTRAVENOUS | Status: DC | PRN
  Administered 2020-12-16: 1000 mg via INTRAVENOUS

## 2020-12-16 MED ORDER — MIDAZOLAM HCL 2 MG/2ML IJ SOLN
INTRAMUSCULAR | Status: AC
  Filled 2020-12-16: qty 2

## 2020-12-16 MED ORDER — DIPHENHYDRAMINE HCL 12.5 MG/5ML PO ELIX
12.5000 mg | ORAL_SOLUTION | Freq: Four times a day (QID) | ORAL | Status: DC | PRN
  Filled 2020-12-16: qty 5

## 2020-12-16 MED ORDER — ACETAMINOPHEN 160 MG/5ML PO SOLN: 325.0000 mg | Freq: Once | ORAL | Status: DC | PRN

## 2020-12-16 MED ORDER — LACTATED RINGERS IV SOLN: INTRAVENOUS | Status: DC | PRN

## 2020-12-16 MED ORDER — DEXAMETHASONE SODIUM PHOSPHATE 10 MG/ML IJ SOLN
INTRAMUSCULAR | Status: AC
  Filled 2020-12-16: qty 1

## 2020-12-16 MED ORDER — HEMOSTATIC AGENTS (NO CHARGE) OPTIME
TOPICAL | Status: DC | PRN
  Administered 2020-12-16: 1 via TOPICAL

## 2020-12-16 MED ORDER — HYDROMORPHONE HCL 1 MG/ML IJ SOLN
INTRAMUSCULAR | Status: AC
  Administered 2020-12-16: 0.5 mg via INTRAVENOUS
  Filled 2020-12-16: qty 1

## 2020-12-16 MED ORDER — ROCURONIUM BROMIDE 10 MG/ML (PF) SYRINGE
PREFILLED_SYRINGE | INTRAVENOUS | Status: DC | PRN
  Administered 2020-12-16: 70 mg via INTRAVENOUS

## 2020-12-16 MED ORDER — ALBUMIN HUMAN 5 % IV SOLN: INTRAVENOUS | Status: DC | PRN

## 2020-12-16 MED ORDER — PHENYLEPHRINE 40 MCG/ML (10ML) SYRINGE FOR IV PUSH (FOR BLOOD PRESSURE SUPPORT)
PREFILLED_SYRINGE | INTRAVENOUS | Status: DC | PRN
  Administered 2020-12-16 (×3): 80 ug via INTRAVENOUS

## 2020-12-16 MED ORDER — LACTATED RINGERS IV SOLN: INTRAVENOUS | Status: DC

## 2020-12-16 MED ORDER — PROPOFOL 10 MG/ML IV BOLUS
INTRAVENOUS | Status: AC
  Filled 2020-12-16: qty 40

## 2020-12-16 MED ORDER — LABETALOL HCL 5 MG/ML IV SOLN
10.0000 mg | INTRAVENOUS | Status: DC | PRN
  Administered 2020-12-16 (×2): 10 mg via INTRAVENOUS
  Filled 2020-12-16 (×2): qty 4

## 2020-12-16 MED ORDER — ACETAMINOPHEN 500 MG PO TABS
1000.0000 mg | ORAL_TABLET | Freq: Three times a day (TID) | ORAL | Status: DC
  Administered 2020-12-16 – 2020-12-19 (×9): 1000 mg via ORAL
  Filled 2020-12-16 (×9): qty 2

## 2020-12-16 MED ORDER — NALOXONE HCL 0.4 MG/ML IJ SOLN: 0.4000 mg | INTRAMUSCULAR | Status: DC | PRN

## 2020-12-16 MED ORDER — PHENYLEPHRINE HCL-NACL 20-0.9 MG/250ML-% IV SOLN
INTRAVENOUS | Status: AC
  Filled 2020-12-16: qty 250

## 2020-12-16 MED ORDER — HYDROMORPHONE HCL 1 MG/ML IJ SOLN
0.2500 mg | INTRAMUSCULAR | Status: DC | PRN
  Administered 2020-12-16 (×2): 0.25 mg via INTRAVENOUS
  Administered 2020-12-16: 0.5 mg via INTRAVENOUS

## 2020-12-16 MED ORDER — LIDOCAINE 2% (20 MG/ML) 5 ML SYRINGE
INTRAMUSCULAR | Status: DC | PRN
  Administered 2020-12-16: 40 mg via INTRAVENOUS

## 2020-12-16 MED ORDER — ONDANSETRON 4 MG PO TBDP: 4.0000 mg | ORAL_TABLET | Freq: Four times a day (QID) | ORAL | Status: DC | PRN

## 2020-12-16 MED ORDER — CEFAZOLIN SODIUM-DEXTROSE 2-3 GM-%(50ML) IV SOLR
INTRAVENOUS | Status: DC | PRN
  Administered 2020-12-16: 2 g via INTRAVENOUS

## 2020-12-16 MED ORDER — CALCIUM CHLORIDE 10 % IV SOLN
INTRAVENOUS | Status: AC
  Filled 2020-12-16: qty 10

## 2020-12-16 MED ORDER — AMISULPRIDE (ANTIEMETIC) 5 MG/2ML IV SOLN: 10.0000 mg | Freq: Once | INTRAVENOUS | Status: DC | PRN

## 2020-12-16 MED ORDER — CALCIUM CHLORIDE 10 % IV SOLN
INTRAVENOUS | Status: DC | PRN
  Administered 2020-12-16: 200 mg via INTRAVENOUS

## 2020-12-16 MED ORDER — PROMETHAZINE HCL 25 MG/ML IJ SOLN: 6.2500 mg | INTRAMUSCULAR | Status: DC | PRN

## 2020-12-16 MED ORDER — NITROPRUSSIDE SODIUM-NACL 20-0.9 MG/100ML-% IV SOLN
0.0000 ug/kg/min | INTRAVENOUS | Status: DC
  Filled 2020-12-16: qty 100

## 2020-12-16 MED ORDER — DEXAMETHASONE SODIUM PHOSPHATE 10 MG/ML IJ SOLN
INTRAMUSCULAR | Status: DC | PRN
  Administered 2020-12-16: 10 mg via INTRAVENOUS

## 2020-12-16 MED ORDER — FENTANYL CITRATE (PF) 250 MCG/5ML IJ SOLN
INTRAMUSCULAR | Status: DC | PRN
  Administered 2020-12-16: 100 ug via INTRAVENOUS
  Administered 2020-12-16 (×3): 50 ug via INTRAVENOUS

## 2020-12-16 MED ORDER — VASOPRESSIN 20 UNIT/ML IV SOLN
INTRAVENOUS | Status: DC | PRN
  Administered 2020-12-16 (×3): 2 [IU] via INTRAVENOUS

## 2020-12-16 MED ORDER — DIPHENHYDRAMINE HCL 50 MG/ML IJ SOLN: 12.5000 mg | Freq: Four times a day (QID) | INTRAMUSCULAR | Status: DC | PRN

## 2020-12-16 MED ORDER — HYDROMORPHONE HCL 1 MG/ML IJ SOLN
0.5000 mg | INTRAMUSCULAR | Status: DC | PRN
  Administered 2020-12-16: 1 mg via INTRAVENOUS
  Filled 2020-12-16: qty 1

## 2020-12-16 MED ORDER — ACETAMINOPHEN 325 MG PO TABS: 325.0000 mg | ORAL_TABLET | Freq: Once | ORAL | Status: DC | PRN

## 2020-12-16 MED ORDER — ENOXAPARIN SODIUM 40 MG/0.4ML IJ SOSY
40.0000 mg | PREFILLED_SYRINGE | INTRAMUSCULAR | Status: DC
  Filled 2020-12-16 (×3): qty 0.4

## 2020-12-16 MED ORDER — DOCUSATE SODIUM 100 MG PO CAPS
100.0000 mg | ORAL_CAPSULE | Freq: Two times a day (BID) | ORAL | Status: DC
  Administered 2020-12-17 – 2020-12-19 (×4): 100 mg via ORAL
  Filled 2020-12-16 (×5): qty 1

## 2020-12-16 MED ORDER — PROPOFOL 10 MG/ML IV BOLUS
INTRAVENOUS | Status: DC | PRN
  Administered 2020-12-16: 150 mg via INTRAVENOUS

## 2020-12-16 MED ORDER — ACETAMINOPHEN 10 MG/ML IV SOLN
INTRAVENOUS | Status: AC
  Filled 2020-12-16: qty 100

## 2020-12-16 MED ORDER — FENTANYL CITRATE (PF) 250 MCG/5ML IJ SOLN
INTRAMUSCULAR | Status: AC
  Filled 2020-12-16: qty 5

## 2020-12-16 MED ORDER — VASOPRESSIN 20 UNIT/ML IV SOLN
INTRAVENOUS | Status: AC
  Filled 2020-12-16: qty 1

## 2020-12-16 MED ORDER — CEFAZOLIN SODIUM 1 G IJ SOLR
INTRAMUSCULAR | Status: AC
  Filled 2020-12-16: qty 20

## 2020-12-16 MED ORDER — SODIUM CHLORIDE 0.9% FLUSH: 9.0000 mL | INTRAVENOUS | Status: DC | PRN

## 2020-12-16 MED ORDER — MIDAZOLAM HCL 2 MG/2ML IJ SOLN
INTRAMUSCULAR | Status: DC | PRN
  Administered 2020-12-16: 2 mg via INTRAVENOUS

## 2020-12-16 MED ORDER — METHOCARBAMOL 500 MG PO TABS
500.0000 mg | ORAL_TABLET | Freq: Four times a day (QID) | ORAL | Status: DC
  Administered 2020-12-16 – 2020-12-19 (×12): 500 mg via ORAL
  Filled 2020-12-16 (×12): qty 1

## 2020-12-16 MED ORDER — ONDANSETRON HCL 4 MG/2ML IJ SOLN
INTRAMUSCULAR | Status: DC | PRN
  Administered 2020-12-16: 4 mg via INTRAVENOUS

## 2020-12-16 MED ORDER — PHENYLEPHRINE HCL-NACL 20-0.9 MG/250ML-% IV SOLN
INTRAVENOUS | Status: DC | PRN
  Administered 2020-12-16: 40 ug/min via INTRAVENOUS

## 2020-12-16 MED ORDER — ATENOLOL 25 MG PO TABS
12.5000 mg | ORAL_TABLET | Freq: Every day | ORAL | Status: DC
  Administered 2020-12-17 – 2020-12-19 (×3): 12.5 mg via ORAL
  Filled 2020-12-16 (×2): qty 1
  Filled 2020-12-16: qty 0.5

## 2020-12-16 MED ORDER — ONDANSETRON HCL 4 MG/2ML IJ SOLN: 4.0000 mg | Freq: Four times a day (QID) | INTRAMUSCULAR | Status: DC | PRN

## 2020-12-16 MED ORDER — MEPERIDINE HCL 25 MG/ML IJ SOLN: 6.2500 mg | INTRAMUSCULAR | Status: DC | PRN

## 2020-12-16 MED ORDER — ROCURONIUM BROMIDE 10 MG/ML (PF) SYRINGE
PREFILLED_SYRINGE | INTRAVENOUS | Status: AC
  Filled 2020-12-16: qty 10

## 2020-12-16 MED ORDER — NITROGLYCERIN IN D5W 200-5 MCG/ML-% IV SOLN
0.0000 ug/min | INTRAVENOUS | Status: DC
  Filled 2020-12-16: qty 250

## 2020-12-16 MED ORDER — 0.9 % SODIUM CHLORIDE (POUR BTL) OPTIME
TOPICAL | Status: DC | PRN
  Administered 2020-12-16 (×2): 1000 mL

## 2020-12-16 MED ORDER — HYDROMORPHONE 1 MG/ML IV SOLN
INTRAVENOUS | Status: DC
  Administered 2020-12-16: 30 mg via INTRAVENOUS
  Administered 2020-12-16: 1.6 mg via INTRAVENOUS
  Administered 2020-12-17: 1.2 mg via INTRAVENOUS
  Administered 2020-12-17: 0 mg via INTRAVENOUS
  Filled 2020-12-16: qty 30

## 2020-12-16 MED ORDER — SODIUM CHLORIDE (PF) 0.9 % IJ SOLN
INTRAMUSCULAR | Status: AC
  Filled 2020-12-16: qty 10

## 2020-12-16 MED ORDER — ESMOLOL HCL 100 MG/10ML IV SOLN
INTRAVENOUS | Status: AC
  Filled 2020-12-16: qty 10

## 2020-12-16 SURGICAL SUPPLY — 57 items
ADH SKN CLS APL DERMABOND .7 (GAUZE/BANDAGES/DRESSINGS) ×2
APL PRP STRL LF DISP 70% ISPRP (MISCELLANEOUS) ×1
BAG COUNTER SPONGE SURGICOUNT (BAG) ×2 IMPLANT
BAG SPNG CNTER NS LX DISP (BAG) ×1
BLADE CLIPPER SURG (BLADE) IMPLANT
BOOT SUTURE AID YELLOW STND (SUTURE) ×1 IMPLANT
CANISTER SUCT 3000ML PPV (MISCELLANEOUS) ×2 IMPLANT
CHLORAPREP W/TINT 26 (MISCELLANEOUS) ×2 IMPLANT
CLIP VESOCCLUDE LG 6/CT (CLIP) ×1 IMPLANT
CLIP VESOCCLUDE MED 24/CT (CLIP) ×1 IMPLANT
CLIP VESOCCLUDE SM WIDE 24/CT (CLIP) ×1 IMPLANT
COVER SURGICAL LIGHT HANDLE (MISCELLANEOUS) ×2 IMPLANT
DERMABOND ADVANCED (GAUZE/BANDAGES/DRESSINGS) ×2
DERMABOND ADVANCED .7 DNX12 (GAUZE/BANDAGES/DRESSINGS) ×2 IMPLANT
DRAPE INCISE IOBAN 66X45 STRL (DRAPES) ×2 IMPLANT
DRAPE LAPAROSCOPIC ABDOMINAL (DRAPES) ×2 IMPLANT
DRAPE WARM FLUID 44X44 (DRAPES) ×2 IMPLANT
ELECT BLADE 6.5 EXT (BLADE) ×1 IMPLANT
ELECT CAUTERY BLADE 6.4 (BLADE) ×2 IMPLANT
ELECT PAD DSPR THERM+ ADLT (MISCELLANEOUS) ×1 IMPLANT
ELECT REM PT RETURN 9FT ADLT (ELECTROSURGICAL) ×2
ELECTRODE REM PT RTRN 9FT ADLT (ELECTROSURGICAL) ×1 IMPLANT
GLOVE SURG POLY MICRO LF SZ5.5 (GLOVE) ×1 IMPLANT
GLOVE SURG UNDER POLY LF SZ6 (GLOVE) ×2 IMPLANT
GOWN STRL REUS W/ TWL LRG LVL3 (GOWN DISPOSABLE) ×2 IMPLANT
GOWN STRL REUS W/TWL LRG LVL3 (GOWN DISPOSABLE) ×4
HAND PENCIL TRP OPTION (MISCELLANEOUS) ×1 IMPLANT
HANDLE SUCTION POOLE (INSTRUMENTS) ×1 IMPLANT
HEMOSTAT SURGICEL 2X4 FIBR (HEMOSTASIS) ×1 IMPLANT
KIT BASIN OR (CUSTOM PROCEDURE TRAY) ×2 IMPLANT
KIT TURNOVER KIT B (KITS) ×2 IMPLANT
LIGASURE IMPACT 36 18CM CVD LR (INSTRUMENTS) IMPLANT
NS IRRIG 1000ML POUR BTL (IV SOLUTION) ×4 IMPLANT
PACK GENERAL/GYN (CUSTOM PROCEDURE TRAY) ×2 IMPLANT
PAD ARMBOARD 7.5X6 YLW CONV (MISCELLANEOUS) ×2 IMPLANT
RETRACTOR WOUND ALXS 34CM XLRG (MISCELLANEOUS) IMPLANT
RTRCTR WOUND ALEXIS 34CM XLRG (MISCELLANEOUS) ×2
SLEEVE SUCTION CATH 165 (SLEEVE) ×2 IMPLANT
SPECIMEN JAR LARGE (MISCELLANEOUS) IMPLANT
SPONGE T-LAP 18X18 ~~LOC~~+RFID (SPONGE) ×4 IMPLANT
STAPLER VISISTAT 35W (STAPLE) IMPLANT
SUCTION POOLE HANDLE (INSTRUMENTS) ×2
SUT MNCRL AB 4-0 PS2 18 (SUTURE) ×1 IMPLANT
SUT PDS AB 1 TP1 96 (SUTURE) ×4 IMPLANT
SUT PROLENE 3 0 SH 48 (SUTURE) IMPLANT
SUT PROLENE 4 0 RB 1 (SUTURE) ×2
SUT PROLENE 4-0 RB1 .5 CRCL 36 (SUTURE) IMPLANT
SUT SILK 2 0 SH CR/8 (SUTURE) ×2 IMPLANT
SUT SILK 2 0 TIES 10X30 (SUTURE) ×2 IMPLANT
SUT SILK 3 0 SH CR/8 (SUTURE) ×2 IMPLANT
SUT SILK 3 0 TIES 10X30 (SUTURE) ×2 IMPLANT
SUT VIC AB 3-0 SH 18 (SUTURE) IMPLANT
SUT VIC AB 3-0 SH 27 (SUTURE) ×2
SUT VIC AB 3-0 SH 27X BRD (SUTURE) IMPLANT
TOWEL GREEN STERILE (TOWEL DISPOSABLE) ×2 IMPLANT
TRAY FOLEY MTR SLVR 16FR STAT (SET/KITS/TRAYS/PACK) ×1 IMPLANT
YANKAUER SUCT BULB TIP NO VENT (SUCTIONS) IMPLANT

## 2020-12-16 NOTE — Anesthesia Procedure Notes (Signed)
Central Venous Catheter Insertion Performed by: Effie Berkshire, MD, anesthesiologist Start/End8/15/2022 7:15 AM, 12/16/2020 7:25 AM Patient location: Pre-op. Preanesthetic checklist: patient identified, IV checked, site marked, risks and benefits discussed, surgical consent, monitors and equipment checked, pre-op evaluation, timeout performed and anesthesia consent Position: Trendelenburg Lidocaine 1% used for infiltration and patient sedated Hand hygiene performed , maximum sterile barriers used  and Seldinger technique used Catheter size: 8 Fr Total catheter length 16. Central line was placed.Double lumen Procedure performed using ultrasound guided technique. Ultrasound Notes:anatomy identified, needle tip was noted to be adjacent to the nerve/plexus identified, no ultrasound evidence of intravascular and/or intraneural injection and image(s) printed for medical record Attempts: 1 Following insertion, dressing applied, line sutured and Biopatch. Post procedure assessment: blood return through all ports  Patient tolerated the procedure well with no immediate complications.

## 2020-12-16 NOTE — Op Note (Signed)
Date: 12/16/20  Patient: Kenneth Blackwell. MRN: [id number]  Preoperative Diagnosis: Retroperitoneal paraganglioma Postoperative Diagnosis: Same  Procedure: Open excision of retroperitoneal mass  Surgeon: Michaelle Birks, MD Assistant: Armandina Gemma, MD  EBL: 20 mL  Anesthesia: General endotracheal  Specimens: Retroperitoneal mass  Indications: [name] is a 40 yo male who was incidentally found to have a 3cm left retroperitoneal mass on workup for acute abdominal pain. The mass was consistent with a paraganglioma on imaging, and he had multiple first-degree relatives with a history of paraganglioma. Serum metanephrines and catecholamines were elevated, and MIBG scan confirmed that the tumor was hypersecreting. After a thorough endocrine workup he was scheduled for surgical resection. He began preoperative alpha blockade about 3 weeks ago, and started beta blockade 5 days after beginning the alpha blockade.  Findings: Well-circumscribed encapsulated 3cm mass in the left retroperitoneum. Normal appendix.  Procedure details: Informed consent was obtained in the preoperative area prior to the procedure. The patient was brought to the operating room and placed on the table in the supine position. General anesthesia was induced and appropriate lines and drains were placed for intraoperative monitoring. Perioperative antibiotics were administered per SCIP guidelines. The abdomen was prepped and draped in the usual sterile fashion. A pre-procedure timeout was taken verifying patient identity, surgical site and procedure to be performed.  A midline skin incision was made and the subcutaneous tissue was divided with cautery. The fascia was opened along the linea alba and the peritoneal cavity was entered. A very small cautery injury was inadvertently created on the small bowel, involving the serosa only, and this was oversewn with 3-0 silk Lembert sutures. The falciform ligament was ligated with  2-0 silk ties and divided, and taken down off the abdominal wall. An Allexis wound protector and Bookwalter fixed retractor were placed. The liver and peritoneal surfaces were palpated and there was no evidence of metastatic disease. The colon was reflected cephalad and the ligament of Treitz was identified. The mass was palpable in the retroperitoneum to the left of the IMV. The peritoneum was opened to the left of the IMV over the mass using cautery. The mass was then visualized and appeared to be encapsulated. It was carefully dissected out from the surrounding tissue using gentle blunt dissection. The patient did not have any rise in blood pressure with manipulation of the mass. There were multiple small feeding blood vessels, which were clipped prior to division. The mass did not invade any surrounding structures. The mass was completely dissected out and closely inspected. The surrounding capsule was fully intact. The specimen was sent for routine pathology. The surgical site was irrigated and several small venous branches were clipped to obtain hemostasis. The surgical site appeared hemostatic and fibrillar was placed in the resection site.  The cecum and appendix were then examined as the patient had previously had concerns for possible early appendicitis on CT scan. The appendix was normal in appearance with no erythema, and on palpation did not feel firm or contain any masses. There were no surrounding adhesions. Because the patient had not had any symptoms consistent with appendicitis, the decision was made not to perform an appendectomy.  The surgical site was again inspected and appeared hemostatic. The retractors and wound protector were removed. The fascia was closed with running looped 1 PDS. Scarpa's layer was closed with running 3-0 Vicryl and the skin was closed with running subcuticular 4-0 monocryl. Dermabond was applied.  The patient tolerated the procedure well with no apparent  complications. All counts were correct x2 at the end of the procedure. The patient was extubated and taken to PACU in stable condition.  Michaelle Birks, MD 12/16/20 10:07 AM

## 2020-12-16 NOTE — Progress Notes (Signed)
    Day of Surgery  Subjective: SBP in 110s for last 24 hours. Cardura increased to '16mg'$  yesterday morning. No tachycardia.   Objective: Vital signs in last 24 hours: Temp:  [97.7 F (36.5 C)-97.9 F (36.6 C)] 97.9 F (36.6 C) (08/15 0300) Pulse Rate:  [53-63] 60 (08/15 0300) Resp:  [16-18] 18 (08/15 0300) BP: (114-131)/(67-78) 123/78 (08/15 0300) SpO2:  [100 %] 100 % (08/14 1954)    Intake/Output from previous day: 08/14 0701 - 08/15 0700 In: 1043.8 [P.O.:240; I.V.:803.8] Out: -  Intake/Output this shift: Total I/O In: 803.8 [I.V.:803.8] Out: -   PE: General: resting comfortably, NAD Neuro: alert and oriented, no focal deficits Resp: normal work of breathing on room air Abdomen: soft, nondistended, nontender to palpation Extremities: warm and well-perfused  Lab Results:  Recent Labs    12/14/20 2128  WBC 7.8  HGB 12.7*  HCT 38.7*  PLT 250   BMET Recent Labs    12/14/20 2128  NA 139  K 3.6  CL 108  CO2 25  GLUCOSE 115*  BUN 9  CREATININE 1.29*  CALCIUM 9.4   PT/INR Recent Labs    12/14/20 2128  LABPROT 13.5  INR 1.0   CMP     Component Value Date/Time   NA 139 12/14/2020 2128   K 3.6 12/14/2020 2128   CL 108 12/14/2020 2128   CO2 25 12/14/2020 2128   GLUCOSE 115 (H) 12/14/2020 2128   BUN 9 12/14/2020 2128   CREATININE 1.29 (H) 12/14/2020 2128   CALCIUM 9.4 12/14/2020 2128   PROT 7.6 08/25/2020 1145   ALBUMIN 4.4 08/25/2020 1145   AST 28 08/25/2020 1145   ALT 47 (H) 08/25/2020 1145   ALKPHOS 62 08/25/2020 1145   BILITOT 0.7 08/25/2020 1145   GFRNONAA >60 12/14/2020 2128   Lipase     Component Value Date/Time   LIPASE 37 08/25/2020 1145       Studies/Results: No results found.  Anti-infectives: Anti-infectives (From admission, onward)    None        Assessment/Plan 40 yo male with retroperitoneal paraganglioma. Has had 3 weeks of preoperative alpha and beta blockade. - Proceed to OR today for excision of  retroperitoneal mass, as well as appendectomy as patient initially presented with concern for appendicitis. - NPO for procedure - Received '16mg'$  doxazosin this morning - Transfer to ICU postoperatively for close hemodynamic monitoring    LOS: 2 days    Michaelle Birks, MD [facility] Surgery General, Hepatobiliary and Pancreatic Surgery 12/16/20 7:00 AM

## 2020-12-16 NOTE — Anesthesia Preprocedure Evaluation (Addendum)
Anesthesia Evaluation  Patient identified by MRN, date of birth, ID band Patient awake    Reviewed: Allergy & Precautions, NPO status , Patient's Chart, lab work & pertinent test results  Airway Mallampati: I  TM Distance: >3 FB Neck ROM: Full    Dental  (+) Teeth Intact, Dental Advisory Given   Pulmonary former smoker,    breath sounds clear to auscultation       Cardiovascular negative cardio ROS   Rhythm:Regular Rate:Normal     Neuro/Psych negative neurological ROS  negative psych ROS   GI/Hepatic Neg liver ROS, GERD  Medicated,  Endo/Other  negative endocrine ROS  Renal/GU Renal InsufficiencyRenal disease     Musculoskeletal negative musculoskeletal ROS (+)   Abdominal Normal abdominal exam  (+)   Peds  Hematology negative hematology ROS (+)   Anesthesia Other Findings   Reproductive/Obstetrics                            Anesthesia Physical Anesthesia Plan  ASA: 3  Anesthesia Plan: General   Post-op Pain Management:    Induction: Intravenous  PONV Risk Score and Plan: 3 and Ondansetron, Dexamethasone and Midazolam  Airway Management Planned: Oral ETT  Additional Equipment: Arterial line, CVP and Ultrasound Guidance Line Placement  Intra-op Plan:   Post-operative Plan: Extubation in OR  Informed Consent: I have reviewed the patients History and Physical, chart, labs and discussed the procedure including the risks, benefits and alternatives for the proposed anesthesia with the patient or authorized representative who has indicated his/her understanding and acceptance.     Dental advisory given  Plan Discussed with: CRNA  Anesthesia Plan Comments:       Anesthesia Quick Evaluation

## 2020-12-16 NOTE — Anesthesia Procedure Notes (Signed)
Procedure Name: Intubation Date/Time: 12/16/2020 7:49 AM Performed by: Kathryne Hitch, CRNA Pre-anesthesia Checklist: Patient identified, Emergency Drugs available, Suction available and Patient being monitored Patient Re-evaluated:Patient Re-evaluated prior to induction Oxygen Delivery Method: Circle system utilized Preoxygenation: Pre-oxygenation with 100% oxygen Induction Type: IV induction Ventilation: Mask ventilation without difficulty Laryngoscope Size: Mac and 4 Grade View: Grade I Tube type: Oral Tube size: 7.5 mm Number of attempts: 1 Airway Equipment and Method: Stylet and Oral airway Placement Confirmation: ETT inserted through vocal cords under direct vision, positive ETCO2 and breath sounds checked- equal and bilateral Secured at: 22 cm Tube secured with: Tape Dental Injury: Teeth and Oropharynx as per pre-operative assessment

## 2020-12-16 NOTE — Transfer of Care (Signed)
Immediate Anesthesia Transfer of Care Note  Patient: Kenneth Rumps Sr.  Procedure(s) Performed: EXCISION OF RETROPERITONEAL MASS PHEOCHROMOCYTOMA (Abdomen)  Patient Location: PACU  Anesthesia Type:General  Level of Consciousness: drowsy and patient cooperative  Airway & Oxygen Therapy: Patient Spontanous Breathing  Post-op Assessment: Report given to RN and Post -op Vital signs reviewed and stable  Post vital signs: Reviewed and stable  Last Vitals:  Vitals Value Taken Time  BP 130/61 12/16/20 0951  Temp    Pulse 92 12/16/20 0952  Resp 21 12/16/20 0952  SpO2 99 % 12/16/20 0952  Vitals shown include unvalidated device data.  Last Pain:  Vitals:   12/16/20 0300  TempSrc: Oral  PainSc:          Complications: No notable events documented.

## 2020-12-16 NOTE — Anesthesia Procedure Notes (Signed)
Arterial Line Insertion Start/End8/15/2022 7:20 AM, 12/16/2020 7:20 AM Performed by: Kathryne Hitch, CRNA, CRNA  Patient location: Pre-op. Preanesthetic checklist: patient identified, IV checked, site marked, risks and benefits discussed, surgical consent, monitors and equipment checked, pre-op evaluation, timeout performed and anesthesia consent Lidocaine 1% used for infiltration Right, radial was placed Catheter size: 20 G Hand hygiene performed  and maximum sterile barriers used   Attempts: 1 Procedure performed without using ultrasound guided technique. Following insertion, dressing applied. Post procedure assessment: normal and unchanged

## 2020-12-16 NOTE — Anesthesia Postprocedure Evaluation (Signed)
Anesthesia Post Note  Patient: Kenneth Blackwell.  Procedure(s) Performed: EXCISION OF RETROPERITONEAL MASS PHEOCHROMOCYTOMA (Abdomen)     Patient location during evaluation: PACU Anesthesia Type: General Level of consciousness: awake and alert Pain management: pain level controlled Vital Signs Assessment: post-procedure vital signs reviewed and stable Respiratory status: spontaneous breathing, nonlabored ventilation, respiratory function stable and patient connected to nasal cannula oxygen Cardiovascular status: blood pressure returned to baseline and stable Postop Assessment: no apparent nausea or vomiting Anesthetic complications: no   No notable events documented.  Last Vitals:  Vitals:   12/16/20 1300 12/16/20 1315  BP:    Pulse: 80 88  Resp: 15 14  Temp:    SpO2: 98% 99%    Last Pain:  Vitals:   12/16/20 1221  TempSrc:   PainSc: Asleep                 Effie Berkshire

## 2020-12-17 ENCOUNTER — Encounter (HOSPITAL_COMMUNITY): Payer: Self-pay | Admitting: Surgery

## 2020-12-17 LAB — BASIC METABOLIC PANEL
Anion gap: 10 (ref 5–15)
BUN: 9 mg/dL (ref 6–20)
CO2: 23 mmol/L (ref 22–32)
Calcium: 9.4 mg/dL (ref 8.9–10.3)
Chloride: 104 mmol/L (ref 98–111)
Creatinine, Ser: 1.11 mg/dL (ref 0.61–1.24)
GFR, Estimated: >60 mL/min (ref >60)
Glucose, Bld: 106 mg/dL — ABNORMAL HIGH (ref 70–99)
Potassium: 3.8 mmol/L (ref 3.5–5.1)
Sodium: 137 mmol/L (ref 135–145)

## 2020-12-17 LAB — TYPE AND SCREEN
ABO/RH(D): O POS
Antibody Screen: NEGATIVE
Unit division: 0
Unit division: 0

## 2020-12-17 LAB — CBC
HCT: 36.5 % — ABNORMAL LOW (ref 39.0–52.0)
Hemoglobin: 11.6 g/dL — ABNORMAL LOW (ref 13.0–17.0)
MCH: 25.6 pg — ABNORMAL LOW (ref 26.0–34.0)
MCHC: 31.8 g/dL (ref 30.0–36.0)
MCV: 80.4 fL (ref 80.0–100.0)
Platelets: 255 10*3/uL (ref 150–400)
RBC: 4.54 MIL/uL (ref 4.22–5.81)
RDW: 15.9 % — ABNORMAL HIGH (ref 11.5–15.5)
WBC: 14.1 10*3/uL — ABNORMAL HIGH (ref 4.0–10.5)
nRBC: 0 % (ref 0.0–0.2)

## 2020-12-17 LAB — BPAM RBC
Blood Product Expiration Date: 202209162359
Blood Product Expiration Date: 202209172359
ISSUE DATE / TIME: 202208141253
Unit Type and Rh: 5100
Unit Type and Rh: 5100

## 2020-12-17 LAB — MAGNESIUM: Magnesium: 1.6 mg/dL — ABNORMAL LOW (ref 1.7–2.4)

## 2020-12-17 MED ORDER — WHITE PETROLATUM EX OINT
TOPICAL_OINTMENT | CUTANEOUS | Status: AC
  Administered 2020-12-17: 0.2
  Filled 2020-12-17: qty 28.35

## 2020-12-17 MED ORDER — HYDROMORPHONE HCL 1 MG/ML IJ SOLN: 0.5000 mg | INTRAMUSCULAR | Status: DC | PRN

## 2020-12-17 MED ORDER — OXYCODONE HCL 5 MG PO TABS
5.0000 mg | ORAL_TABLET | ORAL | Status: DC | PRN
  Administered 2020-12-17 – 2020-12-18 (×5): 10 mg via ORAL
  Filled 2020-12-17 (×5): qty 2

## 2020-12-17 NOTE — Progress Notes (Addendum)
    1 Day Post-Op  Subjective: No acute issues overnight. Initially hypertensive postop, improved this morning. Pain controlled with PCA. High UOP 100-200 ml/hr. Patient is tolerating clear liquids, no nausea or vomiting. Labs pending.   Objective: Vital signs in last 24 hours: Temp:  [97 F (36.1 C)-99 F (37.2 C)] 97.8 F (36.6 C) (08/16 0400) Pulse Rate:  [80-99] 82 (08/16 0600) Resp:  [13-26] 14 (08/16 0600) BP: (102-149)/(58-82) 128/75 (08/16 0600) SpO2:  [93 %-100 %] 96 % (08/16 0600) Arterial Line BP: (101-190)/(54-108) 155/75 (08/16 0600) Last BM Date:  (pta)  Intake/Output from previous day: 08/15 0701 - 08/16 0700 In: 4094.2 [I.V.:3334.2; IV Piggyback:760] Out: 4910 [Urine:4890; Blood:20] Intake/Output this shift: Total I/O In: 1328 [I.V.:1328] Out: 2030 [Urine:2030]  PE: General: resting comfortably, NAD Neuro: alert and oriented, no focal deficits Resp: normal work of breathing CV: RRR Abdomen: soft, nondistended, appropriately tender to palpation. Midline incision clean and dry with no erythema, induration or drainage.  Extremities: warm and well-perfused GU: foley in place draining clear urine  Lab Results:  Recent Labs    12/14/20 2128 12/16/20 1016  WBC 7.8 18.7*  HGB 12.7* 10.2*  HCT 38.7* 32.3*  PLT 250 208   BMET Recent Labs    12/14/20 2128 12/16/20 1016  NA 139 138  K 3.6 3.5  CL 108 108  CO2 25 23  GLUCOSE 115* 153*  BUN 9 7  CREATININE 1.29* 1.23  CALCIUM 9.4 8.6*   PT/INR Recent Labs    12/14/20 2128  LABPROT 13.5  INR 1.0   CMP     Component Value Date/Time   NA 138 12/16/2020 1016   K 3.5 12/16/2020 1016   CL 108 12/16/2020 1016   CO2 23 12/16/2020 1016   GLUCOSE 153 (H) 12/16/2020 1016   BUN 7 12/16/2020 1016   CREATININE 1.23 12/16/2020 1016   CALCIUM 8.6 (L) 12/16/2020 1016   PROT 7.6 08/25/2020 1145   ALBUMIN 4.4 08/25/2020 1145   AST 28 08/25/2020 1145   ALT 47 (H) 08/25/2020 1145   ALKPHOS 62  08/25/2020 1145   BILITOT 0.7 08/25/2020 1145   GFRNONAA >60 12/16/2020 1016   Lipase     Component Value Date/Time   LIPASE 37 08/25/2020 1145       Studies/Results: No results found.  Anti-infectives: Anti-infectives (From admission, onward)    None        Assessment/Plan  40 yo male with retroperitoneal paraganglioma, POD1 s/p open resection. - Advance to regular diet, decrease IV fluids - Remove foley - Remove central line and a-line - Discontinue PCA, transition to oral oxycodone and IV dilaudid prn - Scheduled tylenol and robaxin - IS, ambulate, PT ordered - Doxazosin discontinued, decrease atenolol to 12.5, plan to discontinue in 1-2 weeks - VTE: lovenox, SCDs - Dispo: transfer to med-surg floor    LOS: 3 days    Michaelle Birks, MD [facility] Surgery General, Hepatobiliary and Pancreatic Surgery 12/17/20 6:44 AM

## 2020-12-17 NOTE — Evaluation (Signed)
Physical Therapy Evaluation Patient Details Name: Kenneth Hoit Sr. MRN: [id number] DOB: Jan 07, 1981 Today's Date: 12/17/2020   History of Present Illness  Pt is a 40 y.o. M who presents with retroperitoneal paraganglioma s/p open resection 12/16/2020. Significant PMH: hip surgery.  Clinical Impression  PTA, pt lives with his family and is independent. On PT evaluation, pt moving exceptionally well for his first time out of bed. Ambulating 250 feet with no assistive device at a supervision level. SpO2 98% on RA, HR peak 90 bpm. Education provided regarding abdominal precautions for comfort and IS use. Will continue to follow acutely to promote mobility. No PT follow up anticipated.     Follow Up Recommendations No PT follow up    Equipment Recommendations  None recommended by PT    Recommendations for Other Services       Precautions / Restrictions Precautions Precautions: None Restrictions Weight Bearing Restrictions: No      Mobility  Bed Mobility Overal bed mobility: Needs Assistance Bed Mobility: Rolling;Sidelying to Sit Rolling: Supervision Sidelying to sit: Min assist       General bed mobility comments: Cues for log roll technique. light minA at trunk to progress to upright    Transfers Overall transfer level: Needs assistance Equipment used: None Transfers: Sit to/from Stand Sit to Stand: Min guard            Ambulation/Gait Ambulation/Gait assistance: Supervision Gait Distance (Feet): 250 Feet Assistive device: IV Pole Gait Pattern/deviations: Step-through pattern;Decreased stride length     General Gait Details: slow and steady pace, use of IV pole for external support  Stairs            Wheelchair Mobility    Modified Rankin (Stroke Patients Only)       Balance Overall balance assessment: Mild deficits observed, not formally tested                                           Pertinent Vitals/Pain Pain  Assessment: Faces Faces Pain Scale: Hurts even more Pain Location: abdomen Pain Descriptors / Indicators: Discomfort;Grimacing;Guarding Pain Intervention(s): Monitored during session;Limited activity within patient's tolerance    Home Living Family/patient expects to be discharged to:: Private residence Living Arrangements: Spouse/significant other;Children (children ages 73-12) Available Help at Discharge: Family Type of Home: House Home Access: Level entry     Home Layout: Two level        Prior Function Level of Independence: Independent         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, newspaper        Extremity/Trunk Assessment   Upper Extremity Assessment Upper Extremity Assessment: Overall WFL for tasks assessed    Lower Extremity Assessment Lower Extremity Assessment: Overall WFL for tasks assessed       Communication   Communication: No difficulties  Cognition Arousal/Alertness: Awake/alert Behavior During Therapy: WFL for tasks assessed/performed Overall Cognitive Status: Within Functional Limits for tasks assessed                                        General Comments      Exercises     Assessment/Plan    PT Assessment Patient needs continued PT services  PT Problem List Decreased strength;Decreased activity tolerance;Decreased mobility;Pain  PT Treatment Interventions Gait training;Stair training;Functional mobility training;Therapeutic activities;Therapeutic exercise;Balance training;Patient/family education    PT Goals (Current goals can be found in the Care Plan section)  Acute Rehab PT Goals Patient Stated Goal: to walk PT Goal Formulation: With patient Time For Goal Achievement: 12/31/20 Potential to Achieve Goals: Good    Frequency Min 3X/week   Barriers to discharge        Co-evaluation               AM-PAC PT "6 Clicks" Mobility  Outcome Measure Help needed turning from your back to your  side while in a flat bed without using bedrails?: None Help needed moving from lying on your back to sitting on the side of a flat bed without using bedrails?: A Little Help needed moving to and from a bed to a chair (including a wheelchair)?: A Little Help needed standing up from a chair using your arms (e.g., wheelchair or bedside chair)?: A Little Help needed to walk in hospital room?: A Little Help needed climbing 3-5 steps with a railing? : A Little 6 Click Score: 19    End of Session   Activity Tolerance: Patient tolerated treatment well Patient left: in chair;with call bell/phone within reach;with family/visitor present Nurse Communication: Mobility status PT Visit Diagnosis: Difficulty in walking, not elsewhere classified (R26.2);Pain Pain - part of body:  (abdomen)    Time: [id number] PT Time Calculation (min) (ACUTE ONLY): 29 min   Charges:   PT Evaluation $PT Eval Low Complexity: 1 Low PT Treatments $Therapeutic Activity: 8-22 mins        Wyona Almas, PT, DPT Acute Rehabilitation Services Pager [phone] Office [phone]   Deno Etienne 12/17/2020, 12:50 PM

## 2020-12-18 MED ORDER — POLYETHYLENE GLYCOL 3350 17 G PO PACK
17.0000 g | PACK | Freq: Every day | ORAL | Status: DC
  Administered 2020-12-18 – 2020-12-19 (×2): 17 g via ORAL
  Filled 2020-12-18 (×2): qty 1

## 2020-12-18 NOTE — Progress Notes (Signed)
    2 Days Post-Op  Subjective: No acute issues. Having some pain but it is manageable. Afebrile, vitals stable. Voiding without difficulty. Ambulating, did very well with PT yesterday. Passing flatus. Tolerating regular diet, no nausea or vomiting.   Objective: Vital signs in last 24 hours: Temp:  [97.9 F (36.6 C)-99 F (37.2 C)] 98.2 F (36.8 C) (08/17 0741) Pulse Rate:  [62-87] 62 (08/17 0741) Resp:  [12-19] 17 (08/17 0741) BP: (113-132)/(67-89) 113/67 (08/17 0741) SpO2:  [97 %-100 %] 100 % (08/17 0741) Last BM Date:  (pta)  Intake/Output from previous day: 08/16 0701 - 08/17 0700 In: 932.9 [P.O.:240; I.V.:692.9] Out: 950 [Urine:950] Intake/Output this shift: No intake/output data recorded.  PE: General: resting comfortably, NAD Neuro: alert and oriented, no focal deficits Resp: normal work of breathing on room air Abdomen: soft, nondistended, appropriately tender to palpation. Midline incision clean and dry with no erythema, induration or drainage.  Extremities: warm and well-perfused   Lab Results:  Recent Labs    12/16/20 1016 12/17/20 0609  WBC 18.7* 14.1*  HGB 10.2* 11.6*  HCT 32.3* 36.5*  PLT 208 255   BMET Recent Labs    12/16/20 1016 12/17/20 0609  NA 138 137  K 3.5 3.8  CL 108 104  CO2 23 23  GLUCOSE 153* 106*  BUN 7 9  CREATININE 1.23 1.11  CALCIUM 8.6* 9.4   PT/INR No results for input(s): LABPROT, INR in the last 72 hours.  CMP     Component Value Date/Time   NA 137 12/17/2020 0609   K 3.8 12/17/2020 0609   CL 104 12/17/2020 0609   CO2 23 12/17/2020 0609   GLUCOSE 106 (H) 12/17/2020 0609   BUN 9 12/17/2020 0609   CREATININE 1.11 12/17/2020 0609   CALCIUM 9.4 12/17/2020 0609   PROT 7.6 08/25/2020 1145   ALBUMIN 4.4 08/25/2020 1145   AST 28 08/25/2020 1145   ALT 47 (H) 08/25/2020 1145   ALKPHOS 62 08/25/2020 1145   BILITOT 0.7 08/25/2020 1145   GFRNONAA >60 12/17/2020 0609   Lipase     Component Value Date/Time   LIPASE  37 08/25/2020 1145       Studies/Results: No results found.  Anti-infectives: Anti-infectives (From admission, onward)    None        Assessment/Plan  40 yo male with retroperitoneal paraganglioma, POD2 s/p open resection. - Continue regular diet, SLIV - Multimodal pain control - Scheduled tylenol and robaxin - IS, ambulate\ - Continue atenolol at 12.'5mg'$  daily - VTE: lovenox, SCDs - Dispo: inpatient. Anticipate discharge home tomorrow.   LOS: 4 days    Michaelle Birks, MD [facility] Surgery General, Hepatobiliary and Pancreatic Surgery 12/18/20 9:05 AM

## 2020-12-18 NOTE — Progress Notes (Signed)
Physical Therapy Treatment Patient Details Name: Kenneth Blackwell. MRN: [id number] DOB: Oct 01, 1980 Today's Date: 12/18/2020    History of Present Illness      PT Comments    Pt. Has reached optimal functional level and is able to amb with IND x 500 ft and negotiate 1 flight of stairs with supervision only.  No further skilled PT is req'd in acute care and pt. Is safe to return home with assist from spouse.   Follow Up Recommendations  No PT follow up     Equipment Recommendations  None recommended by PT    Recommendations for Other Services       Precautions / Restrictions Precautions Precautions: None Restrictions Other Position/Activity Restrictions: Abd incision - log roll for comfort    Mobility  Bed Mobility               General bed mobility comments: Reviewed log roll technique with PT demonstrating activity.  Pt. and spouse demo understanding. Patient Response: Cooperative  Transfers Overall transfer level: Modified independent   Transfers: Sit to/from Stand           General transfer comment: Supervision only for sit > stand.  Pt. uses pillow for abdominal comfort.  Takes inc time to complete activity but demos good balance and safety.  Ambulation/Gait Ambulation/Gait assistance: Supervision Gait Distance (Feet): 500 Feet   Gait Pattern/deviations: Decreased stride length     General Gait Details: Slow cadence, dec arm swing.  Only req's supervision for activity.   Stairs Stairs: Yes Stairs assistance: Min guard Stair Management: One rail Left;Step to pattern Number of Stairs: 14 General stair comments: Pt. negotiated 1 flight of stairs with CGA.  Step to pattern with L LE noted to be dominant leg.  Demos good balance with activity.  1 standing rest break after descending stairs, and one additional standing rest break after ascending stairs.   Wheelchair Mobility    Modified Rankin (Stroke Patients Only)       Balance  Overall balance assessment: Independent                                          Cognition Arousal/Alertness: Awake/alert Behavior During Therapy: WFL for tasks assessed/performed Overall Cognitive Status: Within Functional Limits for tasks assessed                                 General Comments: Agreeable to work with PT      Exercises Other Exercises Other Exercises: Educated on basic LE therex that pt. can do in chair or supine in bed.  Educated not to do any exercises that will increase abd pressure (i.e. no SLR or marching).  Pt. and spouse demo understanding.  Encouraged to always complete therex within a ROM that is comfortable for abdomen.    General Comments        Pertinent Vitals/Pain Pain Assessment: 0-10 Pain Score: 10-Worst pain ever Pain Location: Reports 10/10 abd pain with movement. Pain Descriptors / Indicators: Discomfort Pain Intervention(s): Monitored during session    Home Living                      Prior Function            PT Goals (current goals can now be found in the  care plan section) Progress towards PT goals: Goals met/education completed, patient discharged from PT    Frequency           PT Plan Other (comment) (Pt. has reached optimal functional level, no further skilled PT req'd in acute care.)    Co-evaluation              AM-PAC PT "6 Clicks" Mobility   Outcome Measure  Help needed turning from your back to your side while in a flat bed without using bedrails?: None Help needed moving from lying on your back to sitting on the side of a flat bed without using bedrails?: None Help needed moving to and from a bed to a chair (including a wheelchair)?: None Help needed standing up from a chair using your arms (e.g., wheelchair or bedside chair)?: None Help needed to walk in hospital room?: None Help needed climbing 3-5 steps with a railing? : A Little 6 Click Score: 23    End of  Session Equipment Utilized During Treatment: Gait belt Activity Tolerance: Patient tolerated treatment well Patient left: in chair;with family/visitor present Nurse Communication: Mobility status PT Visit Diagnosis: Difficulty in walking, not elsewhere classified (R26.2);Pain Pain - part of body:  (abdomen)     Time: 0052-5910 PT Time Calculation (min) (ACUTE ONLY): 20 min  Charges:  $Gait Training: 8-22 mins                     Jeran Hiltz A. Welton Bord, PT, DPT Acute Rehabilitation Services Office: [place] 12/18/2020, 1:30 PM

## 2020-12-19 ENCOUNTER — Ambulatory Visit: Payer: Self-pay | Admitting: Surgery

## 2020-12-19 MED ORDER — ACETAMINOPHEN 500 MG PO TABS: 1000.0000 mg | ORAL_TABLET | Freq: Three times a day (TID) | ORAL | 0 refills | Status: DC | PRN

## 2020-12-19 MED ORDER — OXYCODONE HCL 5 MG PO TABS: 5.0000 mg | ORAL_TABLET | ORAL | 0 refills | Status: DC | PRN

## 2020-12-19 MED ORDER — POLYETHYLENE GLYCOL 3350 17 G PO PACK: 17.0000 g | PACK | Freq: Every day | ORAL | 0 refills | Status: DC | PRN

## 2020-12-19 MED ORDER — METHOCARBAMOL 500 MG PO TABS
500.0000 mg | ORAL_TABLET | Freq: Four times a day (QID) | ORAL | 0 refills | Status: DC | PRN
Start: 2020-12-19 — End: 2022-11-11

## 2020-12-19 MED ORDER — DOCUSATE SODIUM 100 MG PO CAPS: 100.0000 mg | ORAL_CAPSULE | Freq: Two times a day (BID) | ORAL | 0 refills | Status: DC

## 2020-12-19 MED ORDER — ATENOLOL 25 MG PO TABS: 12.5000 mg | ORAL_TABLET | Freq: Every day | ORAL | 0 refills | Status: DC

## 2020-12-19 NOTE — Discharge Summary (Signed)
Physician Discharge Summary   Patient ID: Kenneth Blackwell [id number] y.o. 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 Heis is a 40 yo male who presented several months ago with abdominal pain and on imaging workup was incidentally found to have a left retroperitoneal mass consistent with a paraganglioma. Serum metanephrines and catecholamines were elevated, and MIBG scan confirmed hypermetabolic activity of the mass. He began preoperative alpha blockade on 7/25, and several days later began beta blockade.   Hospital Course: The patient was admitted to the hospital on 8/13 for preop hydration and blood pressure management prior to his planned surgery on 8/13. He was continued on doxazosin and atenolol at admission. SBP remained in the 110s-120s and he was given gentle IV fluid hydration. On the morning of 8/15 he was taken to the operating room for an open abdominal exploration and excision of the retroperitoneal mass. For details of this procedure please see separately dictated operative note. Postoperatively the patient was admitted to the ICU for close hemodynamic monitoring. He was given IV fluid hydration and was started on a clear liquid diet. On the morning of POD1 he was hemodynamically stable and had excellent UOP. Foley catheter was removed and he was able to void. He was advanced to a regular diet. He was transferred to a med-surg floor. He was able to ambulate without assistance over the next few days and had return of bowel function. He was transitioned to oral pain medications. On the morning of POD3, he was tolerating a regular diet, passing flatus,  ambulating, and pain was controlled on oral medications. He was examined and deemed appropriate for discharge home.  Consults: None  Significant Diagnostic Studies: None  Treatments: IV hydration, analgesia: acetaminophen, Dilaudid, and oxycodone, therapies: PT, and surgery: open excision of left retroperitoneal mass  Discharge Exam: BP 129/84 (BP Location: Right Arm)   Pulse 68   Temp 98 F (36.7 C) (Oral)   Resp 18   Ht 6' (1.829 m)   Wt 72.2 kg   SpO2 100%   BMI 21.59 kg/m  General: resting comfortably, NAD Neuro: alert and oriented Resp: normal work of breathing on room air CV: extremities warm and well-perfused Abdomen: soft, nondistended, nontender to palpation. Midline incision clean and dry with no erythema, induration or drainage.   Disposition: Discharge disposition: 01-Home or Self Care       Patient Instructions:  Allergies as of 12/19/2020   No Known Allergies      Medication List     STOP taking these medications    doxazosin 4 MG tablet Commonly known as: CARDURA       TAKE these medications    acetaminophen 500 MG tablet Commonly known as: TYLENOL Take 2 tablets (1,000 mg total) by mouth every 8 (eight) hours as needed for mild pain.   atenolol 25 MG tablet Commonly known as: TENORMIN Take 0.5 tablets (12.5 mg total) by mouth daily for 5 days. What changed:  how much to take when to take this   docusate sodium 100 MG capsule Commonly known as: COLACE Take 1 capsule (100 mg total) by mouth 2 (two) times daily.   hydrOXYzine 25 MG tablet Commonly known as: ATARAX/VISTARIL Take 1 tablet (25  mg total) by mouth every 6 (six) hours.   methocarbamol 500 MG tablet Commonly known as: ROBAXIN Take 1 tablet (500 mg total) by mouth every 6 (six) hours as needed for muscle spasms.   omeprazole 40 MG capsule Commonly known as: PRILOSEC Take 1 capsule (40 mg total) by mouth daily.   ondansetron 4 MG disintegrating tablet Commonly known  as: ZOFRAN-ODT Take 1 tablet (4 mg total) by mouth every 6 (six) hours as needed for nausea.   oxyCODONE 5 MG immediate release tablet Commonly known as: Oxy IR/ROXICODONE Take 1 tablet (5 mg total) by mouth every 4 (four) hours as needed for severe pain.   polyethylene glycol 17 g packet Commonly known as: MIRALAX / GLYCOLAX Take 17 g by mouth daily as needed (constipation).       Activity: ambulate in house, no driving while on analgesics, and no heavy lifting for 6 weeks Diet: regular diet Wound Care: keep wound clean and dry  Follow-up with Dr. Zenia Resides on 01/08/21.  Signed: Dwan Bolt 12/19/2020 1:43 PM

## 2020-12-19 NOTE — Discharge Instructions (Addendum)
[facility] DISCHARGE INSTRUCTIONS  Activity No heavy lifting greater than 10 pounds for 6 weeks after surgery. Ok to shower, but do not bathe or submerge incision underwater. Do not drive while taking narcotic pain medication. Be sure to walk around your home at least 3 times daily. This will help avoid blood clots and will improve your strength.  Wound Care Your incision is covered with skin glue called Dermabond. This will peel off on its own over time. You may shower and allow warm soapy water to run over your incisions. Gently pat dry. Do not submerge your incision underwater. Monitor your incision for any new redness, tenderness, or drainage.  Medications You may take Tylenol up to 4 times daily for pain. Do not take any more than '4000mg'$  Tylenol in a 24-hour period. If you have more severe pain that is not covered by Tylenol, you may take oxycodone up to every 4 hours as needed. Please reserve this medication only for the most severe pain. Take a stool softener (colace) twice daily to prevent constipation. You may also take Miralax daily as needed for constipation.  Diet You may resume a regular diet as tolerated. If you are having frequent vomiting after meals or are unable to keep down any food or drink, please call the office right away.  When to Call us: Fever greater than 100.5 New redness, drainage, or swelling at incision site Severe pain, nausea, or vomiting Excessive vomiting or inability to keep down any liquids Jaundice (yellowing of the whites of the eyes or skin)  Follow-up You have an appointment scheduled with Dr. Zenia Resides on January 08, 2021 at 10:30am. This will be at the [facility] Surgery office at [address] [place], [place], [place]. Please arrive at least 15 minutes prior to your scheduled appointment time.  For questions or concerns, please call the office at [phone].

## 2020-12-19 NOTE — Progress Notes (Signed)
    3 Days Post-Op  Subjective: No acute changes. Pain continues to improve. BP and HR within normal limits. Passing flatus, no bowel movement yet. Tolerating regular diet. Ambulating without difficulty.   Objective: Vital signs in last 24 hours: Temp:  [98.2 F (36.8 C)-98.5 F (36.9 C)] 98.5 F (36.9 C) (08/18 0442) Pulse Rate:  [62-74] 69 (08/18 0442) Resp:  [17-18] 17 (08/18 0442) BP: (113-122)/(67-83) 122/83 (08/18 0442) SpO2:  [100 %] 100 % (08/18 0442) Last BM Date:  (pta)  Intake/Output from previous day: 08/17 0701 - 08/18 0700 In: 480 [P.O.:480] Out: -  Intake/Output this shift: No intake/output data recorded.  PE: General: resting comfortably, NAD Neuro: alert and oriented, no focal deficits Resp: normal work of breathing on room air Abdomen: soft, nondistended, appropriately tender to palpation. Midline incision clean and dry with no erythema, induration or drainage.  Extremities: warm and well-perfused   Lab Results:  Recent Labs    12/16/20 1016 12/17/20 0609  WBC 18.7* 14.1*  HGB 10.2* 11.6*  HCT 32.3* 36.5*  PLT 208 255   BMET Recent Labs    12/16/20 1016 12/17/20 0609  NA 138 137  K 3.5 3.8  CL 108 104  CO2 23 23  GLUCOSE 153* 106*  BUN 7 9  CREATININE 1.23 1.11  CALCIUM 8.6* 9.4   PT/INR No results for input(s): LABPROT, INR in the last 72 hours.  CMP     Component Value Date/Time   NA 137 12/17/2020 0609   K 3.8 12/17/2020 0609   CL 104 12/17/2020 0609   CO2 23 12/17/2020 0609   GLUCOSE 106 (H) 12/17/2020 0609   BUN 9 12/17/2020 0609   CREATININE 1.11 12/17/2020 0609   CALCIUM 9.4 12/17/2020 0609   PROT 7.6 08/25/2020 1145   ALBUMIN 4.4 08/25/2020 1145   AST 28 08/25/2020 1145   ALT 47 (H) 08/25/2020 1145   ALKPHOS 62 08/25/2020 1145   BILITOT 0.7 08/25/2020 1145   GFRNONAA >60 12/17/2020 0609   Lipase     Component Value Date/Time   LIPASE 37 08/25/2020 1145       Studies/Results: No results  found.  Anti-infectives: Anti-infectives (From admission, onward)    None        Assessment/Plan  40 yo male with retroperitoneal paraganglioma, POD3 s/p open resection. - Continue regular diet - Multimodal pain control - IS, ambulate - Continue atenolol at 12.'5mg'$  daily for 5 more days, then discontinue - VTE: lovenox, SCDs - Dispo: inpatient. Possible discharge later today vs tomorrow morning.   LOS: 5 days    Michaelle Birks, MD [facility] Surgery General, Hepatobiliary and Pancreatic Surgery 12/19/20 7:11 AM

## 2020-12-24 LAB — SURGICAL PATHOLOGY

## 2020-12-27 ENCOUNTER — Other Ambulatory Visit: Payer: Self-pay

## 2020-12-27 ENCOUNTER — Ambulatory Visit: Payer: BC Managed Care – PPO | Admitting: Internal Medicine

## 2020-12-27 ENCOUNTER — Encounter: Payer: Self-pay | Admitting: Internal Medicine

## 2020-12-27 VITALS — BP 130/78 | HR 73 | Ht 72.0 in | Wt 162.0 lb

## 2020-12-27 DIAGNOSIS — D447 Neoplasm of uncertain behavior of aortic body and other paraganglia: Secondary | ICD-10-CM | POA: Diagnosis not present

## 2020-12-27 DIAGNOSIS — R1900 Intra-abdominal and pelvic swelling, mass and lump, unspecified site: Secondary | ICD-10-CM

## 2020-12-27 DIAGNOSIS — I10 Essential (primary) hypertension: Secondary | ICD-10-CM

## 2020-12-27 DIAGNOSIS — D446 Neoplasm of uncertain behavior of carotid body: Secondary | ICD-10-CM

## 2020-12-27 NOTE — Progress Notes (Signed)
Patient ID: Tommi Rumps Sr., male   DOB: March 08, 1981, 40 y.o.   MRN: [id number]   This visit occurred during the SARS-CoV-2 public health emergency.  Safety protocols were in place, including screening questions prior to the visit, additional usage of staff PPE, and extensive cleaning of exam room while observing appropriate contact time as indicated for disinfecting solutions.   Patient ID: Tommi Rumps Sr., male   DOB: Jun 29, 1980, 40 y.o.   MRN: [id number]   HPI  Davaughn Hillyard Sr. is a 40 y.o.-year-old male, initially referred by Dr. 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  Last visit 3 months ago.  Interim hx: He recently had abdominal paraganglioma surgery 11 days ago.  He feels well after the surgery except from some abdominal discomfort at the surgical scar.  He came the beta-blocker after the surgery. He continues to have back pain with sciatica. He had PT, nerve blocks.  No HAs, palpitations, significant weight changes.   Reviewed and addended history: Patient presented to the ED on 08/25/2020 for abdominal pain.  At that time, he was found to have mild appendicitis and treated with antibiotics.  However, during work-up for this, he had a CT scan showing: 2.9 cm enhancing left retroperitoneal mass, in proximity to the distal duodenum. Primary considerations include neurogenic tumor, extra-adrenal pheochromocytoma, less likely gastrointestinal stromal tumor or neuroendocrine tumor:   Upon questioning, patient has a family history of: - pericarotid paraganglioma (unresectable) and pheochromocytoma (hormonally active) in a brother (33 years old) - he was in a study at [place] and had genetic testing >> father is carrier - neck paraganglioma (silent) in another brother 175 years old) - HTN in father (71 years old) - Pancreatic cancer in maternal grandmother (diagnosed in her  early 43s)  Patient was seen by surgery please contemplating resection.  However, due to his family history, he was referred for genetic counseling.  He had this appointment on 09/09/2020. Blood was sent for a mutation panel containing 84 genes including: MEN1, RET, VHL, SDHx, MAX, TMEM127, PRKAR1A, Tp53, MLH and [place].  The results were positive for SDH D mutation.  At last visit, his catecholamines and metanephrines were elevated: Component     Latest Ref Rng & Units 09/24/2020  Epinephrine     pg/mL 46  Norepinephrine     pg/mL 1,810 (H)  Dopamine     pg/mL 69 (H)  Total Catecholamines     pg/mL 1,925 (H)  Metanephrine, Pl     <=57 pg/mL 40  Normetanephrine, Pl     <=148 pg/mL 634 (H)  Total Metanephrines-Plasma     <=205 pg/mL 674 (H)   I referred him for a PET scan to screen for other catecholamine producing tumors (10/16/2020):  Mediastinal blood pool activity: SUV max 2.1   HEAD/NECK: Bilateral hypermetabolic lesions are identified in the deep neck bilaterally, in the region of the carotid bifurcation although vascular anatomy not well demonstrated on noncontrast CT imaging. Lesion on the left measures approximately 1.9 x 1.8 cm, but again, is not well demonstrated given lack of intravenous contrast. This lesion demonstrates SUV max = 20.8. The lesion on the right measures approximately 2.0 x 1.3 cm with SUV max = 13.2.   Incidental CT findings: none   CHEST: No hypermetabolic mediastinal or hilar nodes. No suspicious pulmonary nodules on the CT scan.   Incidental CT findings: none   ABDOMEN/PELVIS: No abnormal hypermetabolic  activity within the liver, pancreas, adrenal glands, or spleen. No hypermetabolic lymph nodes in the abdomen or pelvis.The 2.9 cm left retroperitoneal lesion described on previous CT scan is markedly hypermetabolic with SUV max = 37.4.   Incidental CT findings: none   SKELETON: No focal hypermetabolic activity to suggest skeletal metastasis.    Incidental CT findings: Air-fluid levels noted in the maxillary sinuses bilaterally with opacification of scattered ethmoid air cells. Status post pin placement right femoral neck.   EXTREMITIES: No abnormal hypermetabolic activity in the lower extremities.   Incidental CT findings: none   IMPRESSION: 1. Bilateral hypermetabolic soft tissue lesions in the neck, in the region of each carotid bifurcation. Given the reported family history, bilateral glomus tumor would be a distinct consideration. Neck CT with contrast recommended to further evaluate. 2. Known left retroperitoneal lesion is markedly hypermetabolic. 3. No evidence for hypermetabolic disease in the chest or pelvis. No unexpected hypermetabolism in the bony anatomy or lower extremities.  CT scan (10/28/2020): Vascular: Prominently enhancing mass lesions are seen within the carotid sheath bilaterally at the level of the carotid bifurcation with splaying of the ECA-ICA on each side, measuring approximately 2.7 x 1.6 x 1.6 cm on the right and 3 x 2.2 x 1.9 cm on the left. Findings are more consistent with bilateral carotid body tumors.   Limited intracranial: Negative.   Visualized orbits: Negative.   Mastoids and visualized paranasal sinuses: Fluid level within the bilateral maxillary sinuses and mucosal thickening in the bilateral ethmoid cells. Correlate clinically for acute sinusitis.   Skeleton: No acute or aggressive process.   Upper chest: Paraseptal emphysema on the left.   Other: None.   IMPRESSION: 1. Bilateral carotid bifurcation tumors measuring up to 2.7 cm on the right and 3 cm on the left, most consistent with carotid body tumors. 2. Inflammatory paranasal sinus disease. Correlate clinically for acute sinusitis.  We decided to just follow the carotid body tumors at that time but he had surgery for retroperitoneal mass resection (12/16/2020): A. SOFT TISSUE MASS, RETROPERITONEAL, EXCISION:  -  Extra-adrenal paraganglioma, see comment  The lesional cells are positive for synaptophysin, chromogranin and  vimentin with only scattered cells staining for S100.  Cells are  negative for AE1/AE3, Melan-A, HMB45, desmin, CD31, SMA, D2-40, AFP,  glypican-3 and GATA3.  Immunostain for Ki-67 shows a low proliferative  index.  This immunoprofile is consistent with above interpretation.   He will have an appointment with vascular surgery in few days.  Patient's medical history includes: -Lumbar back pain with radiculopathy -Right hip fracture in 2007 s/p ORIF -Sinusitis -Bilateral varicocele and hydrocele  He does not have a clear history of hypertension (previous blood pressures were checked in the setting of pain): -Hypertension or hypertensive spells -Palpitations -Increased sweating -Headaches -Increased anxiety  Reviewed previous blood pressure levels: BP Readings from Last 3 Encounters:  12/19/20 129/84  12/04/20 114/72  11/27/20 125/89   He is not on antihypertensive medications.    Chemistry      Component Value Date/Time   NA 137 12/17/2020 0609   K 3.8 12/17/2020 0609   CL 104 12/17/2020 0609   CO2 23 12/17/2020 0609   BUN 9 12/17/2020 0609   CREATININE 1.11 12/17/2020 0609      Component Value Date/Time   CALCIUM 9.4 12/17/2020 0609   ALKPHOS 62 08/25/2020 1145   AST 28 08/25/2020 1145   ALT 47 (H) 08/25/2020 1145   BILITOT 0.7 08/25/2020 1145     No  history of hypokalemia: Lab Results  Component Value Date   K 3.8 12/17/2020   K 3.5 12/16/2020   K 3.6 12/14/2020   K 4.1 12/03/2020   K 3.8 11/27/2020   K 4.6 08/26/2020   K 4.2 08/25/2020   No known history of hypo or hyperthyroidism.  TSH levels were normal: No results found for: TSH   No h/o diabetes: 2 12/21/2008: HbA1c 5.8% No results found for: HGBA1C   No known hyperparathyroidism or hypercalcemia: No results found for: PTH  Lab Results  Component Value Date   CALCIUM 9.4 12/17/2020    CALCIUM 8.6 (L) 12/16/2020   CALCIUM 9.4 12/14/2020   CALCIUM 9.9 12/03/2020   CALCIUM 9.7 11/27/2020   CALCIUM 9.4 08/26/2020   CALCIUM 9.6 08/25/2020   Pt. denies use of stimulants.   Denies street drug use.   Not on steroids now.  He had a spinal block earlier in the year. He stopped smoking -gained some weight afterwards. Previously on gabapentin for back pain due to herniated disc, but stopped.  ROS: Constitutional: no weight gain, no fatigue, no subjective hyperthermia/hypothermia Eyes: no blurry vision, no xerophthalmia ENT: no sore throat, no nodules palpated in throat, no dysphagia/odynophagia, no hoarseness Cardiovascular: no CP/SOB/palpitations/leg swelling Respiratory: no cough/SOB Gastrointestinal: no N/V/D/C Musculoskeletal: + muscle/joint aches Skin: no rashes Neurological: no tremors/numbness/tingling/dizziness   Past Medical History:  Diagnosis Date   Family history of pancreatic cancer 09/09/2020   Family history of pheochromocytoma 09/09/2020   Lumbar herniated disc    Monoallelic mutation of SDHD gene 10/01/2020   Past Surgical History:  Procedure Laterality Date   HIP SURGERY     RESECTION OF RETROPERITONEAL MASS N/A 12/16/2020   Procedure: EXCISION OF RETROPERITONEAL MASS PHEOCHROMOCYTOMA;  Surgeon: Dwan Bolt, MD;  Location: [facility] OR;  Service: General;  Laterality: N/A;   Social History   Socioeconomic History   Marital status: Married    Spouse name: Not on file   Number of children: 4   Years of education: Not on file   Highest education level: Not on file  Occupational History   Occupation: Delivery driver for [employer]  Tobacco Use   Smoking status: Former    Types: Cigarettes    Quit date: 08/2020    Years since quitting: 0.4   Smokeless tobacco: Never  Vaping Use   Vaping Use: Former  Substance and Sexual Activity   Alcohol use: Yes    Comment: occassionally- "once every 2 weeks"   Drug use: No   Sexual activity: Yes     Comment: has taken hydrocodone a few days ago  Other Topics Concern   Not on file  Social History Narrative   Not on file   Social Determinants of Health   Financial Resource Strain: Not on file  Food Insecurity: Not on file  Transportation Needs: Not on file  Physical Activity: Not on file  Stress: Not on file  Social Connections: Not on file  Intimate Partner Violence: Not on file   Current Outpatient Medications on File Prior to Visit  Medication Sig Dispense Refill   acetaminophen (TYLENOL) 500 MG tablet Take 2 tablets (1,000 mg total) by mouth every 8 (eight) hours as needed for mild pain.  0   atenolol (TENORMIN) 25 MG tablet Take 0.5 tablets (12.5 mg total) by mouth daily for 5 days. 3 tablet 0   docusate sodium (COLACE) 100 MG capsule Take 1 capsule (100 mg total) by mouth 2 (two) times daily. [place]  capsule 0   hydrOXYzine (ATARAX/VISTARIL) 25 MG tablet Take 1 tablet (25 mg total) by mouth every 6 (six) hours. (Patient not taking: Reported on 12/03/2020) 12 tablet 0   methocarbamol (ROBAXIN) 500 MG tablet Take 1 tablet (500 mg total) by mouth every 6 (six) hours as needed for muscle spasms. 20 tablet 0   omeprazole (PRILOSEC) 40 MG capsule Take 1 capsule (40 mg total) by mouth daily. (Patient not taking: Reported on 12/03/2020) 30 capsule 1   ondansetron (ZOFRAN-ODT) 4 MG disintegrating tablet Take 1 tablet (4 mg total) by mouth every 6 (six) hours as needed for nausea. (Patient not taking: Reported on 12/03/2020) 20 tablet 0   oxyCODONE (OXY IR/ROXICODONE) 5 MG immediate release tablet Take 1 tablet (5 mg total) by mouth every 4 (four) hours as needed for severe pain. 30 tablet 0   polyethylene glycol (MIRALAX / GLYCOLAX) 17 g packet Take 17 g by mouth daily as needed (constipation). 14 each 0   No current facility-administered medications on file prior to visit.   No Known Allergies Family History  Problem Relation Age of Onset   Pancreatic cancer Maternal Grandmother        dx  early 23s   Other Half-Brother        paternal; paraganglioma and pheochromocytoma; SDHD mutation    Other Half-Brother        paternal; ? paraganglioma   Hypertension Father    Prostate cancer Paternal Grandfather        d. 58s   PE: BP 130/78 (BP Location: Right Arm, Patient Position: Sitting, Cuff Size: Normal)   Pulse 73   Ht 6' (1.829 m)   Wt 162 lb (73.5 kg)   SpO2 98%   BMI 21.97 kg/m  Wt Readings from Last 3 Encounters:  12/27/20 162 lb (73.5 kg)  12/15/20 159 lb 2.8 oz (72.2 kg)  11/27/20 163 lb 6.4 oz (74.1 kg)   Constitutional: Normal weight, in NAD Eyes: PERRLA, EOMI ENT: moist mucous membranes, no thyromegaly,  no cervical lymphadenopathy Cardiovascular: RRR, No MRG Respiratory: CTA B Gastrointestinal: abdomen soft, NT, ND, BS+ Musculoskeletal: no deformities, strength intact in all 4 Skin: moist, warm, no rashes Neurological: no tremor with outstretched hands, DTR normal in all 4  ASSESSMENT: 1.  Retroperitoneal paraganglioma  2.  Carotid body tumor (paraganglioma  - Family history of pheochromocytoma and paraganglioma  - Positive for SDHD mutation  3.  Elevated blood pressure  PLAN: 1. Retroperitoneal paraganglioma -In 08/2020, patient was incidentally found to have a left retroperitoneal mass measuring 2.9 cm during investigation for appendicitis. -Upon questioning, he had family history of paraganglioma in 2 of his brothers and pheochromocytoma in one of his brothers.  He also had hypertension in father.  Patient himself had stage 0-I HTN. -He was referred to genetic counseling: He had a a panel of gene mutations (for Li-Fraumeni syndrome, Lynch syndrome, Carney complex, MEN 1, MEN 2, VHL, SDH mutations, etc.).  Results were positive for SDHD mutation.  He was referred to endocrinology and surgery. -At our first visit 3 months ago, his norepinephrine and normetanephrine were very elevated, confirming the suspicion for a catecholamine-producing  tumor -We performed a PET scan and this showed increased activity in the left retroperitoneal mass and also bilateral carotids -He had resection of the retroperitoneal mass 12/16/2020 and pathology showed a low Ki-67 index -He returns for follow-up after his surgery -At today's visit, he feels well, without complaints -We will repeat his catecholamines and metanephrines  today  2.  Carotid body tumor (paraganglioma) -Bilateral, discovered on PET scan in the setting of investigation for SDH D mutation and retroperitoneal paraganglioma -As of now, he is asymptomatic, however, we discussed that these tumors can grow and cause local compression symptoms along with hypertensive episodes -He will eventually need to have these resected, but we can delay the surgery for now, if the growth rate is low -For now, my suggestion would be to check a head and neck MRI at at our next visit and, depending on the results, to proceed with surgery or follow-up suggested.  However, it appears that he already has an appointment with vascular surgery (which is great) and I did advise him that they may have a different plan for him.  -I will see him back in 6 months  3. HTN -Patient with elevated blood pressure/stage I hypertension -today systolic blood pressure slightly higher than before, at 130/70 -I do not feel he needs medication for now but I did advise him to start checking his blood pressure at home.  He does have a blood pressure cuff and his wife is a paramedic.  Component     Latest Ref Rng & Units 09/24/2020 (Preop) 12/27/2020 (11 days postop)  Epinephrine     pg/mL 46 108 (H)  Norepinephrine     pg/mL 1,810 (H) 488  Dopamine     pg/mL 69 (H) 39 (H)  Total Catecholamines     pg/mL 1,925 (H) 635  Metanephrine, Pl     <=57 pg/mL 40 74 (H)  Normetanephrine, Pl     <=148 pg/mL 634 (H) 148  Total Metanephrines-Plasma     <=205 pg/mL 674 (H) 222 (H)  Epinephrine and metanephrine slightly higher than  before, possibly in relationship to recent surgery.  Otherwise, significant improvement in norepinephrine and normetanephrine.  We will recheck at next visit.  Philemon Kingdom, MD PhD [facility]

## 2020-12-27 NOTE — Patient Instructions (Signed)
Please stop at the lab.  Please come back for a follow-up appointment in 6 months.  

## 2020-12-30 ENCOUNTER — Other Ambulatory Visit: Payer: Self-pay

## 2020-12-30 ENCOUNTER — Ambulatory Visit: Payer: BC Managed Care – PPO | Admitting: Surgery

## 2020-12-30 ENCOUNTER — Encounter: Payer: Self-pay | Admitting: Surgery

## 2020-12-30 VITALS — BP 119/80 | HR 73 | Temp 97.5°F | Resp 16 | Ht 72.0 in | Wt 160.0 lb

## 2020-12-30 DIAGNOSIS — D446 Neoplasm of uncertain behavior of carotid body: Secondary | ICD-10-CM | POA: Diagnosis not present

## 2020-12-30 NOTE — Progress Notes (Signed)
[facility] [place]  Patient name: Kenneth Carelock Sr. MRN: [id number] DOB: 09/30/1980 Sex: male   REQUESTING PROVIDER:    Dr. Jilda Panda   REASON FOR CONSULT:    Bilateral carotid body tumors  HISTORY OF PRESENT ILLNESS:   Kenneth Mollner Sr. is a 40 y.o. 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  He is recovering well from this.  There is a significant family history of pheochromocytoma and paraganglioma.  One of his brothers had 2 carotid body tumors and an adrenal tumor addressed.  The other had an adrenal tumor resected.  His family has had genetic testing at [facility].  His father is a carrier.  His testing revealed positivity for SDH D.  As part of his work-up he had carotid CT scan which showed bilateral lesions that were hypermetabolic on PET scan  Prior to his surgical resection he was on alpha blockade and beta-blockade, which have been discontinued.  He carries the diagnosis of hypertension, but is not currently on medications.  He is a former smoker.  PAST MEDICAL HISTORY    Past Medical History:  Diagnosis Date   Family history of pancreatic cancer 09/09/2020   Family history of pheochromocytoma 09/09/2020   Lumbar herniated disc    Monoallelic mutation of SDHD gene 10/01/2020     FAMILY HISTORY   Family History  Problem Relation Age of Onset   Hypertension Father    Pancreatic cancer Maternal Grandmother        dx early 22s   Prostate cancer Paternal Grandfather        d. 74s   Other Half-Brother        paternal; paraganglioma and pheochromocytoma; SDHD mutation    Other Half-Brother        paternal; ? paraganglioma    SOCIAL HISTORY:   Social History   Socioeconomic History   Marital status: Married    Spouse name: Not on file   Number of children: 4   Years of education: Not on file   Highest  education level: Not on file  Occupational History   Occupation: Delivery driver for [employer]  Tobacco Use   Smoking status: Former    Types: Cigarettes    Quit date: 08/2020    Years since quitting: 0.4   Smokeless tobacco: Never  Vaping Use   Vaping Use: Former  Substance and Sexual Activity   Alcohol use: Yes    Comment: occassionally- "once every 2 weeks"   Drug use: No   Sexual activity: Yes    Comment: has taken hydrocodone a few days ago  Other Topics Concern   Not on file  Social History Narrative   Not on file   Social Determinants of Health   Financial Resource Strain: Not on file  Food Insecurity: Not on file  Transportation Needs: Not on file  Physical Activity: Not on file  Stress: Not on file  Social Connections: Not on file  Intimate Partner Violence: Not on file    ALLERGIES:    No Known Allergies  CURRENT MEDICATIONS:    Current Outpatient Medications  Medication Sig Dispense Refill   docusate sodium (COLACE) 100 MG capsule Take 1 capsule (100 mg total) by mouth 2 (two) times daily. 60 capsule 0   methocarbamol (ROBAXIN) 500 MG tablet Take 1 tablet (500 mg total) by mouth every 6 (six) hours as needed for muscle spasms.  20 tablet 0   oxyCODONE (OXY IR/ROXICODONE) 5 MG immediate release tablet Take 1 tablet (5 mg total) by mouth every 4 (four) hours as needed for severe pain. 30 tablet 0   acetaminophen (TYLENOL) 500 MG tablet Take 2 tablets (1,000 mg total) by mouth every 8 (eight) hours as needed for mild pain. (Patient not taking: No sig reported)  0   atenolol (TENORMIN) 25 MG tablet Take 0.5 tablets (12.5 mg total) by mouth daily for 5 days. 3 tablet 0   hydrOXYzine (ATARAX/VISTARIL) 25 MG tablet Take 1 tablet (25 mg total) by mouth every 6 (six) hours. (Patient not taking: No sig reported) 12 tablet 0   omeprazole (PRILOSEC) 40 MG capsule Take 1 capsule (40 mg total) by mouth daily. (Patient not taking: No sig reported) 30 capsule 1    ondansetron (ZOFRAN-ODT) 4 MG disintegrating tablet Take 1 tablet (4 mg total) by mouth every 6 (six) hours as needed for nausea. (Patient not taking: No sig reported) 20 tablet 0   polyethylene glycol (MIRALAX / GLYCOLAX) 17 g packet Take 17 g by mouth daily as needed (constipation). (Patient not taking: No sig reported) 14 each 0   No current facility-administered medications for this visit.    REVIEW OF SYSTEMS:   '[X]'$  denotes positive finding, '[ ]'$  denotes negative finding Cardiac  Comments:  Chest pain or chest pressure:    Shortness of breath upon exertion:    Short of breath when lying flat:    Irregular heart rhythm:        Vascular    Pain in calf, thigh, or hip brought on by ambulation:    Pain in feet at night that wakes you up from your sleep:     Blood clot in your veins:    Leg swelling:         Pulmonary    Oxygen at home:    Productive cough:     Wheezing:         Neurologic    Sudden weakness in arms or legs:     Sudden numbness in arms or legs:     Sudden onset of difficulty speaking or slurred speech:    Temporary loss of vision in one eye:     Problems with dizziness:         Gastrointestinal    Blood in stool:      Vomited blood:         Genitourinary    Burning when urinating:     Blood in urine:        Psychiatric    Major depression:         Hematologic    Bleeding problems:    Problems with blood clotting too easily:        Skin    Rashes or ulcers:        Constitutional    Fever or chills:     PHYSICAL EXAM:   Vitals:   12/30/20 0913 12/30/20 0915  BP: 114/79 119/80  Pulse: 70 73  Resp: 16   Temp: (!) 97.5 F (36.4 C)   TempSrc: Temporal   SpO2: 98%   Weight: 160 lb (72.6 kg)   Height: 6' (1.829 m)     GENERAL: The patient is a well-nourished male, in no acute distress. The vital signs are documented above. CARDIAC: There is a regular rate and rhythm.  VASCULAR: Palpable pedal pulses.  Carotid lesions are easily  palpable PULMONARY: Nonlabored respirations ABDOMEN:  Soft and non-tender midline incision healing MUSCULOSKELETAL: There are no major deformities or cyanosis. NEUROLOGIC: No focal weakness or paresthesias are detected. SKIN: There are no ulcers or rashes noted. PSYCHIATRIC: The patient has a normal affect.  STUDIES:   I have reviewed his neck CT scan with the following findings: 1. Bilateral carotid bifurcation tumors measuring up to 2.7 cm on the right and 3 cm on the left, most consistent with carotid body tumors. 2. Inflammatory paranasal sinus disease. Correlate clinically for acute sinusitis. ASSESSMENT and PLAN   Bilateral carotid body tumor, left greater than right: The patient has recently had a paraganglioma removed off of the left adrenal gland.  He has 2 what appear to be carotid body tumors.  I discussed resecting these in a staged fashion.  I will do this in conjunction with ENT.  I have sent a referral to them.  I am also getting a CT angiogram to better define his arterial anatomy.  I would like for the patient to have preoperative embolization.  We will try to coordinate this within the next month as the patient would like to get it done before the end of September as he is going to see the [facility] in [place] in October.  We discussed complications of surgery including nerve injury, bleeding, possibility of arterial repair.  He understands this and wishes to proceed.   Leia Alf, MD, FACS [facility] [phone] Pager [phone]

## 2020-12-31 ENCOUNTER — Ambulatory Visit (HOSPITAL_COMMUNITY)
Admission: RE | Admit: 2020-12-31 | Discharge: 2020-12-31 | Disposition: A | Discharge status: Home / Self Care | Payer: BC Managed Care – PPO | Source: Ambulatory Visit | Attending: Surgery | Admitting: Surgery

## 2020-12-31 DIAGNOSIS — D446 Neoplasm of uncertain behavior of carotid body: Secondary | ICD-10-CM | POA: Insufficient documentation

## 2020-12-31 MED ORDER — IOHEXOL 350 MG/ML SOLN
75.0000 mL | Freq: Once | INTRAVENOUS | Status: AC | PRN
  Administered 2020-12-31: 75 mL via INTRAVENOUS

## 2021-01-01 LAB — CATECHOLAMINES, FRACTIONATED, PLASMA
Dopamine: 39 pg/mL — ABNORMAL HIGH
Epinephrine: 108 pg/mL — ABNORMAL HIGH
Norepinephrine: 488 pg/mL
Total Catecholamines: 635 pg/mL

## 2021-01-01 LAB — METANEPHRINES, PLASMA
Metanephrine, Free: 74 pg/mL — ABNORMAL HIGH (ref <=57)
Normetanephrine, Free: 148 pg/mL (ref <=148)
Total Metanephrines-Plasma: 222 pg/mL — ABNORMAL HIGH (ref <=205)

## 2021-01-02 ENCOUNTER — Other Ambulatory Visit: Payer: Self-pay

## 2021-01-02 ENCOUNTER — Telehealth: Payer: Self-pay | Admitting: Genetic Counselor

## 2021-01-02 DIAGNOSIS — D446 Neoplasm of uncertain behavior of carotid body: Secondary | ICD-10-CM

## 2021-01-02 DIAGNOSIS — R918 Other nonspecific abnormal finding of lung field: Secondary | ICD-10-CM

## 2021-01-02 NOTE — Telephone Encounter (Signed)
Mr. Neary called to ask about scheduling his children for genetic testing.  He has been unable to reach [facility] schedule at [facility].  Message sent to [facility] at [facility] to ask about most appropriate way for Mr. Blomberg children to get on schedule.  Mr. Boles also asked about genetics near [place], [place], where is 39 year old daughter lives.  Will follow up tomorrow with information about genetics facilities near that area.

## 2021-01-03 ENCOUNTER — Other Ambulatory Visit: Payer: Self-pay

## 2021-01-03 ENCOUNTER — Ambulatory Visit (HOSPITAL_COMMUNITY)
Admission: RE | Admit: 2021-01-03 | Discharge: 2021-01-03 | Disposition: A | Discharge status: Home / Self Care | Payer: BC Managed Care – PPO | Source: Ambulatory Visit | Attending: Surgery | Admitting: Surgery

## 2021-01-03 DIAGNOSIS — D446 Neoplasm of uncertain behavior of carotid body: Secondary | ICD-10-CM | POA: Diagnosis not present

## 2021-01-03 DIAGNOSIS — R918 Other nonspecific abnormal finding of lung field: Secondary | ICD-10-CM | POA: Diagnosis present

## 2021-01-03 MED ORDER — IOHEXOL 350 MG/ML SOLN
80.0000 mL | Freq: Once | INTRAVENOUS | Status: AC | PRN
  Administered 2021-01-03: 80 mL via INTRAVENOUS

## 2021-01-15 ENCOUNTER — Telehealth: Payer: Self-pay

## 2021-01-15 NOTE — Telephone Encounter (Signed)
Spoke with pt to discuss surgery scheduling process- per ENT/Dr. Rosen's office pt no showed for appointment on 01/07/21. Pt verbalized that he was not contacted with appointment information. Pt agreed to reach out to office to reschedule and contact me back with new office visit date.

## 2021-01-20 DIAGNOSIS — Q998 Other specified chromosome abnormalities: Secondary | ICD-10-CM | POA: Insufficient documentation

## 2021-01-23 ENCOUNTER — Other Ambulatory Visit: Payer: Self-pay

## 2021-02-04 ENCOUNTER — Other Ambulatory Visit (HOSPITAL_COMMUNITY): Payer: Self-pay | Admitting: Interventional Radiology

## 2021-02-04 DIAGNOSIS — D446 Neoplasm of uncertain behavior of carotid body: Secondary | ICD-10-CM

## 2021-02-06 ENCOUNTER — Other Ambulatory Visit: Payer: Self-pay

## 2021-02-06 DIAGNOSIS — D446 Neoplasm of uncertain behavior of carotid body: Secondary | ICD-10-CM

## 2021-02-13 ENCOUNTER — Other Ambulatory Visit: Payer: Self-pay | Admitting: Radiology

## 2021-02-13 NOTE — Progress Notes (Signed)
Surgical Instructions    Your procedure is scheduled on 02/21/21.  Report to [facility] Main Entrance "A" at 05:30 A.M., then check in with the Admitting office.  Call this number if you have problems the morning of surgery:  [phone]   If you have any questions prior to your surgery date call [phone]: Open Monday-Friday 8am-4pm    Remember:  Do not eat or drink after midnight the night before your surgery      Take these medicines the morning of surgery with A SIP OF WATER: NONE   As of today, STOP taking any Aspirin (unless otherwise instructed by your surgeon) Aleve, Naproxen, Ibuprofen, Motrin, Advil, Goody's, BC's, all herbal medications, fish oil, and all vitamins.     After your COVID test   You are not required to quarantine however you are required to wear a well-fitting mask when you are out and around people not in your household.  If your mask becomes wet or soiled, replace with a new one.  Wash your hands often with soap and water for 20 seconds or clean your hands with an alcohol-based hand sanitizer that contains at least 60% alcohol.  Do not share personal items.  Notify your provider: if you are in close contact with someone who has COVID  or if you develop a fever of 100.4 or greater, sneezing, cough, sore throat, shortness of breath or body aches.             Do not wear jewelry or makeup Do not wear lotions, powders, perfumes/colognes, or deodorant. Men may shave face and neck. Do not bring valuables to the hospital. DO Not wear nail polish, gel polish, artificial nails, or any other type of covering on natural nails including finger and toenails. If patients have artificial nails, gel coating, etc. that need to be removed by a nail salon, please have this removed prior to surgery or surgery may need to be canceled/delayed if the surgeon/ anesthesia feels like the patient is unable to be adequately monitored.             [facility] is not  responsible for any belongings or valuables.  Do NOT Smoke (Tobacco/Vaping)  24 hours prior to your procedure  If you use a CPAP at night, you may bring your mask for your overnight stay.   Contacts, glasses, hearing aids, dentures or partials may not be worn into surgery, please bring cases for these belongings   For patients admitted to the hospital, discharge time will be determined by your treatment team.   Patients discharged the day of surgery will not be allowed to drive home, and someone needs to stay with them for 24 hours.  NO VISITORS WILL BE ALLOWED IN PRE-OP WHERE PATIENTS ARE PREPPED FOR SURGERY.  ONLY 1 SUPPORT PERSON MAY BE PRESENT IN THE WAITING ROOM WHILE YOU ARE IN SURGERY.  IF YOU ARE TO BE ADMITTED, ONCE YOU ARE IN YOUR ROOM YOU WILL BE ALLOWED TWO (2) VISITORS. 1 (ONE) VISITOR MAY STAY OVERNIGHT BUT MUST ARRIVE TO THE ROOM BY 8pm.  Minor children may have two parents present. Special consideration for safety and communication needs will be reviewed on a case by case basis.  Special instructions:    Oral Hygiene is also important to reduce your risk of infection.  Remember - BRUSH YOUR TEETH THE MORNING OF SURGERY WITH YOUR REGULAR TOOTHPASTE   [facility]- Preparing For Surgery  Before surgery, you can play an important role.  Because skin is not sterile, your skin needs to be as free of germs as possible. You can reduce the number of germs on your skin by washing with CHG (chlorahexidine gluconate) Soap before surgery.  CHG is an antiseptic cleaner which kills germs and bonds with the skin to continue killing germs even after washing.     Please do not use if you have an allergy to CHG or antibacterial soaps. If your skin becomes reddened/irritated stop using the CHG.  Do not shave (including legs and underarms) for at least 48 hours prior to first CHG shower. It is OK to shave your face.  Please follow these instructions carefully.     Shower the NIGHT BEFORE  SURGERY and the MORNING OF SURGERY with CHG Soap.   If you chose to wash your hair, wash your hair first as usual with your normal shampoo. After you shampoo, rinse your hair and body thoroughly to remove the shampoo.  Then [employer] and genitals (private parts) with your normal soap and rinse thoroughly to remove soap.  After that Use CHG Soap as you would any other liquid soap. You can apply CHG directly to the skin and wash gently with a scrungie or a clean washcloth.   Apply the CHG Soap to your body ONLY FROM THE NECK DOWN.  Do not use on open wounds or open sores. Avoid contact with your eyes, ears, mouth and genitals (private parts). Wash Face and genitals (private parts)  with your normal soap.   Wash thoroughly, paying special attention to the area where your surgery will be performed.  Thoroughly rinse your body with warm water from the neck down.  DO NOT shower/wash with your normal soap after using and rinsing off the CHG Soap.  Pat yourself dry with a CLEAN TOWEL.  Wear CLEAN PAJAMAS to bed the night before surgery  Place CLEAN SHEETS on your bed the night before your surgery  DO NOT SLEEP WITH PETS.   Day of Surgery: Take a shower with CHG soap. Wear Clean/Comfortable clothing the morning of surgery Do not apply any deodorants/lotions.   Remember to brush your teeth WITH YOUR REGULAR TOOTHPASTE.   Please read over the following fact sheets that you were given.

## 2021-02-14 ENCOUNTER — Encounter (HOSPITAL_COMMUNITY): Payer: Self-pay

## 2021-02-14 ENCOUNTER — Other Ambulatory Visit: Payer: Self-pay

## 2021-02-14 ENCOUNTER — Encounter (HOSPITAL_COMMUNITY)
Admission: RE | Admit: 2021-02-14 | Discharge: 2021-02-14 | Disposition: A | Discharge status: Home / Self Care | Payer: BC Managed Care – PPO | Source: Ambulatory Visit | Attending: Interventional Radiology | Admitting: Interventional Radiology

## 2021-02-14 DIAGNOSIS — Z01812 Encounter for preprocedural laboratory examination: Secondary | ICD-10-CM | POA: Diagnosis not present

## 2021-02-14 LAB — COMPREHENSIVE METABOLIC PANEL
ALT: 25 U/L (ref 0–44)
AST: 19 U/L (ref 15–41)
Albumin: 3.8 g/dL (ref 3.5–5.0)
Alkaline Phosphatase: 70 U/L (ref 38–126)
Anion gap: 6 (ref 5–15)
BUN: 6 mg/dL (ref 6–20)
CO2: 26 mmol/L (ref 22–32)
Calcium: 9.3 mg/dL (ref 8.9–10.3)
Chloride: 105 mmol/L (ref 98–111)
Creatinine, Ser: 1.25 mg/dL — ABNORMAL HIGH (ref 0.61–1.24)
GFR, Estimated: >60 mL/min (ref >60)
Glucose, Bld: 90 mg/dL (ref 70–99)
Potassium: 3.7 mmol/L (ref 3.5–5.1)
Sodium: 137 mmol/L (ref 135–145)
Total Bilirubin: 0.7 mg/dL (ref 0.3–1.2)
Total Protein: 7 g/dL (ref 6.5–8.1)

## 2021-02-14 LAB — TYPE AND SCREEN
ABO/RH(D): O POS
Antibody Screen: NEGATIVE

## 2021-02-14 LAB — CBC
HCT: 41.8 % (ref 39.0–52.0)
Hemoglobin: 13 g/dL (ref 13.0–17.0)
MCH: 25.2 pg — ABNORMAL LOW (ref 26.0–34.0)
MCHC: 31.1 g/dL (ref 30.0–36.0)
MCV: 81.2 fL (ref 80.0–100.0)
Platelets: 289 10*3/uL (ref 150–400)
RBC: 5.15 MIL/uL (ref 4.22–5.81)
RDW: 16.2 % — ABNORMAL HIGH (ref 11.5–15.5)
WBC: 7.5 10*3/uL (ref 4.0–10.5)
nRBC: 0 % (ref 0.0–0.2)

## 2021-02-14 LAB — SURGICAL PCR SCREEN
MRSA, PCR: NEGATIVE
Staphylococcus aureus: NEGATIVE

## 2021-02-14 LAB — URINALYSIS, ROUTINE W REFLEX MICROSCOPIC
Bilirubin Urine: NEGATIVE
Glucose, UA: NEGATIVE mg/dL
Hgb urine dipstick: NEGATIVE
Ketones, ur: NEGATIVE mg/dL
Leukocytes,Ua: NEGATIVE
Nitrite: NEGATIVE
Protein, ur: NEGATIVE mg/dL
Specific Gravity, Urine: 1.009 (ref 1.005–1.030)
pH: 7 (ref 5.0–8.0)

## 2021-02-14 LAB — PROTIME-INR
INR: 1.1 (ref 0.8–1.2)
Prothrombin Time: 13.9 seconds (ref 11.4–15.2)

## 2021-02-14 LAB — APTT: aPTT: 31 seconds (ref 24–36)

## 2021-02-14 NOTE — Progress Notes (Signed)
PCP - Jillyn Ledger, FNP Cardiologist - denies  PPM/ICD - denies   Chest x-ray - 07/27/17 EKG - 08/26/20 Stress Test - denies ECHO - denies Cardiac Cath - denies  Sleep Study - denies   DM- denies  Blood Thinner Instructions: n/a Aspirin Instructions: n/a  ERAS Protcol - No, NPO   COVID TEST- pt scheduled for testing on 02/18/21   Anesthesia review: No  Patient denies shortness of breath, fever, cough and chest pain at PAT appointment   All instructions explained to the patient, with a verbal understanding of the material. Patient agrees to go over the instructions while at home for a better understanding. Patient also instructed to wear a mask in public after being tested for COVID-19. The opportunity to ask questions was provided.

## 2021-02-14 NOTE — Progress Notes (Signed)
Pt seen in PAT today for procedure with Dr. Trula Slade on 10/21. Pt placed on SDW list for procedure with Dr. Estanislado Pandy on 10/19. Pt was called and instructed to arrive at 0630 on 10/19.  Per pt, covid test will be done on 10/18, pt informed this will also be adequate for procedure on 10/19. Pt instructed to follow all other pre-op instructions he was given today for the 10/19 procedure as well. Pt instructed to follow any additional instructions that may be given to him in the next few days by Dr. Estanislado Pandy or his office. Pt verbalized understanding of this. No further questions. Pt aware to call [phone] if he has any questions between today and day of his procedure 10/19 and 10/21.

## 2021-02-17 NOTE — H&P (Signed)
HPI:   Kenneth Blackwell is a 40 y.o. male who presents as a new Patient.   Referring Provider: Pcp, Verify Patient Has  Chief complaint: Paragangliomas.  HPI: History of hereditary paraganglioma. He had a resection of an abdominal mass recently. He was found to have bilateral carotid body tumors and seen by Dr. Trula Slade vascular surgeon. He is here for consideration of joint surgical procedure. He is fairly healthy otherwise, does not smoke but he does have a bad back problem. The abdominal mass was identified when he went to the emergency department due to abdominal pain of an unrelated source. He was found to be secreting and had evaluation by endocrinology as well.  PMH/Meds/All/SocHx/FamHx/ROS:   Past Medical History:  Diagnosis Date   Chronic back pain   Past Surgical History:  Procedure Laterality Date   ABDOMINAL SURGERY   HIP SURGERY  2007   No family history of bleeding disorders, wound healing problems or difficulty with anesthesia.   Social History   Socioeconomic History   Marital status: Single  Spouse name: Not on file   Number of children: Not on file   Years of education: Not on file   Highest education level: Not on file  Occupational History   Not on file  Tobacco Use   Smoking status: Former Smoker  Quit date: 07/02/2020  Years since quitting: 0.5   Smokeless tobacco: Never Used  Vaping Use   Vaping Use: Never used  Substance and Sexual Activity   Alcohol use: Not on file   Drug use: Not on file   Sexual activity: Not on file  Other Topics Concern   Not on file  Social History Narrative   Not on file   Social Determinants of Health   Financial Resource Strain: Not on file  Food Insecurity: Not on file  Transportation Needs: Not on file  Physical Activity: Not on file  Stress: Not on file  Social Connections: Not on file  Housing Stability: Not on file   No current outpatient medications on file.  A complete ROS was performed with pertinent  positives/negatives noted in the HPI. The remainder of the ROS are negative.   Physical Exam:   BP 134/77  Pulse 80  Temp 98.3 F (36.8 C)  Ht 1.829 m (6')  Wt 74.4 kg (164 lb)  BMI 22.24 kg/m   General: Healthy and alert, in no distress, breathing easily. Normal affect. In a pleasant mood. Head: Normocephalic, atraumatic. No masses, or scars. Eyes: Pupils are equal, and reactive to light. Vision is grossly intact. No spontaneous or gaze nystagmus. Ears: Ear canals are clear. Tympanic membranes are intact, with normal landmarks and the middle ears are clear and healthy. Hearing: Grossly normal. Nose: Nasal cavities are clear with healthy mucosa, no polyps or exudate. Airways are patent. Face: No masses or scars, facial nerve function is symmetric. Oral Cavity: No mucosal abnormalities are noted. Tongue with normal mobility. Dentition appears healthy. Oropharynx: Tonsils are symmetric. There are no mucosal masses identified. Tongue base appears normal and healthy. Larynx/Hypopharynx: indirect exam reveals healthy, mobile vocal cords, without mucosal lesions in the hypopharynx or larynx. Chest: Deferred Neck: Bilateral level 2 neck masses at the level of the bifurcation, no thyroid nodules or enlargement. Neuro: Cranial nerves II-XII with normal function. Balance: Normal gate. Other findings: none.  Independent Review of Additional Tests or Records:  none  Procedures:  none  Impression & Plans:  Bilateral carotid body tumors in the presence of hereditary paraganglioma.  Agree with the need for surgical resection. We will coordinate with Dr. Stephens Shire office. I would recommend 2 separate procedures for each side. Risks and benefits were discussed in detail. We will coordinate.

## 2021-02-17 NOTE — H&P (View-Only) (Signed)
HPI:   Kenneth Blackwell is a 40 y.o. male who presents as a new Patient.   Referring Provider: Pcp, Verify Patient Has  Chief complaint: Paragangliomas.  HPI: History of hereditary paraganglioma. He had a resection of an abdominal mass recently. He was found to have bilateral carotid body tumors and seen by Dr. Trula Slade vascular surgeon. He is here for consideration of joint surgical procedure. He is fairly healthy otherwise, does not smoke but he does have a bad back problem. The abdominal mass was identified when he went to the emergency department due to abdominal pain of an unrelated source. He was found to be secreting and had evaluation by endocrinology as well.  PMH/Meds/All/SocHx/FamHx/ROS:   Past Medical History:  Diagnosis Date   Chronic back pain   Past Surgical History:  Procedure Laterality Date   ABDOMINAL SURGERY   HIP SURGERY  2007   No family history of bleeding disorders, wound healing problems or difficulty with anesthesia.   Social History   Socioeconomic History   Marital status: Single  Spouse name: Not on file   Number of children: Not on file   Years of education: Not on file   Highest education level: Not on file  Occupational History   Not on file  Tobacco Use   Smoking status: Former Smoker  Quit date: 07/02/2020  Years since quitting: 0.5   Smokeless tobacco: Never Used  Vaping Use   Vaping Use: Never used  Substance and Sexual Activity   Alcohol use: Not on file   Drug use: Not on file   Sexual activity: Not on file  Other Topics Concern   Not on file  Social History Narrative   Not on file   Social Determinants of Health   Financial Resource Strain: Not on file  Food Insecurity: Not on file  Transportation Needs: Not on file  Physical Activity: Not on file  Stress: Not on file  Social Connections: Not on file  Housing Stability: Not on file   No current outpatient medications on file.  A complete ROS was performed with pertinent  positives/negatives noted in the HPI. The remainder of the ROS are negative.   Physical Exam:   BP 134/77  Pulse 80  Temp 98.3 F (36.8 C)  Ht 1.829 m (6')  Wt 74.4 kg (164 lb)  BMI 22.24 kg/m   General: Healthy and alert, in no distress, breathing easily. Normal affect. In a pleasant mood. Head: Normocephalic, atraumatic. No masses, or scars. Eyes: Pupils are equal, and reactive to light. Vision is grossly intact. No spontaneous or gaze nystagmus. Ears: Ear canals are clear. Tympanic membranes are intact, with normal landmarks and the middle ears are clear and healthy. Hearing: Grossly normal. Nose: Nasal cavities are clear with healthy mucosa, no polyps or exudate. Airways are patent. Face: No masses or scars, facial nerve function is symmetric. Oral Cavity: No mucosal abnormalities are noted. Tongue with normal mobility. Dentition appears healthy. Oropharynx: Tonsils are symmetric. There are no mucosal masses identified. Tongue base appears normal and healthy. Larynx/Hypopharynx: indirect exam reveals healthy, mobile vocal cords, without mucosal lesions in the hypopharynx or larynx. Chest: Deferred Neck: Bilateral level 2 neck masses at the level of the bifurcation, no thyroid nodules or enlargement. Neuro: Cranial nerves II-XII with normal function. Balance: Normal gate. Other findings: none.  Independent Review of Additional Tests or Records:  none  Procedures:  none  Impression & Plans:  Bilateral carotid body tumors in the presence of hereditary paraganglioma.  Agree with the need for surgical resection. We will coordinate with Dr. Stephens Shire office. I would recommend 2 separate procedures for each side. Risks and benefits were discussed in detail. We will coordinate.

## 2021-02-18 ENCOUNTER — Other Ambulatory Visit (HOSPITAL_COMMUNITY)
Admission: RE | Admit: 2021-02-18 | Discharge: 2021-02-18 | Disposition: A | Discharge status: Home / Self Care | Payer: BC Managed Care – PPO | Source: Ambulatory Visit | Attending: Surgery | Admitting: Surgery

## 2021-02-18 ENCOUNTER — Other Ambulatory Visit: Payer: Self-pay | Admitting: Radiology

## 2021-02-18 DIAGNOSIS — Z20822 Contact with and (suspected) exposure to covid-19: Secondary | ICD-10-CM | POA: Insufficient documentation

## 2021-02-18 DIAGNOSIS — Z01812 Encounter for preprocedural laboratory examination: Secondary | ICD-10-CM | POA: Insufficient documentation

## 2021-02-18 LAB — SARS CORONAVIRUS 2 (TAT 6-24 HRS): SARS Coronavirus 2: NEGATIVE

## 2021-02-19 ENCOUNTER — Encounter (HOSPITAL_COMMUNITY)
Admission: AD | Disposition: A | Discharge status: Home / Self Care | Payer: Self-pay | Source: Ambulatory Visit | Attending: Surgery

## 2021-02-19 ENCOUNTER — Other Ambulatory Visit: Payer: Self-pay

## 2021-02-19 ENCOUNTER — Ambulatory Visit (HOSPITAL_COMMUNITY)
Admission: RE | Admit: 2021-02-19 | Discharge: 2021-02-19 | Disposition: A | Discharge status: Home / Self Care | Payer: BC Managed Care – PPO | Source: Ambulatory Visit | Attending: Interventional Radiology | Admitting: Interventional Radiology

## 2021-02-19 ENCOUNTER — Encounter (HOSPITAL_COMMUNITY): Payer: Self-pay | Admitting: Interventional Radiology

## 2021-02-19 ENCOUNTER — Ambulatory Visit (HOSPITAL_COMMUNITY): Payer: BC Managed Care – PPO | Admitting: General Practice

## 2021-02-19 ENCOUNTER — Inpatient Hospital Stay (HOSPITAL_COMMUNITY)
Admission: AD | Admit: 2021-02-19 | Discharge: 2021-02-22 | DRG: 039 | Disposition: A | Discharge status: Home / Self Care | Payer: BC Managed Care – PPO | Source: Ambulatory Visit | Attending: Surgery | Admitting: Surgery

## 2021-02-19 DIAGNOSIS — Z9889 Other specified postprocedural states: Secondary | ICD-10-CM | POA: Diagnosis not present

## 2021-02-19 DIAGNOSIS — Z79899 Other long term (current) drug therapy: Secondary | ICD-10-CM | POA: Diagnosis not present

## 2021-02-19 DIAGNOSIS — D446 Neoplasm of uncertain behavior of carotid body: Secondary | ICD-10-CM

## 2021-02-19 DIAGNOSIS — Z809 Family history of malignant neoplasm, unspecified: Secondary | ICD-10-CM | POA: Diagnosis not present

## 2021-02-19 DIAGNOSIS — Z8546 Personal history of malignant neoplasm of prostate: Secondary | ICD-10-CM | POA: Diagnosis not present

## 2021-02-19 DIAGNOSIS — I10 Essential (primary) hypertension: Secondary | ICD-10-CM | POA: Diagnosis present

## 2021-02-19 DIAGNOSIS — Z8349 Family history of other endocrine, nutritional and metabolic diseases: Secondary | ICD-10-CM | POA: Diagnosis not present

## 2021-02-19 DIAGNOSIS — Z20822 Contact with and (suspected) exposure to covid-19: Secondary | ICD-10-CM | POA: Diagnosis present

## 2021-02-19 DIAGNOSIS — G8929 Other chronic pain: Secondary | ICD-10-CM | POA: Diagnosis present

## 2021-02-19 DIAGNOSIS — Z87891 Personal history of nicotine dependence: Secondary | ICD-10-CM | POA: Diagnosis not present

## 2021-02-19 DIAGNOSIS — Z8249 Family history of ischemic heart disease and other diseases of the circulatory system: Secondary | ICD-10-CM

## 2021-02-19 HISTORY — PX: IR TRANSCATH/EMBOLIZ: IMG695

## 2021-02-19 HISTORY — PX: RADIOLOGY WITH ANESTHESIA: SHX6223

## 2021-02-19 HISTORY — PX: IR ANGIOGRAM FOLLOW UP STUDY: IMG697

## 2021-02-19 HISTORY — PX: IR ANGIO INTRA EXTRACRAN SEL COM CAROTID INNOMINATE BILAT MOD SED: IMG5360

## 2021-02-19 LAB — CBC
HCT: 38.8 % — ABNORMAL LOW (ref 39.0–52.0)
Hemoglobin: 12.3 g/dL — ABNORMAL LOW (ref 13.0–17.0)
MCH: 25.6 pg — ABNORMAL LOW (ref 26.0–34.0)
MCHC: 31.7 g/dL (ref 30.0–36.0)
MCV: 80.8 fL (ref 80.0–100.0)
Platelets: 310 10*3/uL (ref 150–400)
RBC: 4.8 MIL/uL (ref 4.22–5.81)
RDW: 16.6 % — ABNORMAL HIGH (ref 11.5–15.5)
WBC: 12.5 10*3/uL — ABNORMAL HIGH (ref 4.0–10.5)
nRBC: 0 % (ref 0.0–0.2)

## 2021-02-19 LAB — BASIC METABOLIC PANEL
Anion gap: 5 (ref 5–15)
BUN: 9 mg/dL (ref 6–20)
CO2: 22 mmol/L (ref 22–32)
Calcium: 9.1 mg/dL (ref 8.9–10.3)
Chloride: 110 mmol/L (ref 98–111)
Creatinine, Ser: 1.1 mg/dL (ref 0.61–1.24)
GFR, Estimated: >60 mL/min (ref >60)
Glucose, Bld: 160 mg/dL — ABNORMAL HIGH (ref 70–99)
Potassium: 4 mmol/L (ref 3.5–5.1)
Sodium: 137 mmol/L (ref 135–145)

## 2021-02-19 SURGERY — IR WITH ANESTHESIA
Anesthesia: General

## 2021-02-19 MED ORDER — ONDANSETRON HCL 4 MG/2ML IJ SOLN: 4.0000 mg | Freq: Four times a day (QID) | INTRAMUSCULAR | Status: DC | PRN

## 2021-02-19 MED ORDER — LACTATED RINGERS IV SOLN
INTRAVENOUS | Status: DC
Start: 2021-02-19 — End: 2021-02-19

## 2021-02-19 MED ORDER — PROMETHAZINE HCL 25 MG/ML IJ SOLN: 6.2500 mg | INTRAMUSCULAR | Status: DC | PRN

## 2021-02-19 MED ORDER — CLEVIDIPINE BUTYRATE 0.5 MG/ML IV EMUL
INTRAVENOUS | Status: DC | PRN
  Administered 2021-02-19: 2.5 mg/h via INTRAVENOUS

## 2021-02-19 MED ORDER — ACETAMINOPHEN 650 MG RE SUPP: 650.0000 mg | RECTAL | Status: DC | PRN

## 2021-02-19 MED ORDER — POTASSIUM CHLORIDE CRYS ER 20 MEQ PO TBCR: 20.0000 meq | EXTENDED_RELEASE_TABLET | Freq: Once | ORAL | Status: DC

## 2021-02-19 MED ORDER — CHLORHEXIDINE GLUCONATE 0.12 % MT SOLN
15.0000 mL | Freq: Once | OROMUCOSAL | Status: AC
  Administered 2021-02-19: 15 mL via OROMUCOSAL
  Filled 2021-02-19: qty 15

## 2021-02-19 MED ORDER — CLEVIDIPINE BUTYRATE 0.5 MG/ML IV EMUL
0.0000 mg/h | INTRAVENOUS | Status: AC
  Administered 2021-02-19 (×2): 12 mg/h via INTRAVENOUS
  Administered 2021-02-19: 10 mg/h via INTRAVENOUS
  Administered 2021-02-20 (×2): 8 mg/h via INTRAVENOUS
  Filled 2021-02-19 (×5): qty 50

## 2021-02-19 MED ORDER — ALUM & MAG HYDROXIDE-SIMETH 200-200-20 MG/5ML PO SUSP: 15.0000 mL | ORAL | Status: DC | PRN

## 2021-02-19 MED ORDER — ACETAMINOPHEN 325 MG PO TABS: 325.0000 mg | ORAL_TABLET | Freq: Once | ORAL | Status: DC | PRN

## 2021-02-19 MED ORDER — BISACODYL 10 MG RE SUPP: 10.0000 mg | Freq: Every day | RECTAL | Status: DC | PRN

## 2021-02-19 MED ORDER — FENTANYL CITRATE (PF) 250 MCG/5ML IJ SOLN
INTRAMUSCULAR | Status: DC | PRN
  Administered 2021-02-19: 100 ug via INTRAVENOUS
  Administered 2021-02-19: 50 ug via INTRAVENOUS

## 2021-02-19 MED ORDER — IOHEXOL 350 MG/ML SOLN
100.0000 mL | Freq: Once | INTRAVENOUS | Status: AC | PRN
  Administered 2021-02-19: 75 mL via INTRA_ARTERIAL

## 2021-02-19 MED ORDER — FENTANYL CITRATE (PF) 250 MCG/5ML IJ SOLN
INTRAMUSCULAR | Status: AC
  Filled 2021-02-19: qty 5

## 2021-02-19 MED ORDER — FENTANYL CITRATE (PF) 100 MCG/2ML IJ SOLN: 25.0000 ug | INTRAMUSCULAR | Status: DC | PRN

## 2021-02-19 MED ORDER — DEXAMETHASONE SODIUM PHOSPHATE 10 MG/ML IJ SOLN
INTRAMUSCULAR | Status: DC | PRN
  Administered 2021-02-19: 10 mg via INTRAVENOUS

## 2021-02-19 MED ORDER — HEPARIN SODIUM (PORCINE) 1000 UNIT/ML IJ SOLN
INTRAMUSCULAR | Status: DC | PRN
  Administered 2021-02-19: 3000 [IU] via INTRAVENOUS

## 2021-02-19 MED ORDER — ACETAMINOPHEN 325 MG RE SUPP: 325.0000 mg | RECTAL | Status: DC | PRN

## 2021-02-19 MED ORDER — LACTATED RINGERS IV SOLN: INTRAVENOUS | Status: DC

## 2021-02-19 MED ORDER — METOPROLOL TARTRATE 5 MG/5ML IV SOLN: 2.0000 mg | INTRAVENOUS | Status: DC | PRN

## 2021-02-19 MED ORDER — MEPERIDINE HCL 25 MG/ML IJ SOLN: 6.2500 mg | INTRAMUSCULAR | Status: DC | PRN

## 2021-02-19 MED ORDER — ROCURONIUM BROMIDE 10 MG/ML (PF) SYRINGE
PREFILLED_SYRINGE | INTRAVENOUS | Status: DC | PRN
  Administered 2021-02-19: 20 mg via INTRAVENOUS
  Administered 2021-02-19: 10 mg via INTRAVENOUS
  Administered 2021-02-19: 20 mg via INTRAVENOUS
  Administered 2021-02-19: 50 mg via INTRAVENOUS

## 2021-02-19 MED ORDER — SODIUM CHLORIDE 0.9% FLUSH: 3.0000 mL | INTRAVENOUS | Status: DC | PRN

## 2021-02-19 MED ORDER — PANTOPRAZOLE SODIUM 40 MG PO TBEC
40.0000 mg | DELAYED_RELEASE_TABLET | Freq: Every day | ORAL | Status: DC
  Administered 2021-02-19 – 2021-02-20 (×2): 40 mg via ORAL
  Filled 2021-02-19 (×2): qty 1

## 2021-02-19 MED ORDER — ACETAMINOPHEN 160 MG/5ML PO SOLN: 325.0000 mg | Freq: Once | ORAL | Status: DC | PRN

## 2021-02-19 MED ORDER — LIDOCAINE 2% (20 MG/ML) 5 ML SYRINGE
INTRAMUSCULAR | Status: DC | PRN
  Administered 2021-02-19: 40 mg via INTRAVENOUS

## 2021-02-19 MED ORDER — LABETALOL HCL 5 MG/ML IV SOLN: 10.0000 mg | INTRAVENOUS | Status: DC | PRN

## 2021-02-19 MED ORDER — ACETAMINOPHEN 160 MG/5ML PO SOLN: 650.0000 mg | ORAL | Status: DC | PRN

## 2021-02-19 MED ORDER — SODIUM CHLORIDE 0.9 % IV SOLN: INTRAVENOUS | Status: DC

## 2021-02-19 MED ORDER — ESMOLOL HCL 100 MG/10ML IV SOLN
INTRAVENOUS | Status: DC | PRN
  Administered 2021-02-19 (×2): 20 mg via INTRAVENOUS

## 2021-02-19 MED ORDER — GUAIFENESIN-DM 100-10 MG/5ML PO SYRP: 15.0000 mL | ORAL_SOLUTION | ORAL | Status: DC | PRN

## 2021-02-19 MED ORDER — ORAL CARE MOUTH RINSE: 15.0000 mL | Freq: Once | OROMUCOSAL | Status: AC

## 2021-02-19 MED ORDER — CHLORHEXIDINE GLUCONATE CLOTH 2 % EX PADS
6.0000 | MEDICATED_PAD | Freq: Every day | CUTANEOUS | Status: DC
  Administered 2021-02-19 – 2021-02-22 (×4): 6 via TOPICAL

## 2021-02-19 MED ORDER — PHENYLEPHRINE HCL-NACL 20-0.9 MG/250ML-% IV SOLN
INTRAVENOUS | Status: DC | PRN
Start: 2021-02-19 — End: 2021-02-19
  Administered 2021-02-19: 40 ug/min via INTRAVENOUS

## 2021-02-19 MED ORDER — ONDANSETRON HCL 4 MG/2ML IJ SOLN
INTRAMUSCULAR | Status: DC | PRN
  Administered 2021-02-19: 4 mg via INTRAVENOUS

## 2021-02-19 MED ORDER — IOHEXOL 350 MG/ML SOLN
100.0000 mL | Freq: Once | INTRAVENOUS | Status: AC | PRN
  Administered 2021-02-19: 50 mL via INTRA_ARTERIAL

## 2021-02-19 MED ORDER — ACETAMINOPHEN 325 MG PO TABS: 325.0000 mg | ORAL_TABLET | ORAL | Status: DC | PRN

## 2021-02-19 MED ORDER — PHENOL 1.4 % MT LIQD: 1.0000 | OROMUCOSAL | Status: DC | PRN

## 2021-02-19 MED ORDER — PROTAMINE SULFATE 10 MG/ML IV SOLN
INTRAVENOUS | Status: DC | PRN
  Administered 2021-02-19: 5 mg via INTRAVENOUS
  Administered 2021-02-19: 2.5 mg via INTRAVENOUS

## 2021-02-19 MED ORDER — SODIUM CHLORIDE 0.9 % IV SOLN: 250.0000 mL | INTRAVENOUS | Status: DC | PRN

## 2021-02-19 MED ORDER — CEFAZOLIN SODIUM-DEXTROSE 2-3 GM-%(50ML) IV SOLR
INTRAVENOUS | Status: DC | PRN
  Administered 2021-02-19: 2 g via INTRAVENOUS

## 2021-02-19 MED ORDER — CLEVIDIPINE BUTYRATE 0.5 MG/ML IV EMUL
INTRAVENOUS | Status: AC
  Administered 2021-02-19: 14 mg/h via INTRAVENOUS
  Filled 2021-02-19: qty 50

## 2021-02-19 MED ORDER — SUGAMMADEX SODIUM 200 MG/2ML IV SOLN
INTRAVENOUS | Status: DC | PRN
  Administered 2021-02-19: 200 mg via INTRAVENOUS

## 2021-02-19 MED ORDER — POLYETHYLENE GLYCOL 3350 17 G PO PACK
17.0000 g | PACK | Freq: Every day | ORAL | Status: DC | PRN
Start: 2021-02-19 — End: 2021-02-22

## 2021-02-19 MED ORDER — HYDRALAZINE HCL 20 MG/ML IJ SOLN
5.0000 mg | INTRAMUSCULAR | Status: DC | PRN
Start: 2021-02-19 — End: 2021-02-21

## 2021-02-19 MED ORDER — CLEVIDIPINE BUTYRATE 0.5 MG/ML IV EMUL
INTRAVENOUS | Status: AC
  Filled 2021-02-19: qty 50

## 2021-02-19 MED ORDER — ACETAMINOPHEN 325 MG PO TABS
650.0000 mg | ORAL_TABLET | ORAL | Status: DC | PRN
  Administered 2021-02-19 (×2): 650 mg via ORAL
  Filled 2021-02-19 (×2): qty 2

## 2021-02-19 MED ORDER — SODIUM CHLORIDE 0.9% FLUSH
3.0000 mL | Freq: Two times a day (BID) | INTRAVENOUS | Status: DC
  Administered 2021-02-19 – 2021-02-22 (×6): 3 mL via INTRAVENOUS

## 2021-02-19 MED ORDER — PROPOFOL 10 MG/ML IV BOLUS
INTRAVENOUS | Status: DC | PRN
  Administered 2021-02-19: 170 mg via INTRAVENOUS
  Administered 2021-02-19: 30 mg via INTRAVENOUS
  Administered 2021-02-19: 50 mg via INTRAVENOUS
  Administered 2021-02-19: 40 mg via INTRAVENOUS

## 2021-02-19 MED ORDER — ACETAMINOPHEN 10 MG/ML IV SOLN: 1000.0000 mg | Freq: Once | INTRAVENOUS | Status: DC | PRN

## 2021-02-19 MED ORDER — LACTATED RINGERS IV SOLN: INTRAVENOUS | Status: DC | PRN

## 2021-02-19 MED ORDER — CEFAZOLIN SODIUM-DEXTROSE 2-4 GM/100ML-% IV SOLN
INTRAVENOUS | Status: AC
  Filled 2021-02-19: qty 100

## 2021-02-19 MED ORDER — GLYCOPYRROLATE PF 0.2 MG/ML IJ SOSY
PREFILLED_SYRINGE | INTRAMUSCULAR | Status: DC | PRN
  Administered 2021-02-19 (×2): .1 mg via INTRAVENOUS

## 2021-02-19 MED ORDER — HEPARIN SODIUM (PORCINE) 5000 UNIT/ML IJ SOLN
5000.0000 [IU] | Freq: Three times a day (TID) | INTRAMUSCULAR | Status: DC
  Administered 2021-02-19 – 2021-02-21 (×6): 5000 [IU] via SUBCUTANEOUS
  Filled 2021-02-19 (×6): qty 1

## 2021-02-19 MED ORDER — PHENYLEPHRINE 40 MCG/ML (10ML) SYRINGE FOR IV PUSH (FOR BLOOD PRESSURE SUPPORT)
PREFILLED_SYRINGE | INTRAVENOUS | Status: DC | PRN
  Administered 2021-02-19: 40 ug via INTRAVENOUS
  Administered 2021-02-19: 80 ug via INTRAVENOUS
  Administered 2021-02-19: 40 ug via INTRAVENOUS
  Administered 2021-02-19: 80 ug via INTRAVENOUS
  Administered 2021-02-19: 40 ug via INTRAVENOUS

## 2021-02-19 MED ORDER — AMISULPRIDE (ANTIEMETIC) 5 MG/2ML IV SOLN: 10.0000 mg | Freq: Once | INTRAVENOUS | Status: DC | PRN

## 2021-02-19 NOTE — Procedures (Signed)
S/P BILATERAL COMMON CAROTID ARTERIOGRAMS,AND LT EXTERNAL ARTERIOGRAM FOLLOWED BY NEAR COMPLETE  SUPERSELECTIVE DEVASCULARIZATION OF  LARGE HYPERVASCULAR LT ICA/ECA CAROTID BODY TUMOR WITH ONYX !LIQUID EMBOLIC AND COILS.  Post extubation .pupils 2 t0 3 mm RT = LT I identifies 2 fingers.  Denies any H/A or N/V  . No facial asymmetry. Tongue midline  Moves all 4s spontaneouisly and to command. Distal pulses all intact. S.Kashawna Manzer MD

## 2021-02-19 NOTE — Anesthesia Preprocedure Evaluation (Addendum)
Anesthesia Evaluation  Patient identified by MRN, date of birth, ID band Patient awake    Reviewed: Allergy & Precautions, NPO status , Patient's Chart, lab work & pertinent test results  Airway Mallampati: I  TM Distance: >3 FB Neck ROM: Full    Dental  (+) Teeth Intact, Dental Advisory Given   Pulmonary former smoker,    breath sounds clear to auscultation       Cardiovascular negative cardio ROS   Rhythm:Regular Rate:Normal     Neuro/Psych negative neurological ROS  negative psych ROS   GI/Hepatic Neg liver ROS, GERD  Medicated,  Endo/Other  negative endocrine ROS  Renal/GU negative Renal ROS     Musculoskeletal negative musculoskeletal ROS (+)   Abdominal Normal abdominal exam  (+)   Peds  Hematology negative hematology ROS (+)   Anesthesia Other Findings   Reproductive/Obstetrics                            Anesthesia Physical Anesthesia Plan  ASA: 2  Anesthesia Plan: General   Post-op Pain Management:    Induction: Intravenous  PONV Risk Score and Plan: 2 and Ondansetron and Dexamethasone  Airway Management Planned: Oral ETT  Additional Equipment: Arterial line  Intra-op Plan:   Post-operative Plan: Extubation in OR  Informed Consent: I have reviewed the patients History and Physical, chart, labs and discussed the procedure including the risks, benefits and alternatives for the proposed anesthesia with the patient or authorized representative who has indicated his/her understanding and acceptance.     Dental advisory given  Plan Discussed with: CRNA  Anesthesia Plan Comments:        Anesthesia Quick Evaluation

## 2021-02-19 NOTE — Anesthesia Procedure Notes (Signed)
Procedure Name: Intubation Date/Time: 02/19/2021 9:32 AM Performed by: Ardyth Harps, CRNA Pre-anesthesia Checklist: Patient identified, Emergency Drugs available, Suction available and Patient being monitored Patient Re-evaluated:Patient Re-evaluated prior to induction Oxygen Delivery Method: Circle System Utilized Preoxygenation: Pre-oxygenation with 100% oxygen Induction Type: IV induction Ventilation: Mask ventilation without difficulty Laryngoscope Size: Mac and 4 Grade View: Grade I Tube type: Oral Tube size: 7.5 mm Number of attempts: 1 Airway Equipment and Method: Stylet Placement Confirmation: ETT inserted through vocal cords under direct vision, positive ETCO2 and breath sounds checked- equal and bilateral Secured at: 22 cm Tube secured with: Tape Dental Injury: Teeth and Oropharynx as per pre-operative assessment

## 2021-02-19 NOTE — Anesthesia Procedure Notes (Signed)
Arterial Line Insertion Start/End10/19/2022 8:05 AM, 02/19/2021 8:15 AM Performed by: Ardyth Harps, CRNA, CRNA  Patient location: Pre-op. Preanesthetic checklist: patient identified, IV checked, site marked, risks and benefits discussed, surgical consent, monitors and equipment checked, pre-op evaluation, timeout performed and anesthesia consent Lidocaine 1% used for infiltration Left, radial was placed Catheter size: 20 G Hand hygiene performed  and maximum sterile barriers used   Attempts: 1 Procedure performed without using ultrasound guided technique. Following insertion, dressing applied and Biopatch. Post procedure assessment: normal and unchanged

## 2021-02-19 NOTE — H&P (Signed)
[facility] [place] [facility]   Patient name: Kenneth Mollenkopf Sr.     MRN: [id number]        DOB: 06-Mar-1981            Sex: male     REQUESTING PROVIDER:     Dr. Jilda Panda     REASON FOR CONSULT:    Bilateral carotid body tumors   HISTORY OF PRESENT ILLNESS:    Kenneth Gulino Sr. is a 40 y.o. 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  He is recovering well from this.  There is a significant family history of pheochromocytoma and paraganglioma.  One of his brothers had 2 carotid body tumors and an adrenal tumor addressed.  The other had an adrenal tumor resected.  His family has had genetic testing at [facility].  His father is a carrier.  His testing revealed positivity for SDH D.  As part of his work-up he had carotid CT scan which showed bilateral lesions that were hypermetabolic on PET scan  Prior to his surgical resection he was on alpha blockade and beta-blockade, which have been discontinued.  He carries the diagnosis of hypertension, but is not currently on medications.  He is a former smoker.   PAST MEDICAL HISTORY          Past Medical History:  Diagnosis Date   Family history of pancreatic cancer 09/09/2020   Family history of pheochromocytoma 09/09/2020   Lumbar herniated disc     Monoallelic mutation of SDHD gene 10/01/2020        FAMILY HISTORY         Family History  Problem Relation Age of Onset   Hypertension Father     Pancreatic cancer Maternal Grandmother          dx early 85s   Prostate cancer Paternal Grandfather          d. 39s   Other Half-Brother          paternal; paraganglioma and pheochromocytoma; SDHD mutation    Other Half-Brother          paternal; ? paraganglioma      SOCIAL HISTORY:    Social History         Socioeconomic History   Marital status: Married      Spouse name: Not on file    Number of children: 4   Years of education: Not on file   Highest education level: Not on file  Occupational History   Occupation: Delivery driver for [employer]  Tobacco Use   Smoking status: Former      Types: Cigarettes      Quit date: 08/2020      Years since quitting: 0.4   Smokeless tobacco: Never  Vaping Use   Vaping Use: Former  Substance and Sexual Activity   Alcohol use: Yes      Comment: occassionally- "once every 2 weeks"   Drug use: No   Sexual activity: Yes      Comment: has taken hydrocodone a few days ago  Other Topics Concern   Not on file  Social History Narrative   Not on file    Social Determinants of Health    Financial Resource Strain: Not on file  Food Insecurity: Not on file  Transportation Needs: Not on file  Physical Activity: Not on file  Stress: Not on file  Social Connections: Not  on file  Intimate Partner Violence: Not on file      ALLERGIES:      No Known Allergies   CURRENT MEDICATIONS:            Current Outpatient Medications  Medication Sig Dispense Refill   docusate sodium (COLACE) 100 MG capsule Take 1 capsule (100 mg total) by mouth 2 (two) times daily. 60 capsule 0   methocarbamol (ROBAXIN) 500 MG tablet Take 1 tablet (500 mg total) by mouth every 6 (six) hours as needed for muscle spasms. 20 tablet 0   oxyCODONE (OXY IR/ROXICODONE) 5 MG immediate release tablet Take 1 tablet (5 mg total) by mouth every 4 (four) hours as needed for severe pain. 30 tablet 0   acetaminophen (TYLENOL) 500 MG tablet Take 2 tablets (1,000 mg total) by mouth every 8 (eight) hours as needed for mild pain. (Patient not taking: No sig reported)   0   atenolol (TENORMIN) 25 MG tablet Take 0.5 tablets (12.5 mg total) by mouth daily for 5 days. 3 tablet 0   hydrOXYzine (ATARAX/VISTARIL) 25 MG tablet Take 1 tablet (25 mg total) by mouth every 6 (six) hours. (Patient not taking: No sig reported) 12 tablet 0   omeprazole (PRILOSEC) 40 MG capsule Take 1  capsule (40 mg total) by mouth daily. (Patient not taking: No sig reported) 30 capsule 1   ondansetron (ZOFRAN-ODT) 4 MG disintegrating tablet Take 1 tablet (4 mg total) by mouth every 6 (six) hours as needed for nausea. (Patient not taking: No sig reported) 20 tablet 0   polyethylene glycol (MIRALAX / GLYCOLAX) 17 g packet Take 17 g by mouth daily as needed (constipation). (Patient not taking: No sig reported) 14 each 0    No current facility-administered medications for this visit.      REVIEW OF SYSTEMS:    [X]  denotes positive finding, [ ]  denotes negative finding Cardiac   Comments:  Chest pain or chest pressure:      Shortness of breath upon exertion:      Short of breath when lying flat:      Irregular heart rhythm:             Vascular      Pain in calf, thigh, or hip brought on by ambulation:      Pain in feet at night that wakes you up from your sleep:       Blood clot in your veins:      Leg swelling:              Pulmonary      Oxygen at home:      Productive cough:       Wheezing:              Neurologic      Sudden weakness in arms or legs:       Sudden numbness in arms or legs:       Sudden onset of difficulty speaking or slurred speech:      Temporary loss of vision in one eye:       Problems with dizziness:              Gastrointestinal      Blood in stool:         Vomited blood:              Genitourinary      Burning when urinating:       Blood in urine:  Psychiatric      Major depression:              Hematologic      Bleeding problems:      Problems with blood clotting too easily:             Skin      Rashes or ulcers:             Constitutional      Fever or chills:        PHYSICAL EXAM:        Vitals:    12/30/20 0913 12/30/20 0915  BP: 114/79 119/80  Pulse: 70 73  Resp: 16    Temp: (!) 97.5 F (36.4 C)    TempSrc: Temporal    SpO2: 98%    Weight: 160 lb (72.6 kg)    Height: 6' (1.829 m)        GENERAL: The  patient is a well-nourished male, in no acute distress. The vital signs are documented above. CARDIAC: There is a regular rate and rhythm.  VASCULAR: Palpable pedal pulses.  Carotid lesions are easily palpable PULMONARY: Nonlabored respirations ABDOMEN: Soft and non-tender midline incision healing MUSCULOSKELETAL: There are no major deformities or cyanosis. NEUROLOGIC: No focal weakness or paresthesias are detected. SKIN: There are no ulcers or rashes noted. PSYCHIATRIC: The patient has a normal affect.   STUDIES:    I have reviewed his neck CT scan with the following findings: 1. Bilateral carotid bifurcation tumors measuring up to 2.7 cm on the right and 3 cm on the left, most consistent with carotid body tumors. 2. Inflammatory paranasal sinus disease. Correlate clinically for acute sinusitis. ASSESSMENT and PLAN    Bilateral carotid body tumor, left greater than right: The patient has recently had a paraganglioma removed off of the left adrenal gland.  He has 2 what appear to be carotid body tumors.  I discussed resecting these in a staged fashion.  I will do this in conjunction with ENT.  I have sent a referral to them.  I am also getting a CT angiogram to better define his arterial anatomy.  I would like for the patient to have preoperative embolization.  We will try to coordinate this within the next month as the patient would like to get it done before the end of September as he is going to see the [employer] [facility] in [place] in October.  We discussed complications of surgery including nerve injury, bleeding, possibility of arterial repair.  He understands this and wishes to proceed.     Leia Alf, MD, FACS [facility] [phone] Pager [phone]

## 2021-02-19 NOTE — Transfer of Care (Signed)
Immediate Anesthesia Transfer of Care Note  Patient: Kenneth Rumps Sr.  Procedure(s) Performed: EMBOLIZATION  Patient Location: PACU  Anesthesia Type:General  Level of Consciousness: awake and drowsy  Airway & Oxygen Therapy: Patient Spontanous Breathing and Patient connected to face mask oxygen  Post-op Assessment: Report given to RN and Post -op Vital signs reviewed and stable  Post vital signs: Reviewed and stable  Last Vitals:  Vitals Value Taken Time  BP 120/90 02/19/21 1218  Temp    Pulse 89 02/19/21 1229  Resp 20 02/19/21 1229  SpO2 100 % 02/19/21 1229  Vitals shown include unvalidated device data.  Last Pain:  Vitals:   02/19/21 0720  TempSrc:   PainSc: 0-No pain         Complications: No notable events documented.

## 2021-02-19 NOTE — H&P (Signed)
Chief Complaint: Patient was seen in consultation today for Left carotid body tumor embolization at the request of Dr Trula Slade   Supervising Physician: Luanne Bras  Patient Status: [facility] - Out-pt  History of Present Illness: Kenneth Nohr Sr. is a 40 y.o. 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 Bilateral hypermetabolic soft tissue lesions in the neck, in the region of each carotid bifurcation. Given the reported family history, bilateral glomus tumor would be a distinct consideration. Neck CT with contrast recommended to further evaluate. 2. Known left retroperitoneal lesion is markedly hypermetabolic. 3. No evidence for hypermetabolic disease in the chest or pelvis. No unexpected hypermetabolism in the bony anatomy or lower extremities.  CTA 01/01/21: IMPRESSION: Unchanged avidly enhancing soft tissue masses at the level of the carotid bifurcations bilaterally, splaying the internal carotid and external carotid arteries. As before, the mass on the right measures 1.6 x 2.7 cm, and the mass on the left measures 2.6 x 3.0 cm. The location and imaging appearance are most compatible with carotid body tumors.      There are nodules within the imaged lung apices bilaterally measuring up to 9 mm, as described new from the neck CT of 10/28/2020. Although nonspecific, lung metastases cannot be excluded and a dedicated chest CT is recommended for further evaluation.      Streak and beam hardening artifact arising from a dense right-sided contrast bolus partially obscures the V1 right vertebral artery. The visible common carotid, internal carotid and vertebral arteries are patent within the neck without stenosis. Minimal soft plaque within the carotid bifurcations and  proximal internal carotid arteries bilaterally. Paranasal sinus disease, as described. Correlate for acute sinusitis.   Scheduled for Left carotic body tumor resection in OR 10/21 Scheduled today with NIR Dr Estanislado Pandy for L carotid body tumor embolization-- per surgical procedure  Past Medical History:  Diagnosis Date   Family history of pancreatic cancer 09/09/2020   Family history of pheochromocytoma 09/09/2020   Lumbar herniated disc    Monoallelic mutation of SDHD gene 10/01/2020    Past Surgical History:  Procedure Laterality Date   HIP SURGERY     RESECTION OF RETROPERITONEAL MASS N/A 12/16/2020   Procedure: EXCISION OF RETROPERITONEAL MASS PHEOCHROMOCYTOMA;  Surgeon: Dwan Bolt, MD;  Location: [facility];  Service: General;  Laterality: N/A;    Allergies: Patient has no known allergies.  Medications: Prior to Admission medications   Medication Sig Start Date End Date Taking? Authorizing Provider  acetaminophen (TYLENOL) 500 MG tablet Take 2 tablets (1,000 mg total) by mouth every 8 (eight) hours as needed for mild pain. Patient not taking: No sig reported 12/19/20   Dwan Bolt, MD  atenolol (TENORMIN) 25 MG tablet Take 0.5 tablets (12.5 mg total) by mouth daily for 5 days. Patient not taking: No sig reported 12/19/20 12/24/20  Dwan Bolt, MD  docusate sodium (COLACE) 100 MG capsule Take 1 capsule (100 mg total) by mouth 2 (two) times daily. Patient not taking: No sig reported 12/19/20   Dwan Bolt, MD  hydrOXYzine (ATARAX/VISTARIL) 25 MG tablet Take 1 tablet (25 mg total) by mouth every 6 (six) hours. Patient not taking: No sig reported 09/11/19   Ok Edwards, PA-C  methocarbamol (ROBAXIN) 500 MG tablet Take 1 tablet (500 mg total) by mouth every 6 (  six) hours as needed for muscle spasms. Patient not taking: No sig reported 12/19/20   Dwan Bolt, MD  omeprazole (PRILOSEC) 40 MG capsule Take 1 capsule (40 mg total) by mouth daily. Patient not taking: No sig  reported 08/26/20   Maczis, Barth Kirks, PA-C  ondansetron (ZOFRAN-ODT) 4 MG disintegrating tablet Take 1 tablet (4 mg total) by mouth every 6 (six) hours as needed for nausea. Patient not taking: No sig reported 08/26/20   Maczis, Barth Kirks, PA-C  polyethylene glycol (MIRALAX / GLYCOLAX) 17 g packet Take 17 g by mouth daily as needed (constipation). Patient not taking: No sig reported 12/19/20   Dwan Bolt, MD     Family History  Problem Relation Age of Onset   Hypertension Father    Pancreatic cancer Maternal Grandmother        dx early 8s   Prostate cancer Paternal Grandfather        d. 54s   Other Half-Brother        paternal; paraganglioma and pheochromocytoma; SDHD mutation    Other Half-Brother        paternal; ? paraganglioma    Social History   Socioeconomic History   Marital status: Married    Spouse name: Not on file   Number of children: 4   Years of education: Not on file   Highest education level: Not on file  Occupational History   Occupation: Delivery driver for [employer]  Tobacco Use   Smoking status: Former    Types: Cigarettes    Quit date: 08/2020    Years since quitting: 0.5   Smokeless tobacco: Never  Vaping Use   Vaping Use: Former  Substance and Sexual Activity   Alcohol use: Yes    Comment: occassionally- "once every 2 weeks"   Drug use: No   Sexual activity: Yes  Other Topics Concern   Not on file  Social History Narrative   Not on file   Social Determinants of Health   Financial Resource Strain: Not on file  Food Insecurity: Not on file  Transportation Needs: Not on file  Physical Activity: Not on file  Stress: Not on file  Social Connections: Not on file     Review of Systems: A 12 point ROS discussed and pertinent positives are indicated in the HPI above.  All other systems are negative.  Review of Systems  Constitutional:  Negative for activity change, fatigue, fever and unexpected weight change.  HENT:  Negative for  tinnitus, trouble swallowing and voice change.   Eyes:  Negative for visual disturbance.  Respiratory:  Negative for cough and shortness of breath.   Gastrointestinal:  Negative for abdominal pain.  Musculoskeletal:  Negative for gait problem.  Neurological:  Negative for weakness.  Psychiatric/Behavioral:  Negative for behavioral problems and confusion.    Vital Signs: BP 124/90   Pulse 69   Temp 97.8 F (36.6 C) (Oral)   Resp 18   Ht 6' (1.829 m)   Wt 163 lb (73.9 kg)   SpO2 100%   BMI 22.11 kg/m   Physical Exam Vitals reviewed.  HENT:     Mouth/Throat:     Mouth: Mucous membranes are moist.  Cardiovascular:     Rate and Rhythm: Normal rate and regular rhythm.     Heart sounds: Normal heart sounds.  Pulmonary:     Effort: Pulmonary effort is normal.     Breath sounds: Normal breath sounds.  Abdominal:     Palpations:  Abdomen is soft.     Tenderness: There is no abdominal tenderness.  Musculoskeletal:        General: Normal range of motion.     Right lower leg: No edema.     Left lower leg: No edema.  Skin:    General: Skin is warm.  Neurological:     Mental Status: He is alert and oriented to person, place, and time.  Psychiatric:        Mood and Affect: Mood normal.        Behavior: Behavior normal.        Thought Content: Thought content normal.        Judgment: Judgment normal.    Imaging: No results found.  Labs:  CBC: Recent Labs    12/14/20 2128 12/16/20 1016 12/17/20 0609 02/14/21 0829  WBC 7.8 18.7* 14.1* 7.5  HGB 12.7* 10.2* 11.6* 13.0  HCT 38.7* 32.3* 36.5* 41.8  PLT 250 208 255 289    COAGS: Recent Labs    12/03/20 1540 12/14/20 2128 02/14/21 0829  INR 1.0 1.0 1.1  APTT  --   --  31    BMP: Recent Labs    12/14/20 2128 12/16/20 1016 12/17/20 0609 02/14/21 0829  NA 139 138 137 137  K 3.6 3.5 3.8 3.7  CL 108 108 104 105  CO2 25 23 23 26   GLUCOSE 115* 153* 106* 90  BUN 9 7 9 6   CALCIUM 9.4 8.6* 9.4 9.3  CREATININE  1.29* 1.23 1.11 1.25*  GFRNONAA >60 >60 >60 >60    LIVER FUNCTION TESTS: Recent Labs    08/25/20 1145 02/14/21 0829  BILITOT 0.7 0.7  AST 28 19  ALT 47* 25  ALKPHOS 62 70  PROT 7.6 7.0  ALBUMIN 4.4 3.8    TUMOR MARKERS: No results for input(s): AFPTM, CEA, CA199, CHROMGRNA in the last 8760 hours.  Assessment and Plan:  Hereditary paraganglioma Bilateral carotid body tumor Scheduled today for Left carotid body tumor embolization Pre surgical procedure--- scheduled for OR with Dr Trula Slade 02/21/21 Risks and benefits of cerebral angiogram with intervention were discussed with the patient including, but not limited to bleeding, infection, vascular injury, contrast induced renal failure, stroke or even death.  This interventional procedure involves the use of X-rays and because of the nature of the planned procedure, it is possible that we will have prolonged use of X-ray fluoroscopy.  Potential radiation risks to you include (but are not limited to) the following: - A slightly elevated risk for cancer  several years later in life. This risk is typically less than 0.5% percent. This risk is low in comparison to the normal incidence of human cancer, which is 33% for women and 50% for men according to the [employer]. - Radiation induced injury can include skin redness, resembling a rash, tissue breakdown / ulcers and hair loss (which can be temporary or permanent).   The likelihood of either of these occurring depends on the difficulty of the procedure and whether you are sensitive to radiation due to previous procedures, disease, or genetic conditions.   IF your procedure requires a prolonged use of radiation, you will be notified and given written instructions for further action.  It is your responsibility to monitor the irradiated area for the 2 weeks following the procedure and to notify your physician if you are concerned that you have suffered a radiation induced  injury.    All of the patient's questions were answered, patient is  agreeable to proceed.  Consent signed and in chart.   Thank you for this interesting consult.  I greatly enjoyed meeting Rob Mciver Sr. and look forward to participating in their care.  A copy of this report was sent to the requesting provider on this date.  Electronically Signed: Lavonia Drafts, PA-C 02/19/2021, 7:05 AM   I spent a total of  30 Minutes   in face to face in clinical consultation, greater than 50% of which was counseling/coordinating care for left carotid body tumor embolization

## 2021-02-19 NOTE — Progress Notes (Signed)
S/P BILATERAL COMMON CAROTID ARTERIOGRAMS,AND LT EXTERNAL ARTERIOGRAM FOLLOWED BY NEAR COMPLETE  SUPERSELECTIVE DEVASCULARIZATION OF  LARGE HYPERVASCULAR LT ICA/ECA CAROTID BODY TUMOR WITH ONYX LIQUID EMBOLIC AND COILS.  Pt seen in Neuro ICU Wife at bedside  Feels good overall. Mild left neck discomfort as expected post procedure. Denies HA, N/V, vision trouble.  BP 121/70   Pulse 99   Temp 97.8 F (36.6 C) (Oral)   Resp 13   Ht 6' (1.829 m)   Wt 73.9 kg   SpO2 100%   BMI 22.11 kg/m  PERRLA, EOMI Face symmetric, tongue midline (R)groin arterial access site clean, dry, soft, NT Leg/feet warm.  Ascencion Dike PA-C Interventional Radiology 02/19/2021 3:40 PM

## 2021-02-20 ENCOUNTER — Encounter (HOSPITAL_COMMUNITY): Payer: Self-pay | Admitting: Interventional Radiology

## 2021-02-20 LAB — CBC WITH DIFFERENTIAL/PLATELET
Abs Immature Granulocytes: 0.04 10*3/uL (ref 0.00–0.07)
Basophils Absolute: 0 10*3/uL (ref 0.0–0.1)
Basophils Relative: 0 %
Eosinophils Absolute: 0 10*3/uL (ref 0.0–0.5)
Eosinophils Relative: 0 %
HCT: 37.6 % — ABNORMAL LOW (ref 39.0–52.0)
Hemoglobin: 12.3 g/dL — ABNORMAL LOW (ref 13.0–17.0)
Immature Granulocytes: 0 %
Lymphocytes Relative: 19 %
Lymphs Abs: 2.7 10*3/uL (ref 0.7–4.0)
MCH: 25.8 pg — ABNORMAL LOW (ref 26.0–34.0)
MCHC: 32.7 g/dL (ref 30.0–36.0)
MCV: 79 fL — ABNORMAL LOW (ref 80.0–100.0)
Monocytes Absolute: 1 10*3/uL (ref 0.1–1.0)
Monocytes Relative: 7 %
Neutro Abs: 11 10*3/uL — ABNORMAL HIGH (ref 1.7–7.7)
Neutrophils Relative %: 74 %
Platelets: 286 10*3/uL (ref 150–400)
RBC: 4.76 MIL/uL (ref 4.22–5.81)
RDW: 16.6 % — ABNORMAL HIGH (ref 11.5–15.5)
WBC: 14.8 10*3/uL — ABNORMAL HIGH (ref 4.0–10.5)
nRBC: 0 % (ref 0.0–0.2)

## 2021-02-20 LAB — POCT ACTIVATED CLOTTING TIME
Activated Clotting Time: 214 seconds
Activated Clotting Time: 231 seconds
Activated Clotting Time: 242 seconds

## 2021-02-20 LAB — BASIC METABOLIC PANEL
Anion gap: 8 (ref 5–15)
BUN: 10 mg/dL (ref 6–20)
CO2: 23 mmol/L (ref 22–32)
Calcium: 9.2 mg/dL (ref 8.9–10.3)
Chloride: 106 mmol/L (ref 98–111)
Creatinine, Ser: 1.02 mg/dL (ref 0.61–1.24)
GFR, Estimated: >60 mL/min (ref >60)
Glucose, Bld: 115 mg/dL — ABNORMAL HIGH (ref 70–99)
Potassium: 3.7 mmol/L (ref 3.5–5.1)
Sodium: 137 mmol/L (ref 135–145)

## 2021-02-20 MED ORDER — SODIUM CHLORIDE 0.9 % IV SOLN: INTRAVENOUS | Status: DC

## 2021-02-20 MED ORDER — CHLORHEXIDINE GLUCONATE CLOTH 2 % EX PADS
6.0000 | MEDICATED_PAD | Freq: Once | CUTANEOUS | Status: AC
  Administered 2021-02-20: 6 via TOPICAL

## 2021-02-20 MED ORDER — CHLORHEXIDINE GLUCONATE CLOTH 2 % EX PADS: 6.0000 | MEDICATED_PAD | Freq: Once | CUTANEOUS | Status: DC

## 2021-02-20 MED ORDER — CEFAZOLIN SODIUM-DEXTROSE 2-4 GM/100ML-% IV SOLN
2.0000 g | INTRAVENOUS | Status: AC
  Administered 2021-02-21: 2 g via INTRAVENOUS
  Filled 2021-02-20: qty 100

## 2021-02-20 NOTE — Anesthesia Postprocedure Evaluation (Signed)
Anesthesia Post Note  Patient: Kenneth Aspinall Sr.  Procedure(s) Performed: EMBOLIZATION     Patient location during evaluation: PACU Anesthesia Type: General Level of consciousness: awake and alert Pain management: pain level controlled Vital Signs Assessment: post-procedure vital signs reviewed and stable Respiratory status: spontaneous breathing, nonlabored ventilation, respiratory function stable and patient connected to nasal cannula oxygen Cardiovascular status: blood pressure returned to baseline and stable Postop Assessment: no apparent nausea or vomiting Anesthetic complications: no   No notable events documented.              Effie Berkshire

## 2021-02-20 NOTE — Progress Notes (Signed)
VO from Dr. Estanislado Pandy- use cuff for blood pressure, patient may walk in room and in hallway.

## 2021-02-20 NOTE — Anesthesia Preprocedure Evaluation (Addendum)
Anesthesia Evaluation  Patient identified by MRN, date of birth, ID band Patient awake    Reviewed: Allergy & Precautions, NPO status , Patient's Chart, lab work & pertinent test results  History of Anesthesia Complications Negative for: history of anesthetic complications  Airway Mallampati: II  TM Distance: >3 FB Neck ROM: Full    Dental no notable dental hx. (+) Dental Advisory Given   Pulmonary former smoker,    Pulmonary exam normal        Cardiovascular negative cardio ROS Normal cardiovascular exam     Neuro/Psych negative neurological ROS  negative psych ROS   GI/Hepatic Neg liver ROS, GERD  Medicated,  Endo/Other  negative endocrine ROS  Renal/GU negative Renal ROS     Musculoskeletal negative musculoskeletal ROS (+)   Abdominal Normal abdominal exam  (+)   Peds  Hematology negative hematology ROS (+)   Anesthesia Other Findings   Reproductive/Obstetrics                            Anesthesia Physical  Anesthesia Plan  ASA: 2  Anesthesia Plan: General   Post-op Pain Management:    Induction: Intravenous  PONV Risk Score and Plan: 2 and Ondansetron and Dexamethasone  Airway Management Planned: Oral ETT  Additional Equipment:   Intra-op Plan:   Post-operative Plan: Extubation in OR  Informed Consent: I have reviewed the patients History and Physical, chart, labs and discussed the procedure including the risks, benefits and alternatives for the proposed anesthesia with the patient or authorized representative who has indicated his/her understanding and acceptance.     Dental advisory given  Plan Discussed with: Anesthesiologist and CRNA  Anesthesia Plan Comments: (clearsite)       Anesthesia Quick Evaluation

## 2021-02-20 NOTE — Progress Notes (Signed)
Referring Physician(s): Harold Barban  Supervising Physician: Luanne Bras  Patient Status:  [facility] - In-pt  Chief Complaint: Follow up left carotid body tumor embolization  Subjective:  Patient seen in his room, he denies any complaints - he's been walking around in his room, eating, drinking, urinating without difficulty. He had a mild headache earlier which resolved with Tylenol. He is very grateful for the care that he's received so far.  Allergies: Patient has no known allergies.  Medications: Prior to Admission medications   Medication Sig Start Date End Date Taking? Authorizing Provider  acetaminophen (TYLENOL) 500 MG tablet Take 2 tablets (1,000 mg total) by mouth every 8 (eight) hours as needed for mild pain. Patient not taking: No sig reported 12/19/20   Dwan Bolt, MD  atenolol (TENORMIN) 25 MG tablet Take 0.5 tablets (12.5 mg total) by mouth daily for 5 days. Patient not taking: No sig reported 12/19/20 12/24/20  Dwan Bolt, MD  docusate sodium (COLACE) 100 MG capsule Take 1 capsule (100 mg total) by mouth 2 (two) times daily. Patient not taking: No sig reported 12/19/20   Dwan Bolt, MD  hydrOXYzine (ATARAX/VISTARIL) 25 MG tablet Take 1 tablet (25 mg total) by mouth every 6 (six) hours. Patient not taking: No sig reported 09/11/19   Ok Edwards, PA-C  methocarbamol (ROBAXIN) 500 MG tablet Take 1 tablet (500 mg total) by mouth every 6 (six) hours as needed for muscle spasms. Patient not taking: No sig reported 12/19/20   Dwan Bolt, MD  omeprazole (PRILOSEC) 40 MG capsule Take 1 capsule (40 mg total) by mouth daily. Patient not taking: No sig reported 08/26/20   Maczis, Barth Kirks, PA-C  ondansetron (ZOFRAN-ODT) 4 MG disintegrating tablet Take 1 tablet (4 mg total) by mouth every 6 (six) hours as needed for nausea. Patient not taking: No sig reported 08/26/20   Maczis, Barth Kirks, PA-C  polyethylene glycol (MIRALAX / GLYCOLAX) 17 g packet Take 17 g by  mouth daily as needed (constipation). Patient not taking: No sig reported 12/19/20   Dwan Bolt, MD     Vital Signs: BP 120/78   Pulse 86   Temp 98.2 F (36.8 C)   Resp 19   Ht 6' (1.829 m)   Wt 163 lb (73.9 kg)   SpO2 99%   BMI 22.11 kg/m   Physical Exam Vitals and nursing note reviewed.  Constitutional:      General: He is not in acute distress. HENT:     Head: Normocephalic.  Cardiovascular:     Rate and Rhythm: Normal rate and regular rhythm.  Pulmonary:     Effort: Pulmonary effort is normal.     Breath sounds: Normal breath sounds.  Abdominal:     Palpations: Abdomen is soft.  Neurological:     General: No focal deficit present.     Mental Status: He is alert and oriented to person, place, and time.  Psychiatric:        Mood and Affect: Mood normal.        Behavior: Behavior normal.        Thought Content: Thought content normal.        Judgment: Judgment normal.    Imaging: No results found.  Labs:  CBC: Recent Labs    12/17/20 0609 02/14/21 0829 02/19/21 1514 02/20/21 0320  WBC 14.1* 7.5 12.5* 14.8*  HGB 11.6* 13.0 12.3* 12.3*  HCT 36.5* 41.8 38.8* 37.6*  PLT 255 289 310  286    COAGS: Recent Labs    12/03/20 1540 12/14/20 2128 02/14/21 0829  INR 1.0 1.0 1.1  APTT  --   --  31    BMP: Recent Labs    12/17/20 0609 02/14/21 0829 02/19/21 1514 02/20/21 0320  NA 137 137 137 137  K 3.8 3.7 4.0 3.7  CL 104 105 110 106  CO2 23 26 22 23   GLUCOSE 106* 90 160* 115*  BUN 9 6 9 10   CALCIUM 9.4 9.3 9.1 9.2  CREATININE 1.11 1.25* 1.10 1.02  GFRNONAA >60 >60 >60 >60    LIVER FUNCTION TESTS: Recent Labs    08/25/20 1145 02/14/21 0829  BILITOT 0.7 0.7  AST 28 19  ALT 47* 25  ALKPHOS 62 70  PROT 7.6 7.0  ALBUMIN 4.4 3.8    Assessment and Plan:  40 y/o M s/p left carotid body tumor embolization yesterday in NIR seen today for post procedure follow up.  Patient doing well, no complaints currently. Ambulating, tolerating PO,  voiding. Art line to remain in place for vascular procedure tomorrow.  No further NIR needs at this time - plans per vascular surgery.  Electronically Signed: Joaquim Nam, PA-C 02/20/2021, 1:58 PM   I spent a total of 15 Minutes at the the patient's bedside AND on the patient's hospital floor or unit, greater than 50% of which was counseling/coordinating care for left carotid body tumor embolization follow up.

## 2021-02-20 NOTE — Progress Notes (Signed)
Two orders for informed consent for the surgery scheduled for tomorrow. This nurse used [place] to attempt to contact Dr. Trula Slade as his status was "available" to verify which order to use. Still awaiting response.  No other contact information available for Dr. Trula Slade.

## 2021-02-21 ENCOUNTER — Inpatient Hospital Stay (HOSPITAL_COMMUNITY): Payer: BC Managed Care – PPO | Admitting: Anesthesiology

## 2021-02-21 ENCOUNTER — Encounter (HOSPITAL_COMMUNITY)
Admission: AD | Disposition: A | Discharge status: Home / Self Care | Payer: Self-pay | Source: Ambulatory Visit | Attending: Surgery

## 2021-02-21 ENCOUNTER — Ambulatory Visit (HOSPITAL_COMMUNITY): Admission: RE | Admit: 2021-02-21 | Payer: BC Managed Care – PPO | Source: Ambulatory Visit | Admitting: Surgery

## 2021-02-21 DIAGNOSIS — D446 Neoplasm of uncertain behavior of carotid body: Secondary | ICD-10-CM

## 2021-02-21 DIAGNOSIS — Z9889 Other specified postprocedural states: Secondary | ICD-10-CM

## 2021-02-21 DIAGNOSIS — Z8249 Family history of ischemic heart disease and other diseases of the circulatory system: Secondary | ICD-10-CM

## 2021-02-21 DIAGNOSIS — Z87891 Personal history of nicotine dependence: Secondary | ICD-10-CM

## 2021-02-21 DIAGNOSIS — Z8546 Personal history of malignant neoplasm of prostate: Secondary | ICD-10-CM

## 2021-02-21 DIAGNOSIS — Z8349 Family history of other endocrine, nutritional and metabolic diseases: Secondary | ICD-10-CM

## 2021-02-21 DIAGNOSIS — Z809 Family history of malignant neoplasm, unspecified: Secondary | ICD-10-CM

## 2021-02-21 HISTORY — PX: CAROTID BODY TUMOR EXCISION: SHX5156

## 2021-02-21 LAB — CBC
HCT: 40.3 % (ref 39.0–52.0)
Hemoglobin: 12.8 g/dL — ABNORMAL LOW (ref 13.0–17.0)
MCH: 25.5 pg — ABNORMAL LOW (ref 26.0–34.0)
MCHC: 31.8 g/dL (ref 30.0–36.0)
MCV: 80.4 fL (ref 80.0–100.0)
Platelets: 310 10*3/uL (ref 150–400)
RBC: 5.01 MIL/uL (ref 4.22–5.81)
RDW: 17.1 % — ABNORMAL HIGH (ref 11.5–15.5)
WBC: 12.5 10*3/uL — ABNORMAL HIGH (ref 4.0–10.5)
nRBC: 0 % (ref 0.0–0.2)

## 2021-02-21 LAB — BASIC METABOLIC PANEL
Anion gap: 10 (ref 5–15)
BUN: 7 mg/dL (ref 6–20)
CO2: 22 mmol/L (ref 22–32)
Calcium: 9.5 mg/dL (ref 8.9–10.3)
Chloride: 106 mmol/L (ref 98–111)
Creatinine, Ser: 1.1 mg/dL (ref 0.61–1.24)
GFR, Estimated: >60 mL/min (ref >60)
Glucose, Bld: 96 mg/dL (ref 70–99)
Potassium: 3.7 mmol/L (ref 3.5–5.1)
Sodium: 138 mmol/L (ref 135–145)

## 2021-02-21 SURGERY — EXCISION, NEOPLASM, CAROTID BODY
Anesthesia: General | Laterality: Left

## 2021-02-21 MED ORDER — AMISULPRIDE (ANTIEMETIC) 5 MG/2ML IV SOLN: 10.0000 mg | Freq: Once | INTRAVENOUS | Status: DC | PRN

## 2021-02-21 MED ORDER — SODIUM CHLORIDE 0.9 % IV SOLN: 500.0000 mL | Freq: Once | INTRAVENOUS | Status: DC | PRN

## 2021-02-21 MED ORDER — PROPOFOL 10 MG/ML IV BOLUS
INTRAVENOUS | Status: DC | PRN
  Administered 2021-02-21: 40 mg via INTRAVENOUS
  Administered 2021-02-21: 150 mg via INTRAVENOUS

## 2021-02-21 MED ORDER — FENTANYL CITRATE (PF) 250 MCG/5ML IJ SOLN
INTRAMUSCULAR | Status: DC | PRN
  Administered 2021-02-21: 100 ug via INTRAVENOUS

## 2021-02-21 MED ORDER — SODIUM CHLORIDE 0.9 % IV SOLN: INTRAVENOUS | Status: DC

## 2021-02-21 MED ORDER — 0.9 % SODIUM CHLORIDE (POUR BTL) OPTIME
TOPICAL | Status: DC | PRN
  Administered 2021-02-21: 1000 mL

## 2021-02-21 MED ORDER — ONDANSETRON HCL 4 MG/2ML IJ SOLN
4.0000 mg | Freq: Four times a day (QID) | INTRAMUSCULAR | Status: DC | PRN
  Administered 2021-02-21: 4 mg via INTRAVENOUS
  Filled 2021-02-21: qty 2

## 2021-02-21 MED ORDER — ACETAMINOPHEN 650 MG RE SUPP: 325.0000 mg | RECTAL | Status: DC | PRN

## 2021-02-21 MED ORDER — SUGAMMADEX SODIUM 200 MG/2ML IV SOLN
INTRAVENOUS | Status: DC | PRN
  Administered 2021-02-21: 200 mg via INTRAVENOUS

## 2021-02-21 MED ORDER — LABETALOL HCL 5 MG/ML IV SOLN
INTRAVENOUS | Status: DC | PRN
  Administered 2021-02-21 (×2): 5 mg via INTRAVENOUS

## 2021-02-21 MED ORDER — MAGNESIUM SULFATE 2 GM/50ML IV SOLN: 2.0000 g | Freq: Every day | INTRAVENOUS | Status: DC | PRN

## 2021-02-21 MED ORDER — ALUM & MAG HYDROXIDE-SIMETH 200-200-20 MG/5ML PO SUSP: 15.0000 mL | ORAL | Status: DC | PRN

## 2021-02-21 MED ORDER — SENNOSIDES-DOCUSATE SODIUM 8.6-50 MG PO TABS: 1.0000 | ORAL_TABLET | Freq: Every evening | ORAL | Status: DC | PRN

## 2021-02-21 MED ORDER — METOPROLOL TARTRATE 5 MG/5ML IV SOLN: 2.0000 mg | INTRAVENOUS | Status: DC | PRN

## 2021-02-21 MED ORDER — PHENYLEPHRINE HCL-NACL 20-0.9 MG/250ML-% IV SOLN
INTRAVENOUS | Status: DC | PRN
  Administered 2021-02-21: 40 ug/min via INTRAVENOUS

## 2021-02-21 MED ORDER — PANTOPRAZOLE SODIUM 40 MG PO TBEC
40.0000 mg | DELAYED_RELEASE_TABLET | Freq: Every day | ORAL | Status: DC
  Administered 2021-02-21 – 2021-02-22 (×2): 40 mg via ORAL
  Filled 2021-02-21 (×2): qty 1

## 2021-02-21 MED ORDER — POTASSIUM CHLORIDE CRYS ER 20 MEQ PO TBCR: 20.0000 meq | EXTENDED_RELEASE_TABLET | Freq: Every day | ORAL | Status: DC | PRN

## 2021-02-21 MED ORDER — MIDAZOLAM HCL 5 MG/5ML IJ SOLN
INTRAMUSCULAR | Status: DC | PRN
Start: 2021-02-21 — End: 2021-02-21
  Administered 2021-02-21: 1 mg via INTRAVENOUS

## 2021-02-21 MED ORDER — PROMETHAZINE HCL 25 MG/ML IJ SOLN
6.2500 mg | INTRAMUSCULAR | Status: DC | PRN
Start: 2021-02-21 — End: 2021-02-21

## 2021-02-21 MED ORDER — EPHEDRINE SULFATE-NACL 50-0.9 MG/10ML-% IV SOSY
PREFILLED_SYRINGE | INTRAVENOUS | Status: DC | PRN
  Administered 2021-02-21: 5 mg via INTRAVENOUS

## 2021-02-21 MED ORDER — ESMOLOL HCL 100 MG/10ML IV SOLN
INTRAVENOUS | Status: DC | PRN
  Administered 2021-02-21: 20 mg via INTRAVENOUS

## 2021-02-21 MED ORDER — ACETAMINOPHEN 325 MG PO TABS: 325.0000 mg | ORAL_TABLET | ORAL | Status: DC | PRN

## 2021-02-21 MED ORDER — CEFAZOLIN SODIUM-DEXTROSE 2-4 GM/100ML-% IV SOLN
2.0000 g | Freq: Three times a day (TID) | INTRAVENOUS | Status: AC
  Administered 2021-02-21 – 2021-02-22 (×2): 2 g via INTRAVENOUS
  Filled 2021-02-21 (×2): qty 100

## 2021-02-21 MED ORDER — PHENYLEPHRINE 40 MCG/ML (10ML) SYRINGE FOR IV PUSH (FOR BLOOD PRESSURE SUPPORT)
PREFILLED_SYRINGE | INTRAVENOUS | Status: DC | PRN
  Administered 2021-02-21: 120 ug via INTRAVENOUS
  Administered 2021-02-21: 80 ug via INTRAVENOUS
  Administered 2021-02-21: 120 ug via INTRAVENOUS

## 2021-02-21 MED ORDER — MORPHINE SULFATE (PF) 2 MG/ML IV SOLN
2.0000 mg | INTRAVENOUS | Status: DC | PRN
  Administered 2021-02-21: 4 mg via INTRAVENOUS
  Filled 2021-02-21: qty 2

## 2021-02-21 MED ORDER — LABETALOL HCL 5 MG/ML IV SOLN
5.0000 mg | Freq: Once | INTRAVENOUS | Status: AC
  Administered 2021-02-21: 5 mg via INTRAVENOUS

## 2021-02-21 MED ORDER — OXYCODONE HCL 5 MG PO TABS
5.0000 mg | ORAL_TABLET | ORAL | Status: DC | PRN
  Administered 2021-02-21: 10 mg via ORAL
  Filled 2021-02-21: qty 2

## 2021-02-21 MED ORDER — DOCUSATE SODIUM 100 MG PO CAPS
100.0000 mg | ORAL_CAPSULE | Freq: Every day | ORAL | Status: DC
  Administered 2021-02-22: 100 mg via ORAL
  Filled 2021-02-21: qty 1

## 2021-02-21 MED ORDER — HEMOSTATIC AGENTS (NO CHARGE) OPTIME
TOPICAL | Status: DC | PRN
  Administered 2021-02-21: 1 via TOPICAL

## 2021-02-21 MED ORDER — LACTATED RINGERS IV SOLN: INTRAVENOUS | Status: DC | PRN

## 2021-02-21 MED ORDER — PHENOL 1.4 % MT LIQD
1.0000 | OROMUCOSAL | Status: DC | PRN
  Administered 2021-02-22: 1 via OROMUCOSAL
  Filled 2021-02-21: qty 177

## 2021-02-21 MED ORDER — GUAIFENESIN-DM 100-10 MG/5ML PO SYRP: 15.0000 mL | ORAL_SOLUTION | ORAL | Status: DC | PRN

## 2021-02-21 MED ORDER — ROCURONIUM BROMIDE 100 MG/10ML IV SOLN
INTRAVENOUS | Status: DC | PRN
  Administered 2021-02-21: 15 mg via INTRAVENOUS
  Administered 2021-02-21: 10 mg via INTRAVENOUS
  Administered 2021-02-21: 90 mg via INTRAVENOUS

## 2021-02-21 MED ORDER — FENTANYL CITRATE (PF) 100 MCG/2ML IJ SOLN
25.0000 ug | INTRAMUSCULAR | Status: DC | PRN
  Administered 2021-02-21: 25 ug via INTRAVENOUS
  Administered 2021-02-21: 50 ug via INTRAVENOUS
  Administered 2021-02-21: 25 ug via INTRAVENOUS

## 2021-02-21 MED ORDER — ALBUMIN HUMAN 5 % IV SOLN: INTRAVENOUS | Status: DC | PRN

## 2021-02-21 MED ORDER — LABETALOL HCL 5 MG/ML IV SOLN
10.0000 mg | INTRAVENOUS | Status: DC | PRN
Start: 2021-02-21 — End: 2021-02-22
  Administered 2021-02-21 (×2): 10 mg via INTRAVENOUS
  Filled 2021-02-21: qty 4

## 2021-02-21 MED ORDER — HYDRALAZINE HCL 20 MG/ML IJ SOLN
5.0000 mg | INTRAMUSCULAR | Status: DC | PRN
Start: 2021-02-21 — End: 2021-02-22
  Administered 2021-02-21: 5 mg via INTRAVENOUS

## 2021-02-21 MED ORDER — ONDANSETRON HCL 4 MG/2ML IJ SOLN
INTRAMUSCULAR | Status: DC | PRN
  Administered 2021-02-21: 4 mg via INTRAVENOUS

## 2021-02-21 MED ORDER — LIDOCAINE 2% (20 MG/ML) 5 ML SYRINGE
INTRAMUSCULAR | Status: DC | PRN
Start: 2021-02-21 — End: 2021-02-21
  Administered 2021-02-21: 100 mg via INTRAVENOUS

## 2021-02-21 MED ORDER — DEXAMETHASONE SODIUM PHOSPHATE 10 MG/ML IJ SOLN
INTRAMUSCULAR | Status: DC | PRN
  Administered 2021-02-21: 10 mg via INTRAVENOUS

## 2021-02-21 SURGICAL SUPPLY — 59 items
ADH SKN CLS APL DERMABOND .7 (GAUZE/BANDAGES/DRESSINGS) ×1
AGENT HMST KT MTR STRL THRMB (HEMOSTASIS) ×1
BAG COUNTER SPONGE SURGICOUNT (BAG) ×1 IMPLANT
BAG SPNG CNTER NS LX DISP (BAG)
CANISTER SUCT 3000ML PPV (MISCELLANEOUS) ×2 IMPLANT
CATH ROBINSON RED A/P 18FR (CATHETERS) ×2 IMPLANT
CATH SUCT 10FR WHISTLE TIP (CATHETERS) IMPLANT
CLIP TI MEDIUM 6 (CLIP) ×1 IMPLANT
CLIP VESOCCLUDE MED 6/CT (CLIP) ×2 IMPLANT
CLIP VESOCCLUDE SM WIDE 6/CT (CLIP) ×2 IMPLANT
CNTNR URN SCR LID CUP LEK RST (MISCELLANEOUS) ×1 IMPLANT
CONT SPEC 4OZ STRL OR WHT (MISCELLANEOUS) ×2
CORD BIPOLAR FORCEPS 12FT (ELECTRODE) ×1 IMPLANT
COVER PROBE W GEL 5X96 (DRAPES) IMPLANT
DERMABOND ADVANCED (GAUZE/BANDAGES/DRESSINGS) ×1
DERMABOND ADVANCED .7 DNX12 (GAUZE/BANDAGES/DRESSINGS) ×1 IMPLANT
DRAIN CHANNEL 15F RND FF W/TCR (WOUND CARE) IMPLANT
DRAIN JP 10F RND RADIO (DRAIN) ×1 IMPLANT
ELECT REM PT RETURN 9FT ADLT (ELECTROSURGICAL) ×2
ELECTRODE REM PT RTRN 9FT ADLT (ELECTROSURGICAL) ×1 IMPLANT
EVACUATOR SILICONE 100CC (DRAIN) ×1 IMPLANT
FORCEPS BIPOLAR SPETZLER 8 1.0 (NEUROSURGERY SUPPLIES) ×1 IMPLANT
GAUZE 4X4 16PLY ~~LOC~~+RFID DBL (SPONGE) ×1 IMPLANT
GLOVE SRG 8 PF TXTR STRL LF DI (GLOVE) ×1 IMPLANT
GLOVE SURG POLYISO LF SZ7.5 (GLOVE) ×2 IMPLANT
GLOVE SURG UNDER POLY LF SZ8 (GLOVE) ×2
GOWN STRL REUS W/ TWL LRG LVL3 (GOWN DISPOSABLE) ×2 IMPLANT
GOWN STRL REUS W/ TWL XL LVL3 (GOWN DISPOSABLE) ×1 IMPLANT
GOWN STRL REUS W/TWL LRG LVL3 (GOWN DISPOSABLE) ×4
GOWN STRL REUS W/TWL XL LVL3 (GOWN DISPOSABLE) ×4
HEMOSTAT SNOW SURGICEL 2X4 (HEMOSTASIS) IMPLANT
INSERT FOGARTY SM (MISCELLANEOUS) IMPLANT
KIT BASIN OR (CUSTOM PROCEDURE TRAY) ×2 IMPLANT
KIT SHUNT ARGYLE CAROTID ART 6 (VASCULAR PRODUCTS) IMPLANT
KIT TURNOVER KIT B (KITS) ×2 IMPLANT
NDL HYPO 25GX1X1/2 BEV (NEEDLE) IMPLANT
NEEDLE HYPO 25GX1X1/2 BEV (NEEDLE) IMPLANT
NS IRRIG 1000ML POUR BTL (IV SOLUTION) ×4 IMPLANT
PACK CAROTID (CUSTOM PROCEDURE TRAY) ×2 IMPLANT
PAD ARMBOARD 7.5X6 YLW CONV (MISCELLANEOUS) ×4 IMPLANT
POSITIONER HEAD DONUT 9IN (MISCELLANEOUS) ×2 IMPLANT
SET WALTER ACTIVATION W/DRAPE (SET/KITS/TRAYS/PACK) ×1 IMPLANT
SHUNT CAROTID BYPASS 10 (VASCULAR PRODUCTS) IMPLANT
SHUNT CAROTID BYPASS 12FRX15.5 (VASCULAR PRODUCTS) IMPLANT
SPONGE T-LAP 18X18 ~~LOC~~+RFID (SPONGE) ×1 IMPLANT
SURGIFLO W/THROMBIN 8M KIT (HEMOSTASIS) ×1 IMPLANT
SUT CHROMIC 3 0 PS 2 (SUTURE) ×2 IMPLANT
SUT ETHILON 3 0 PS 1 (SUTURE) IMPLANT
SUT PROLENE 6 0 BV (SUTURE) ×2 IMPLANT
SUT PROLENE 7 0 BV 1 (SUTURE) IMPLANT
SUT SILK 2 0 SH (SUTURE) ×1 IMPLANT
SUT SILK 3 0 (SUTURE)
SUT SILK 3-0 18XBRD TIE 12 (SUTURE) IMPLANT
SUT VIC AB 3-0 SH 27 (SUTURE) ×2
SUT VIC AB 3-0 SH 27X BRD (SUTURE) ×2 IMPLANT
SUT VICRYL 4-0 PS2 18IN ABS (SUTURE) ×2 IMPLANT
SYR CONTROL 10ML LL (SYRINGE) IMPLANT
TOWEL GREEN STERILE (TOWEL DISPOSABLE) ×2 IMPLANT
WATER STERILE IRR 1000ML POUR (IV SOLUTION) ×2 IMPLANT

## 2021-02-21 NOTE — Op Note (Signed)
OPERATIVE REPORT  DATE OF SURGERY: 02/21/2021  PATIENT:  Kenneth Rumps Sr.,  40 y.o. male  PRE-OPERATIVE DIAGNOSIS:  LEFT CAROTID BODY PARAGANGLIOMA  POST-OPERATIVE DIAGNOSIS:  LEFT CAROTID BODY PARAGANGLIOMA  PROCEDURE:  Procedure(s): TUMOR EXCISION LEFT CAROTID BODY  SURGEON:  Beckie Salts, MD, Annamarie Major, MD  ASSISTANTS: None   ANESTHESIA:   General   EBL: 50 mL ml  DRAINS: 10 French round JP  LOCAL MEDICATIONS USED:  None  SPECIMEN: Left carotid body paraganglioma  COUNTS:  Correct  PROCEDURE DETAILS: The patient was taken to the operating room and placed on the operating table in the supine position. Following induction of general endotracheal anesthesia, the neck was prepped and draped in a standard fashion.  A curvilinear incision was outlined with a marking pen about 2 fingerbreadths below the angle of the mandible.  #10 scalpel was used to incise the skin and subcutaneous tissue.  Electrocautery was then used for hemostasis and to divide the platysma layer.  A subplatysmal flap was developed superiorly for about 3 cm.  Fibrofatty tissue was dissected off of the upper sternocleidomastoid muscle and reflected anteriorly.  This was then continued with additional superficial fascia that contained the mandibular branch of the facial nerve and was reflected superiorly.  Blunt dissection between the sternocleidomastoid muscle and the submandibular gland revealed the internal jugular vein and then the common carotid artery.  The artery was dissected up superiorly identifying the bifurcation.  The vagus nerve was identified and preserved.  The tumor was identified posterior to the bifurcation.  Multiple feeder vessels were ligated using either 3-0 silk ties or vascular clips.  Bipolar cautery was used for smaller vascular structures.  The external carotid was reflected anteriorly using a vascular loop which facilitated exposure of the tumor and the feeder vessels.  The  entire tumor was removed and sent for pathologic valuation.  Hemostasis was completed.  The hypoglossal nerve was also identified and preserved during the dissection.  Surgi-Flo was applied to the surgical bed.  The drain was exited through separate stab incision secured in place with silk suture.  Platysma layer was reapproximated with interrupted 3-0 chromic.  A running 3-0 chromic subcuticular closure was accomplished and Dermabond was used on the skin.  Patient was then awakened extubated and transferred recovery in stable condition    PATIENT DISPOSITION:  To PACU, stable

## 2021-02-21 NOTE — H&P (Signed)
[facility] [place]   Patient name: Kenneth Creekmore Sr.     MRN: [id number]        DOB: November 10, 1980            Sex: male     REQUESTING PROVIDER:     Dr. Jilda Panda     REASON FOR CONSULT:    Bilateral carotid body tumors   HISTORY OF PRESENT ILLNESS:    Kenneth Davtyan Sr. is a 40 y.o. 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  He is recovering well from this.  There is a significant family history of pheochromocytoma and paraganglioma.  One of his brothers had 2 carotid body tumors and an adrenal tumor addressed.  The other had an adrenal tumor resected.  His family has had genetic testing at [facility].  His father is a carrier.  His testing revealed positivity for SDH D.  As part of his work-up he had carotid CT scan which showed bilateral lesions that were hypermetabolic on PET scan  Prior to his surgical resection he was on alpha blockade and beta-blockade, which have been discontinued.  He carries the diagnosis of hypertension, but is not currently on medications.  He is a former smoker.   PAST MEDICAL HISTORY          Past Medical History:  Diagnosis Date   Family history of pancreatic cancer 09/09/2020   Family history of pheochromocytoma 09/09/2020   Lumbar herniated disc     Monoallelic mutation of SDHD gene 10/01/2020        FAMILY HISTORY         Family History  Problem Relation Age of Onset   Hypertension Father     Pancreatic cancer Maternal Grandmother          dx early 55s   Prostate cancer Paternal Grandfather          d. 51s   Other Half-Brother          paternal; paraganglioma and pheochromocytoma; SDHD mutation    Other Half-Brother          paternal; ? paraganglioma      SOCIAL HISTORY:    Social History         Socioeconomic History   Marital status: Married      Spouse name: Not on file   Number of children: 4   Years of  education: Not on file   Highest education level: Not on file  Occupational History   Occupation: Delivery driver for [employer]  Tobacco Use   Smoking status: Former      Types: Cigarettes      Quit date: 08/2020      Years since quitting: 0.4   Smokeless tobacco: Never  Vaping Use   Vaping Use: Former  Substance and Sexual Activity   Alcohol use: Yes      Comment: occassionally- "once every 2 weeks"   Drug use: No   Sexual activity: Yes      Comment: has taken hydrocodone a few days ago  Other Topics Concern   Not on file  Social History Narrative   Not on file    Social Determinants of Health    Financial Resource Strain: Not on file  Food Insecurity: Not on file  Transportation Needs: Not on file  Physical Activity: Not on file  Stress: Not on file  Social Connections: Not on file  Intimate Partner Violence: Not on file      ALLERGIES:      No Known Allergies   CURRENT MEDICATIONS:            Current Outpatient Medications  Medication Sig Dispense Refill   docusate sodium (COLACE) 100 MG capsule Take 1 capsule (100 mg total) by mouth 2 (two) times daily. 60 capsule 0   methocarbamol (ROBAXIN) 500 MG tablet Take 1 tablet (500 mg total) by mouth every 6 (six) hours as needed for muscle spasms. 20 tablet 0   oxyCODONE (OXY IR/ROXICODONE) 5 MG immediate release tablet Take 1 tablet (5 mg total) by mouth every 4 (four) hours as needed for severe pain. 30 tablet 0   acetaminophen (TYLENOL) 500 MG tablet Take 2 tablets (1,000 mg total) by mouth every 8 (eight) hours as needed for mild pain. (Patient not taking: No sig reported)   0   atenolol (TENORMIN) 25 MG tablet Take 0.5 tablets (12.5 mg total) by mouth daily for 5 days. 3 tablet 0   hydrOXYzine (ATARAX/VISTARIL) 25 MG tablet Take 1 tablet (25 mg total) by mouth every 6 (six) hours. (Patient not taking: No sig reported) 12 tablet 0   omeprazole (PRILOSEC) 40 MG capsule Take 1 capsule (40 mg total) by mouth daily.  (Patient not taking: No sig reported) 30 capsule 1   ondansetron (ZOFRAN-ODT) 4 MG disintegrating tablet Take 1 tablet (4 mg total) by mouth every 6 (six) hours as needed for nausea. (Patient not taking: No sig reported) 20 tablet 0   polyethylene glycol (MIRALAX / GLYCOLAX) 17 g packet Take 17 g by mouth daily as needed (constipation). (Patient not taking: No sig reported) 14 each 0    No current facility-administered medications for this visit.      REVIEW OF SYSTEMS:    [X]  denotes positive finding, [ ]  denotes negative finding Cardiac   Comments:  Chest pain or chest pressure:      Shortness of breath upon exertion:      Short of breath when lying flat:      Irregular heart rhythm:             Vascular      Pain in calf, thigh, or hip brought on by ambulation:      Pain in feet at night that wakes you up from your sleep:       Blood clot in your veins:      Leg swelling:              Pulmonary      Oxygen at home:      Productive cough:       Wheezing:              Neurologic      Sudden weakness in arms or legs:       Sudden numbness in arms or legs:       Sudden onset of difficulty speaking or slurred speech:      Temporary loss of vision in one eye:       Problems with dizziness:              Gastrointestinal      Blood in stool:         Vomited blood:              Genitourinary      Burning when urinating:       Blood in urine:  Psychiatric      Major depression:              Hematologic      Bleeding problems:      Problems with blood clotting too easily:             Skin      Rashes or ulcers:             Constitutional      Fever or chills:        PHYSICAL EXAM:        Vitals:    12/30/20 0913 12/30/20 0915  BP: 114/79 119/80  Pulse: 70 73  Resp: 16    Temp: (!) 97.5 F (36.4 C)    TempSrc: Temporal    SpO2: 98%    Weight: 160 lb (72.6 kg)    Height: 6' (1.829 m)        GENERAL: The patient is a well-nourished male, in no  acute distress. The vital signs are documented above. CARDIAC: There is a regular rate and rhythm.  VASCULAR: Palpable pedal pulses.  Carotid lesions are easily palpable PULMONARY: Nonlabored respirations ABDOMEN: Soft and non-tender midline incision healing MUSCULOSKELETAL: There are no major deformities or cyanosis. NEUROLOGIC: No focal weakness or paresthesias are detected. SKIN: There are no ulcers or rashes noted. PSYCHIATRIC: The patient has a normal affect.   STUDIES:    I have reviewed his neck CT scan with the following findings: 1. Bilateral carotid bifurcation tumors measuring up to 2.7 cm on the right and 3 cm on the left, most consistent with carotid body tumors. 2. Inflammatory paranasal sinus disease. Correlate clinically for acute sinusitis. ASSESSMENT and PLAN    Bilateral carotid body tumor, left greater than right: The patient has recently had a paraganglioma removed off of the left adrenal gland.  He has 2 what appear to be carotid body tumors.  I discussed resecting these in a staged fashion.  I will do this in conjunction with ENT.  I have sent a referral to them.  I am also getting a CT angiogram to better define his arterial anatomy.  I would like for the patient to have preoperative embolization.  We will try to coordinate this within the next month as the patient would like to get it done before the end of September as he is going to see the [employer] [facility] in [place] in October.  We discussed complications of surgery including nerve injury, bleeding, possibility of arterial repair.  He understands this and wishes to proceed.    Underwent successful embolization of the left ECA.  Discussed details of procedure with patient.  All questions answered.  Plan for left carotid body tumor excision.   Leia Alf, MD, FACS [facility] [phone] Pager [phone]

## 2021-02-21 NOTE — Progress Notes (Signed)
Patient arrived to unit from PACU. Vitals obtained and stable. Tele box placed and CCMD called. JP drain to left neck. Left carotid incision clean and dry with skin glue. Patient's wife called. Patient oriented to unit. Menu given and diet advanced. NIH scale obtained. Patient stated left side of tongue numb but able swallow. Stroke education provided. Will continue to monitor.

## 2021-02-21 NOTE — Transfer of Care (Signed)
Immediate Anesthesia Transfer of Care Note  Patient: Kenneth Rumps Sr.  Procedure(s) Performed: TUMOR EXCISION LEFT CAROTID BODY (Left)  Patient Location: PACU  Anesthesia Type:General  Level of Consciousness: awake and alert   Airway & Oxygen Therapy: Patient Spontanous Breathing and Patient connected to face mask oxygen  Post-op Assessment: Report given to RN and Post -op Vital signs reviewed and stable  Post vital signs: Reviewed and stable  Last Vitals:  Vitals Value Taken Time  BP 158/106 02/21/21 0944  Temp    Pulse 95 02/21/21 0947  Resp 19 02/21/21 0947  SpO2 99 % 02/21/21 0947  Vitals shown include unvalidated device data.  Last Pain:  Vitals:   02/21/21 0400  TempSrc: Oral  PainSc: 0-No pain         Complications: No notable events documented.

## 2021-02-21 NOTE — Anesthesia Procedure Notes (Signed)
Procedure Name: Intubation Date/Time: 02/21/2021 7:46 AM Performed by: Erick Colace, CRNA Pre-anesthesia Checklist: Patient identified, Emergency Drugs available, Suction available and Patient being monitored Patient Re-evaluated:Patient Re-evaluated prior to induction Oxygen Delivery Method: Circle system utilized Preoxygenation: Pre-oxygenation with 100% oxygen Induction Type: IV induction Ventilation: Mask ventilation without difficulty Laryngoscope Size: Mac and 4 Grade View: Grade I Tube type: Oral Tube size: 7.5 mm Number of attempts: 1 Airway Equipment and Method: Stylet and Oral airway Placement Confirmation: ETT inserted through vocal cords under direct vision, positive ETCO2 and breath sounds checked- equal and bilateral Secured at: 22 cm Tube secured with: Tape Dental Injury: Teeth and Oropharynx as per pre-operative assessment

## 2021-02-21 NOTE — Interval H&P Note (Signed)
History and Physical Interval Note:  02/21/2021 7:16 AM  Kenneth Virgel Gess Sr.  has presented today for surgery, with the diagnosis of CAROTID BODY PARAGANGLIOMA, left.  The various methods of treatment have been discussed with the patient and family. After consideration of risks, benefits and other options for treatment, the patient has consented to  Procedure(s): TUMOR EXCISION CAROTID BODY (Left) as a surgical intervention.  The patient's history has been reviewed, patient examined, no change in status, stable for surgery.  I have reviewed the patient's chart and labs.  Questions were answered to the patient's satisfaction.     Izora Gala

## 2021-02-21 NOTE — Discharge Instructions (Signed)
You may shower and use soap and water. Do not use any creams, oils or ointment. ° °

## 2021-02-21 NOTE — Anesthesia Postprocedure Evaluation (Signed)
Anesthesia Post Note  Patient: Kenneth Vannice Sr.  Procedure(s) Performed: TUMOR EXCISION LEFT CAROTID BODY (Left)     Patient location during evaluation: PACU Anesthesia Type: General Level of consciousness: sedated Pain management: pain level controlled Vital Signs Assessment: post-procedure vital signs reviewed and stable Respiratory status: spontaneous breathing and respiratory function stable Cardiovascular status: stable Postop Assessment: no apparent nausea or vomiting Anesthetic complications: no   No notable events documented.  Last Vitals:  Vitals:   02/21/21 1030 02/21/21 1045  BP: (!) 168/109 (!) 154/106  Pulse: 87 91  Resp: 18 15  Temp:    SpO2: 100% 100%    Last Pain:  Vitals:   02/21/21 1045  TempSrc:   PainSc: 0-No pain    LLE Motor Response: Purposeful movement;Responds to commands (02/21/21 1045) LLE Sensation: Full sensation (02/21/21 1045) RLE Motor Response: Purposeful movement;Responds to commands (02/21/21 1045) RLE Sensation: Full sensation (02/21/21 1045)      Calton Harshfield DANIEL

## 2021-02-22 ENCOUNTER — Encounter (HOSPITAL_COMMUNITY): Payer: Self-pay | Admitting: Otolaryngology

## 2021-02-22 ENCOUNTER — Other Ambulatory Visit: Payer: Self-pay | Admitting: Vascular Surgery

## 2021-02-22 LAB — BASIC METABOLIC PANEL
Anion gap: 8 (ref 5–15)
BUN: 7 mg/dL (ref 6–20)
CO2: 25 mmol/L (ref 22–32)
Calcium: 9.3 mg/dL (ref 8.9–10.3)
Chloride: 104 mmol/L (ref 98–111)
Creatinine, Ser: 1.21 mg/dL (ref 0.61–1.24)
GFR, Estimated: >60 mL/min (ref >60)
Glucose, Bld: 120 mg/dL — ABNORMAL HIGH (ref 70–99)
Potassium: 3.7 mmol/L (ref 3.5–5.1)
Sodium: 137 mmol/L (ref 135–145)

## 2021-02-22 LAB — LIPID PANEL
Cholesterol: 196 mg/dL (ref 0–200)
HDL: 52 mg/dL (ref >40)
LDL Cholesterol: 111 mg/dL — ABNORMAL HIGH (ref 0–99)
Total CHOL/HDL Ratio: 3.8 RATIO
Triglycerides: 165 mg/dL — ABNORMAL HIGH (ref <150)
VLDL: 33 mg/dL (ref 0–40)

## 2021-02-22 LAB — CBC
HCT: 38.8 % — ABNORMAL LOW (ref 39.0–52.0)
Hemoglobin: 12.2 g/dL — ABNORMAL LOW (ref 13.0–17.0)
MCH: 25.4 pg — ABNORMAL LOW (ref 26.0–34.0)
MCHC: 31.4 g/dL (ref 30.0–36.0)
MCV: 80.8 fL (ref 80.0–100.0)
Platelets: 288 10*3/uL (ref 150–400)
RBC: 4.8 MIL/uL (ref 4.22–5.81)
RDW: 17 % — ABNORMAL HIGH (ref 11.5–15.5)
WBC: 13.4 10*3/uL — ABNORMAL HIGH (ref 4.0–10.5)
nRBC: 0 % (ref 0.0–0.2)

## 2021-02-22 MED ORDER — OXYCODONE HCL 5 MG PO TABS: 5.0000 mg | ORAL_TABLET | ORAL | 0 refills | Status: DC | PRN

## 2021-02-22 MED ORDER — ROSUVASTATIN CALCIUM 40 MG PO TABS: 40.0000 mg | ORAL_TABLET | Freq: Every day | ORAL | 2 refills | Status: DC

## 2021-02-22 NOTE — Progress Notes (Signed)
Subjective: Feels pretty good.  Some throat soreness.  Also, feels numbness above the incision and his tongue does not feel right when he swallows.  Objective: Vital signs in last 24 hours: Temp:  [97.7 F (36.5 C)-98.4 F (36.9 C)] 98 F (36.7 C) (10/22 0734) Pulse Rate:  [74-102] 74 (10/22 0734) Resp:  [14-25] 18 (10/22 0734) BP: (133-168)/(78-111) 140/89 (10/22 0734) SpO2:  [97 %-100 %] 100 % (10/22 0734) Arterial Line BP: (155-192)/(93-142) 186/140 (10/21 1215) Wt Readings from Last 1 Encounters:  02/19/21 73.9 kg    Intake/Output from previous day: 10/21 0701 - 10/22 0700 In: 1889.3 [P.O.:360; I.V.:1279.3; IV Piggyback:250] Out: 1178 [Urine:1100; Drains:28; Blood:50] Intake/Output this shift: No intake/output data recorded.  General appearance: alert, cooperative, and no distress Neck: left incision clean and intact, drain removed, some bleeding from drain site controlled with pressure, no fluid collection, left tongue weakness  Recent Labs    02/21/21 0539 02/22/21 0143  WBC 12.5* 13.4*  HGB 12.8* 12.2*  HCT 40.3 38.8*  PLT 310 288    Recent Labs    02/21/21 0539 02/22/21 0143  NA 138 137  K 3.7 3.7  CL 106 104  CO2 22 25  GLUCOSE 96 120*  BUN 7 7  CREATININE 1.10 1.21  CALCIUM 9.5 9.3    Medications: I have reviewed the patient's current medications.  Assessment/Plan: Left carotid body tumor s/p resection  Drain removed.  OK to discharge home. Left tongue is weak.  Will hopefully improve with time but may affect timing of next surgery.   LOS: 3 days   Melida Quitter 02/22/2021, 9:54 AM

## 2021-02-22 NOTE — Progress Notes (Signed)
Discharged to home with family office visits in place teaching done  

## 2021-02-22 NOTE — Progress Notes (Signed)
PHARMACIST LIPID MONITORING   Kenneth Blackwell is a 40 y.o. male admitted on 02/19/2021 with bilateral carotid body tumor.  Pharmacy has been consulted to optimize lipid-lowering therapy with the indication of secondary prevention for clinical ASCVD.  Recent Labs:  Lipid Panel (last 6 months):   Lab Results  Component Value Date   CHOL 196 02/22/2021   TRIG 165 (H) 02/22/2021   HDL 52 02/22/2021   CHOLHDL 3.8 02/22/2021   VLDL 33 02/22/2021   LDLCALC 111 (H) 02/22/2021    Hepatic function panel (last 6 months):   Lab Results  Component Value Date   AST 19 02/14/2021   ALT 25 02/14/2021   ALKPHOS 70 02/14/2021   BILITOT 0.7 02/14/2021    SCr (since admission):   Serum creatinine: 1.21 mg/dL 02/22/21 0143 Estimated creatinine clearance: 85.7 mL/min  Current therapy and lipid therapy tolerance Current lipid-lowering therapy: None PTA Previous lipid-lowering therapies (if applicable): None Documented or reported allergies or intolerances to lipid-lowering therapies (if applicable): None  Assessment:   Patient agrees with changes to lipid-lowering therapy  Plan:    1.Statin intensity (high intensity recommended for all patients regardless of the LDL):  Add or increase statin to high intensity. Patient to be started on rosuvastatin 40 mg on discharge.  2.Add ezetimibe (if any one of the following):   Not indicated at this time.  3.Refer to lipid clinic:   No  4.Follow-up with:  Primary care provider - Kristen Loader, FNP  5.Follow-up labs after discharge:  Changes in lipid therapy were made. Check a lipid panel in 8-12 weeks then annually.      Zenaida Deed, PharmD PGY1 Acute Care Pharmacy Resident  Phone: [phone] 02/22/2021  11:52 AM  Please check [email or web address] for unit-specific pharmacy phone numbers.

## 2021-02-22 NOTE — Progress Notes (Addendum)
   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 VSS. Sys BP 140-150s. Mild hypoglossal nerve injury. Likely drain out and home later today.   SUBJECTIVE:   Complaining of left jaw and left lateral tongue numbness  PHYSICAL EXAM:   Vitals:   02/21/21 2002 02/21/21 2201 02/21/21 2359 02/22/21 0319  BP: (!) 151/98 (!) 141/86 (!) 150/95 (!) 145/91  Pulse: 87 99 82 82  Resp: 16 17 19 16   Temp: 98.4 F (36.9 C) 97.7 F (36.5 C) 98 F (36.7 C) 98 F (36.7 C)  TempSrc: Oral Oral Oral Oral  SpO2: 99% 99% 99% 98%  Weight:      Height:        General appearance: Awake, alert in no apparent distress Neurologic: Alert and oriented x4, tongue midline, face symmetric, grip strength 5/5 bilaterally Cardiac: Heart rate and rhythm are regular Respirations: Nonlabored Incision: Well approximated without bleeding or hematoma.  Subcutaneous tissue soft to palpation  Drain: 28 cc  LABS:   Lab Results  Component Value Date   WBC 13.4 (H) 02/22/2021   HGB 12.2 (L) 02/22/2021   HCT 38.8 (L) 02/22/2021   MCV 80.8 02/22/2021   PLT 288 02/22/2021   Lab Results  Component Value Date   CREATININE 1.21 02/22/2021   Lab Results  Component Value Date   INR 1.1 02/14/2021   CBG (last 3)  No results for input(s): GLUCAP in the last 72 hours.  PROBLEM LIST:    Active Problems:   Carotid body tumor (HCC)   Carotid body paraganglioma (HCC)   CURRENT MEDS:    Chlorhexidine Gluconate Cloth  6 each Topical Q0600   docusate sodium  100 mg Oral Daily   heparin  5,000 Units Subcutaneous Q8H   pantoprazole  40 mg Oral Daily   potassium chloride  20-40 mEq Oral Once   sodium chloride flush  3 mL Intravenous Q12H   Barbie Banner, PA-C  Office: [phone] 02/22/2021   I have interviewed the patient and examined the patient. I agree with the findings by the PA.  He has some mild deviation of the tongue.  His drain is out.   Plan discharge today.  Gae Gallop, MD

## 2021-02-23 NOTE — Discharge Summary (Signed)
Carotid Discharge Summary     Kenneth Fuster Sr. 1980/10/18 40 y.o. male  [id number]  Admission Date: 02/19/2021  Discharge Date: 02/22/2021  Physician: Dr. Scot Dock  Admission Diagnosis: Carotid body tumor [facility]) [D44.6] Carotid body paraganglioma [facility]) [D44.6]  Discharge Day services:      Hospital Course:  The patient was admitted to the hospital and taken to the operating room on 02/21/2021 and underwent  1) S/P BILATERAL COMMON CAROTID ARTERIOGRAMS,AND LT EXTERNAL ARTERIOGRAM FOLLOWED BY NEAR COMPLETE  SUPERSELECTIVE DEVASCULARIZATION OF  LARGE HYPERVASCULAR LT ICA/ECA CAROTID BODY TUMOR WITH ONYX LIQUID EMBOLIC AND COILS. By S.Deveshwar MD. He was observed overnight in ICU. He remained stable and neurologically intact  2) TUMOR EXCISION LEFT CAROTID BODY on 02/21/2021 by Drs. Trula Slade and Rosen.    The pt tolerated the procedure well and was transported to the PACU in excellent condition.   By POD 1, the pt had jaw  and left tongue numbness. He was otherwise neurologically intact with stable VS. His incision was soft without bleeding. His surgical drain was removed. His pain was controlled.  The remainder of the hospital course consisted of increasing mobilization and increasing intake of solids without difficulty.   Recent Labs    02/21/21 0539 02/22/21 0143  NA 138 137  K 3.7 3.7  CL 106 104  CO2 22 25  GLUCOSE 96 120*  BUN 7 7  CALCIUM 9.5 9.3   Recent Labs    02/21/21 0539 02/22/21 0143  WBC 12.5* 13.4*  HGB 12.8* 12.2*  HCT 40.3 38.8*  PLT 310 288   No results for input(s): INR in the last 72 hours.   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   Retroperitoneal mass 09/24/2020   Family history of pheochromocytoma 09/09/2020   Family history of pancreatic cancer 09/09/2020   Abdominal pain 08/25/2020   Past Medical History:  Diagnosis Date   Family history of pancreatic cancer 09/09/2020   Family history of pheochromocytoma 09/09/2020   Lumbar herniated disc    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   atenolol 25 MG tablet Commonly known as: TENORMIN Take 0.5 tablets (12.5 mg total) by mouth daily for 5 days.   docusate sodium 100 MG capsule Commonly known as: COLACE Take 1 capsule (100 mg total) by mouth 2 (two) times daily.   hydrOXYzine 25 MG tablet Commonly known as: ATARAX/VISTARIL Take 1 tablet (25 mg total) by mouth every 6 (six) hours.   methocarbamol 500 MG tablet Commonly known as: ROBAXIN Take 1 tablet (500 mg total) by mouth every 6 (six) hours as needed for muscle spasms.   omeprazole 40 MG capsule Commonly known as: PRILOSEC Take  1 capsule (40 mg total) by mouth daily.   ondansetron 4 MG disintegrating tablet Commonly known as: ZOFRAN-ODT Take 1 tablet (4 mg total) by mouth every 6 (six) hours as needed for nausea.   oxyCODONE 5 MG immediate release tablet Commonly known as: Oxy IR/ROXICODONE Take 1 tablet (5 mg total) by mouth every 4 (four) hours as needed for moderate pain.   polyethylene glycol 17 g packet Commonly known as: MIRALAX / GLYCOLAX Take 17 g by mouth daily as needed (constipation).   rosuvastatin 40 MG  tablet Commonly known as: CRESTOR Take 1 tablet (40 mg total) by mouth daily.         Discharge Instructions:   [facility] Discharge Instructions Carotid Endarterectomy (CEA)  Please refer to the following instructions for your post-procedure care. Your surgeon or physician assistant will discuss any changes with you.  Activity  You are encouraged to walk as much as you can. You can slowly return to normal activities but must avoid strenuous activity and heavy lifting until your doctor tell you it's OK. Avoid activities such as vacuuming or swinging a golf club. You can drive after one week if you are comfortable and you are no longer taking prescription pain medications. It is normal to feel tired for serval weeks after your surgery. It is also normal to have difficulty with sleep habits, eating, and bowel movements after surgery. These will go away with time.  Bathing/Showering  You may shower after you come home. Do not soak in a bathtub, hot tub, or swim until the incision heals completely.  Incision Care  Shower every day. Clean your incision with mild soap and water. Pat the area dry with a clean towel. You do not need a bandage unless otherwise instructed. Do not apply any ointments or creams to your incision. You may have skin glue on your incision. Do not peel it off. It will come off on its own in about one week. Your incision may feel thickened and raised for several weeks after your surgery. This is normal and the skin will soften over time. For Men Only: It's OK to shave around the incision but do not shave the incision itself for 2 weeks. It is common to have numbness under your chin that could last for several months.  Diet  Resume your normal diet. There are no special food restrictions following this procedure. A low fat/low cholesterol diet is recommended for all patients with vascular disease. In order to heal from your surgery, it is  CRITICAL to get adequate nutrition. Your body requires vitamins, minerals, and protein. Vegetables are the best source of vitamins and minerals. Vegetables also provide the perfect balance of protein. Processed food has little nutritional value, so try to avoid this.  Medications  Resume taking all of your medications unless your doctor or physician assistant tells you not to.  If your incision is causing pain, you may take over-the- counter pain relievers such as acetaminophen (Tylenol). If you were prescribed a stronger pain medication, please be aware these medications can cause nausea and constipation.  Prevent nausea by taking the medication with a snack or meal. Avoid constipation by drinking plenty of fluids and eating foods with a high amount of fiber, such as fruits, vegetables, and grains. Do not take Tylenol if you are taking prescription pain medications.  Follow Up  Our office will schedule a follow up appointment 2-3 weeks following discharge.  Please call us immediately for  any of the following conditions  Increased pain, redness, drainage (pus) from your incision site. Fever of 101 degrees or higher. If you should develop stroke (slurred speech, difficulty swallowing, weakness on one side of your body, loss of vision) you should call 911 and go to the nearest emergency room.  Reduce your risk of vascular disease:  Stop smoking. If you would like help call [facility] at 1-800-QUIT-NOW 832-810-3335) or [place] at [phone]. Manage your cholesterol Maintain a desired weight Control your diabetes Keep your blood pressure down  If you have any questions, please call the office at [phone].  Prescriptions given: Percocet #15 No Refill  Disposition: Home  Patient's condition: is Excellent  Follow up: 1. Dr. Trula Slade in 2 weeks.   Barbie Banner PA-C [facility] [phone]   --- For [facility] Registry use ---   Modified Rankin score at  D/C (0-6): 0  IV medication needed for:  1. Hypertension: No 2. Hypotension: No  Post-op Complications: No  1. Post-op CVA or TIA: No  If yes: Event classification (right eye, left eye, right cortical, left cortical, verterobasilar, other):   If yes: Timing of event (intra-op, <6 hrs post-op, >=6 hrs post-op, unknown):   2. CN injury: No  If yes: CN not injuried   3. Myocardial infarction: No  If yes: Dx by (EKG or clinical, Troponin):   4.  CHF: No  5.  Dysrhythmia (new): No  6. Wound infection: No  7. Reperfusion symptoms: No  8. Return to OR: No  If yes: return to OR for (bleeding, neurologic, other CEA incision, other):   Discharge medications: Statin use:  Yes ASA use:  No   Beta blocker use:  Yes ACE-Inhibitor use:  No  ARB use:  No CCB use: No P2Y12 Antagonist use: No, [ ]  Plavix, [ ]  Plasugrel, [ ]  Ticlopinine, [ ]  Ticagrelor, [ ]  Other, [ ]  No for medical reason, [ ]  Non-compliant, [ ]  Not-indicated Anti-coagulant use:  No, [ ]  Warfarin, [ ]  Rivaroxaban, [ ]  Dabigatran,

## 2021-02-25 LAB — SURGICAL PATHOLOGY

## 2021-02-26 ENCOUNTER — Other Ambulatory Visit (HOSPITAL_COMMUNITY): Payer: Self-pay | Admitting: Interventional Radiology

## 2021-02-26 DIAGNOSIS — D446 Neoplasm of uncertain behavior of carotid body: Secondary | ICD-10-CM

## 2021-03-03 ENCOUNTER — Other Ambulatory Visit: Payer: Self-pay

## 2021-03-03 ENCOUNTER — Encounter: Payer: Self-pay | Admitting: Physician Assistant

## 2021-03-03 ENCOUNTER — Ambulatory Visit (INDEPENDENT_AMBULATORY_CARE_PROVIDER_SITE_OTHER): Payer: BC Managed Care – PPO | Admitting: Physician Assistant

## 2021-03-03 VITALS — BP 137/94 | HR 92 | Temp 97.7°F | Resp 20 | Ht 72.0 in | Wt 166.7 lb

## 2021-03-03 DIAGNOSIS — D446 Neoplasm of uncertain behavior of carotid body: Secondary | ICD-10-CM

## 2021-03-03 NOTE — Progress Notes (Signed)
POST OPERATIVE OFFICE NOTE    CC:  F/u for surgery  HPI:  This is a 40 y.o. male who is s/p tumor excision of left carotid body on 02/21/2021 by Dr. Constance Holster and Dr. Trula Slade.  Pt for excision of right carotid body tumor on 03/07/2021.  Pt returns today for follow up and here with his wife.  Pt states after surgery, he felt like his tongue didn't work right and felt swollen.  He didn't have trouble actually swallowing but had difficulty with chewing his food.  He states it is still mildly present but this has progressively gotten better.  He had complaints of his ear canal feeling full and he talked with Dr. Scot Dock over the weekend and was told to try Ibuprofen and this did help.  He states that he did have a headache and this has also gotten better.  He does have some numbness around the incision and jaw line. He is scheduled for embolization on Thursday and removal of carotid body tumor on the right on Friday.    No Known Allergies  Current Outpatient Medications  Medication Sig Dispense Refill   acetaminophen (TYLENOL) 500 MG tablet Take 2 tablets (1,000 mg total) by mouth every 8 (eight) hours as needed for mild pain. (Patient not taking: No sig reported)  0   atenolol (TENORMIN) 25 MG tablet Take 0.5 tablets (12.5 mg total) by mouth daily for 5 days. (Patient not taking: No sig reported) 3 tablet 0   docusate sodium (COLACE) 100 MG capsule Take 1 capsule (100 mg total) by mouth 2 (two) times daily. (Patient not taking: No sig reported) 60 capsule 0   hydrOXYzine (ATARAX/VISTARIL) 25 MG tablet Take 1 tablet (25 mg total) by mouth every 6 (six) hours. (Patient not taking: No sig reported) 12 tablet 0   methocarbamol (ROBAXIN) 500 MG tablet Take 1 tablet (500 mg total) by mouth every 6 (six) hours as needed for muscle spasms. (Patient not taking: No sig reported) 20 tablet 0   omeprazole (PRILOSEC) 40 MG capsule Take 1 capsule (40 mg total) by mouth daily. (Patient not taking: No sig reported) 30  capsule 1   ondansetron (ZOFRAN-ODT) 4 MG disintegrating tablet Take 1 tablet (4 mg total) by mouth every 6 (six) hours as needed for nausea. (Patient not taking: No sig reported) 20 tablet 0   oxyCODONE (OXY IR/ROXICODONE) 5 MG immediate release tablet Take 1 tablet (5 mg total) by mouth every 4 (four) hours as needed for moderate pain. 20 tablet 0   polyethylene glycol (MIRALAX / GLYCOLAX) 17 g packet Take 17 g by mouth daily as needed (constipation). (Patient not taking: No sig reported) 14 each 0   rosuvastatin (CRESTOR) 40 MG tablet Take 1 tablet (40 mg total) by mouth daily. 30 tablet 2   No current facility-administered medications for this visit.     ROS:  See HPI  Physical Exam:  Today's Vitals   03/03/21 0855 03/03/21 0857  BP: (!) 146/96 (!) 137/94  Pulse: 92   Resp: 20   Temp: 97.7 F (36.5 C)   TempSrc: Temporal   SpO2: 100%   Weight: 166 lb 11.2 oz (75.6 kg)   Height: 6' (1.829 m)   PainSc: 6    PainLoc: Head    Body mass index is 22.61 kg/m.   Incision:  healed nicely Extremities:  moving all equally Neuro: very slight tongue deviation to the left    Assessment/Plan:  This is a 40 y.o. male  who is s/p: Tumor excision of left carotid body on 02/21/2021 by Dr. Constance Holster and Dr. Trula Slade  -pt had issues post operatively with eating due to tongue swelling/deviation.  This has greatly improved but not completely resolved.  Dr Trula Slade discussed with pt and his wife that the tumor was around the hypoglossal nerve and he is not surprised by this and very encouraged that this will continue to improve given improvement thus far.  Discussed with pt that the numbness around the incision and jaw are not uncommon and should improve over time.   -Dr. Trula Slade had discussion with pt and his wife about upcoming surgery on Friday.  She had concerns about his swallowing and possible airway obstruction if he has the same issues with upcoming surgery.  Dr. Trula Slade discussed with her that  there is a possibility of this, he felt it was low possibility and he did not have any hesitations with proceeding on Friday, but if pt and wife did, we could postpone for a month or so.  Pt wishes to proceed this week.   -pt encouraged to use Tylenol for headache.  -continue statin   Leontine Locket, [facility] [phone]   Clinic MD:  pt seen with Dr. Trula Slade

## 2021-03-04 ENCOUNTER — Other Ambulatory Visit: Payer: Self-pay

## 2021-03-05 ENCOUNTER — Encounter (HOSPITAL_COMMUNITY): Payer: Self-pay | Admitting: Interventional Radiology

## 2021-03-05 ENCOUNTER — Other Ambulatory Visit: Payer: Self-pay | Admitting: Radiology

## 2021-03-05 LAB — SARS CORONAVIRUS 2 (TAT 6-24 HRS): SARS Coronavirus 2: NEGATIVE

## 2021-03-05 NOTE — Progress Notes (Signed)
Anesthesia Chart Review:  Case: [id number] Date/Time: 03/06/21 0815   Procedure: Right carotid body tumor embolization   Anesthesia type: General   Pre-op diagnosis: Carotid body tumor (D44.6)   Location: [facility] OR ROOM 11 / [place] OR   Surgeons: Luanne Bras, MD       DISCUSSION: Patient is a 40 year old male scheduled for the above procedure.  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  S/p extra-adrenal paraganglioma resection 12/16/20. He is also scheduled for excision of right carotid body tumor on 03/07/21. He underwent embolization procedure and excision of left carotid body paraganglioma last month. He did have some post-operative tongue deviation with some difficulty with chewing which was attributed to intraoperative manipulation of the hypoglossal nerve.  He is also scheduled for right carotid body tumor resection on 03/07/2021 by Izora Gala, MD.  History includes former smoker (quit 08/02/20), bilateral carotid body tumor (s/p embolization of left carotid body tumor with Onyx 18 and coils achieving ~ 85-90% devascularization 02/19/21; s/p resection of left carotid body paraganglioma 02/21/21), extra-adrenal paraganglioma (with elevated metanephrines & catecholamines, s/p open excision of retroperitoneal mass 12/16/20), ORIF right femoral neck fracture (04/10/06).  He has known family history of pheochromocytoma and paraganglioma, and patient reportedly positive for SDHD mutation by genetic testing.   Preprocedure COVID-19 test negative on 03/04/2021.  Anesthesia team to evaluate on the day of surgery.  Currently last lab results are from 02/22/21 with day of surgery labs entered by IR.   VS:  Wt Readings from Last 3 Encounters:  03/03/21 75.6 kg  02/19/21 73.9 kg  02/14/21 75.8 kg   Temp Readings from Last 3 Encounters:  03/03/21 36.5 C (Temporal)  02/22/21 36.8 C (Oral)  02/14/21 36.7 C (Oral)   BP Readings from Last 3 Encounters:  03/03/21 (!) 137/94   02/22/21 (!) 139/96  02/14/21 126/80   Pulse Readings from Last 3 Encounters:  03/03/21 92  02/22/21 82  02/14/21 64     PROVIDERS: Kristen Loader, FNP Trula Slade, V. Rock Nephew, MD is vascular surgeon Izora Gala, MD is ENT Michaelle Birks, MD is general surgeon   LABS: Currently, last available lab results include: Lab Results  Component Value Date   WBC 13.4 (H) 02/22/2021   HGB 12.2 (L) 02/22/2021   HCT 38.8 (L) 02/22/2021   PLT 288 02/22/2021   GLUCOSE 120 (H) 02/22/2021   ALT 25 02/14/2021   AST 19 02/14/2021   NA 137 02/22/2021   K 3.7 02/22/2021   CL 104 02/22/2021   CREATININE 1.21 02/22/2021   BUN 7 02/22/2021   CO2 25 02/22/2021   INR 1.1 02/14/2021  Post-paragangliomas resection total catecholamines 635 (down from 1925 preoperatively) and total metanephrine 222 (down from 674 preoperatively).   IMAGES: CTA Chest 01/03/21: IMPRESSION: 1. No evidence of acute pulmonary embolus or arterial abnormality. 2. Several evolving ground-glass attenuation airspace opacities identified in the upper lobes. The dominant opacity in the right upper lobe seen on the recent prior CT scan of the neck from 12/31/2020 has nearly completely resolved. The relatively rapidly changing appearance of these nodules is highly consistent with an ongoing infectious/inflammatory process. Consider 1 additional follow-up CT scan of the chest without intravenous contrast in 6 weeks following an appropriate course of antimicrobial therapy. 3. Mild left apical paraseptal emphysema. 4. Incidental note made of the lateral segmental branch of the left hepatic artery replaced to the left gastric artery. This is an anatomical variant of no clinical significance. - Emphysema (  ICD10-J43.9).   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    Past Surgical History:  Procedure Laterality Date   CAROTID BODY TUMOR EXCISION Left 02/21/2021   Procedure: TUMOR EXCISION LEFT CAROTID BODY;  Surgeon: Izora Gala, MD;  Location: [place];  Service: ENT;  Laterality: Left;   HIP SURGERY     IR ANGIO INTRA EXTRACRAN SEL COM CAROTID INNOMINATE BILAT MOD SED  02/19/2021   IR ANGIOGRAM FOLLOW UP STUDY  02/19/2021   IR ANGIOGRAM FOLLOW UP STUDY  02/19/2021   IR ANGIOGRAM FOLLOW UP STUDY  02/19/2021   IR TRANSCATH/EMBOLIZ  02/19/2021   RADIOLOGY WITH ANESTHESIA N/A 02/19/2021   Procedure: EMBOLIZATION;  Surgeon: Luanne Bras, MD;  Location: [place];  Service: Radiology;  Laterality: N/A;   RESECTION OF RETROPERITONEAL MASS N/A 12/16/2020   Procedure: EXCISION OF RETROPERITONEAL MASS PHEOCHROMOCYTOMA;  Surgeon: Dwan Bolt, MD;  Location: [place];  Service: General;  Laterality: N/A;    MEDICATIONS: No current facility-administered medications for this encounter.    acetaminophen (TYLENOL) 500 MG tablet   docusate sodium (COLACE) 100 MG capsule   ibuprofen (ADVIL) 800 MG tablet   methocarbamol (ROBAXIN) 500 MG tablet   omeprazole (PRILOSEC) 40 MG capsule   oxyCODONE (OXY IR/ROXICODONE) 5 MG immediate release tablet   polyethylene glycol (MIRALAX / GLYCOLAX) 17 g packet   rosuvastatin (CRESTOR) 40 MG tablet    Myra Gianotti, PA-C Surgical Short Stay/Anesthesiology [facility] Phone [phone] [facility] Phone [phone] 03/05/2021 12:06 PM

## 2021-03-05 NOTE — Progress Notes (Signed)
EKG: 08/25/20 CXR: na ECHO: denies Stress Test: denies Cardiac Cath: denies  Fasting Blood Sugar- na Checks Blood Sugar_ na__ times a day  OSA/CPAP: No  ASA/Blood Thinner: No  Covid test 03/04/21 negative  Anesthesia Review: Yes, per order  Patient denies shortness of breath, fever, cough, and chest pain at PAT appointment.  Patient verbalized understanding of instructions provided today at the PAT appointment.  Patient asked to review instructions at home and day of surgery.

## 2021-03-05 NOTE — Anesthesia Preprocedure Evaluation (Addendum)
Anesthesia Evaluation  Patient identified by MRN, date of birth, ID band Patient awake    Reviewed: Allergy & Precautions, NPO status , Patient's Chart, lab work & pertinent test results  Airway Mallampati: II  TM Distance: >3 FB Neck ROM: Full    Dental no notable dental hx.    Pulmonary former smoker,    Pulmonary exam normal breath sounds clear to auscultation       Cardiovascular negative cardio ROS Normal cardiovascular exam Rhythm:Regular Rate:Normal     Neuro/Psych negative neurological ROS  negative psych ROS   GI/Hepatic negative GI ROS, Neg liver ROS,   Endo/Other  negative endocrine ROS  Renal/GU negative Renal ROS     Musculoskeletal negative musculoskeletal ROS (+)   Abdominal   Peds  Hematology  (+) anemia ,   Anesthesia Other Findings Carotid body tumor   Reproductive/Obstetrics                           Anesthesia Physical Anesthesia Plan  ASA: 2  Anesthesia Plan: General   Post-op Pain Management:    Induction: Intravenous  PONV Risk Score and Plan: 2 and Ondansetron, Dexamethasone and Treatment may vary due to age or medical condition  Airway Management Planned: Oral ETT  Additional Equipment: Arterial line  Intra-op Plan:   Post-operative Plan: Extubation in OR  Informed Consent: I have reviewed the patients History and Physical, chart, labs and discussed the procedure including the risks, benefits and alternatives for the proposed anesthesia with the patient or authorized representative who has indicated his/her understanding and acceptance.     Dental advisory given  Plan Discussed with: CRNA  Anesthesia Plan Comments: (PAT note written 03/05/2021 by Myra Gianotti, PA-C. )      Anesthesia Quick Evaluation

## 2021-03-06 ENCOUNTER — Inpatient Hospital Stay (HOSPITAL_COMMUNITY): Payer: BC Managed Care – PPO | Admitting: Vascular Surgery

## 2021-03-06 ENCOUNTER — Encounter (HOSPITAL_COMMUNITY): Payer: Self-pay

## 2021-03-06 ENCOUNTER — Encounter (HOSPITAL_COMMUNITY)
Admission: RE | Disposition: A | Discharge status: Home / Self Care | Payer: Self-pay | Source: Home / Self Care | Attending: Interventional Radiology

## 2021-03-06 ENCOUNTER — Encounter (HOSPITAL_COMMUNITY): Payer: Self-pay | Admitting: Interventional Radiology

## 2021-03-06 ENCOUNTER — Inpatient Hospital Stay (HOSPITAL_COMMUNITY)
Admission: RE | Admit: 2021-03-06 | Discharge: 2021-03-06 | Disposition: A | Discharge status: Home / Self Care | Payer: BC Managed Care – PPO | Source: Ambulatory Visit | Attending: Interventional Radiology | Admitting: Interventional Radiology

## 2021-03-06 ENCOUNTER — Inpatient Hospital Stay (HOSPITAL_COMMUNITY)
Admission: RE | Admit: 2021-03-06 | Discharge: 2021-03-08 | DRG: 026 | Disposition: A | Discharge status: Home / Self Care | Payer: BC Managed Care – PPO | Attending: Surgery | Admitting: Surgery

## 2021-03-06 DIAGNOSIS — Z87891 Personal history of nicotine dependence: Secondary | ICD-10-CM

## 2021-03-06 DIAGNOSIS — D446 Neoplasm of uncertain behavior of carotid body: Secondary | ICD-10-CM

## 2021-03-06 DIAGNOSIS — Z79899 Other long term (current) drug therapy: Secondary | ICD-10-CM

## 2021-03-06 DIAGNOSIS — Z8249 Family history of ischemic heart disease and other diseases of the circulatory system: Secondary | ICD-10-CM

## 2021-03-06 DIAGNOSIS — G8929 Other chronic pain: Secondary | ICD-10-CM | POA: Diagnosis present

## 2021-03-06 DIAGNOSIS — Z8042 Family history of malignant neoplasm of prostate: Secondary | ICD-10-CM

## 2021-03-06 DIAGNOSIS — Z800 Family history of malignant neoplasm of digestive organs: Secondary | ICD-10-CM | POA: Diagnosis not present

## 2021-03-06 DIAGNOSIS — K148 Other diseases of tongue: Secondary | ICD-10-CM | POA: Diagnosis present

## 2021-03-06 DIAGNOSIS — D447 Neoplasm of uncertain behavior of aortic body and other paraganglia: Secondary | ICD-10-CM | POA: Diagnosis present

## 2021-03-06 DIAGNOSIS — M5126 Other intervertebral disc displacement, lumbar region: Secondary | ICD-10-CM | POA: Diagnosis present

## 2021-03-06 DIAGNOSIS — Z20822 Contact with and (suspected) exposure to covid-19: Secondary | ICD-10-CM | POA: Diagnosis present

## 2021-03-06 DIAGNOSIS — I10 Essential (primary) hypertension: Secondary | ICD-10-CM | POA: Diagnosis not present

## 2021-03-06 DIAGNOSIS — R519 Headache, unspecified: Secondary | ICD-10-CM | POA: Diagnosis not present

## 2021-03-06 DIAGNOSIS — D649 Anemia, unspecified: Secondary | ICD-10-CM | POA: Diagnosis present

## 2021-03-06 DIAGNOSIS — S15009A Unspecified injury of unspecified carotid artery, initial encounter: Secondary | ICD-10-CM | POA: Diagnosis present

## 2021-03-06 HISTORY — PX: RADIOLOGY WITH ANESTHESIA: SHX6223

## 2021-03-06 HISTORY — PX: IR ANGIO EXTERNAL CAROTID SEL EXT CAROTID UNI R MOD SED: IMG5371

## 2021-03-06 HISTORY — PX: IR ANGIO INTRA EXTRACRAN SEL COM CAROTID INNOMINATE UNI R MOD SED: IMG5359

## 2021-03-06 HISTORY — PX: IR TRANSCATH/EMBOLIZ: IMG695

## 2021-03-06 HISTORY — PX: IR ANGIOGRAM FOLLOW UP STUDY: IMG697

## 2021-03-06 LAB — COMPREHENSIVE METABOLIC PANEL
ALT: 45 U/L — ABNORMAL HIGH (ref 0–44)
AST: 30 U/L (ref 15–41)
Albumin: 3.8 g/dL (ref 3.5–5.0)
Alkaline Phosphatase: 67 U/L (ref 38–126)
Anion gap: 7 (ref 5–15)
BUN: 5 mg/dL — ABNORMAL LOW (ref 6–20)
CO2: 24 mmol/L (ref 22–32)
Calcium: 9.3 mg/dL (ref 8.9–10.3)
Chloride: 109 mmol/L (ref 98–111)
Creatinine, Ser: 1.2 mg/dL (ref 0.61–1.24)
GFR, Estimated: >60 mL/min (ref >60)
Glucose, Bld: 162 mg/dL — ABNORMAL HIGH (ref 70–99)
Potassium: 3.9 mmol/L (ref 3.5–5.1)
Sodium: 140 mmol/L (ref 135–145)
Total Bilirubin: 0.6 mg/dL (ref 0.3–1.2)
Total Protein: 6.9 g/dL (ref 6.5–8.1)

## 2021-03-06 LAB — POCT ACTIVATED CLOTTING TIME: Activated Clotting Time: 173 seconds

## 2021-03-06 LAB — CBC WITH DIFFERENTIAL/PLATELET
Abs Immature Granulocytes: 0.02 10*3/uL (ref 0.00–0.07)
Basophils Absolute: 0.1 10*3/uL (ref 0.0–0.1)
Basophils Relative: 1 %
Eosinophils Absolute: 0.5 10*3/uL (ref 0.0–0.5)
Eosinophils Relative: 7 %
HCT: 42.3 % (ref 39.0–52.0)
Hemoglobin: 13.3 g/dL (ref 13.0–17.0)
Immature Granulocytes: 0 %
Lymphocytes Relative: 38 %
Lymphs Abs: 3.1 10*3/uL (ref 0.7–4.0)
MCH: 26 pg (ref 26.0–34.0)
MCHC: 31.4 g/dL (ref 30.0–36.0)
MCV: 82.6 fL (ref 80.0–100.0)
Monocytes Absolute: 0.3 10*3/uL (ref 0.1–1.0)
Monocytes Relative: 4 %
Neutro Abs: 4 10*3/uL (ref 1.7–7.7)
Neutrophils Relative %: 50 %
Platelets: 315 10*3/uL (ref 150–400)
RBC: 5.12 MIL/uL (ref 4.22–5.81)
RDW: 16.5 % — ABNORMAL HIGH (ref 11.5–15.5)
WBC: 8 10*3/uL (ref 4.0–10.5)
nRBC: 0 % (ref 0.0–0.2)

## 2021-03-06 LAB — PROTIME-INR
INR: 1 (ref 0.8–1.2)
Prothrombin Time: 13.1 seconds (ref 11.4–15.2)

## 2021-03-06 LAB — CBC
HCT: 38.3 % — ABNORMAL LOW (ref 39.0–52.0)
Hemoglobin: 12.4 g/dL — ABNORMAL LOW (ref 13.0–17.0)
MCH: 25.7 pg — ABNORMAL LOW (ref 26.0–34.0)
MCHC: 32.4 g/dL (ref 30.0–36.0)
MCV: 79.5 fL — ABNORMAL LOW (ref 80.0–100.0)
Platelets: 313 10*3/uL (ref 150–400)
RBC: 4.82 MIL/uL (ref 4.22–5.81)
RDW: 16.4 % — ABNORMAL HIGH (ref 11.5–15.5)
WBC: 14.8 10*3/uL — ABNORMAL HIGH (ref 4.0–10.5)
nRBC: 0 % (ref 0.0–0.2)

## 2021-03-06 LAB — BASIC METABOLIC PANEL
Anion gap: 9 (ref 5–15)
BUN: 8 mg/dL (ref 6–20)
CO2: 24 mmol/L (ref 22–32)
Calcium: 9.5 mg/dL (ref 8.9–10.3)
Chloride: 106 mmol/L (ref 98–111)
Creatinine, Ser: 1.25 mg/dL — ABNORMAL HIGH (ref 0.61–1.24)
GFR, Estimated: >60 mL/min (ref >60)
Glucose, Bld: 91 mg/dL (ref 70–99)
Potassium: 4.1 mmol/L (ref 3.5–5.1)
Sodium: 139 mmol/L (ref 135–145)

## 2021-03-06 LAB — URINALYSIS, ROUTINE W REFLEX MICROSCOPIC
Bilirubin Urine: NEGATIVE
Glucose, UA: NEGATIVE mg/dL
Hgb urine dipstick: NEGATIVE
Ketones, ur: NEGATIVE mg/dL
Leukocytes,Ua: NEGATIVE
Nitrite: NEGATIVE
Protein, ur: NEGATIVE mg/dL
Specific Gravity, Urine: 1.024 (ref 1.005–1.030)
pH: 5 (ref 5.0–8.0)

## 2021-03-06 LAB — TYPE AND SCREEN
ABO/RH(D): O POS
Antibody Screen: NEGATIVE

## 2021-03-06 SURGERY — IR WITH ANESTHESIA
Anesthesia: General

## 2021-03-06 MED ORDER — NITROGLYCERIN 1 MG/10 ML FOR IR/CATH LAB
INTRA_ARTERIAL | Status: AC
  Filled 2021-03-06: qty 10

## 2021-03-06 MED ORDER — PHENYLEPHRINE 40 MCG/ML (10ML) SYRINGE FOR IV PUSH (FOR BLOOD PRESSURE SUPPORT)
PREFILLED_SYRINGE | INTRAVENOUS | Status: DC | PRN
  Administered 2021-03-06: 40 ug via INTRAVENOUS
  Administered 2021-03-06: 80 ug via INTRAVENOUS
  Administered 2021-03-06 (×4): 40 ug via INTRAVENOUS
  Administered 2021-03-06: 80 ug via INTRAVENOUS

## 2021-03-06 MED ORDER — SODIUM CHLORIDE 0.9 % IV SOLN: INTRAVENOUS | Status: DC

## 2021-03-06 MED ORDER — SUGAMMADEX SODIUM 200 MG/2ML IV SOLN
INTRAVENOUS | Status: DC | PRN
  Administered 2021-03-06 (×2): 100 mg via INTRAVENOUS

## 2021-03-06 MED ORDER — CLEVIDIPINE BUTYRATE 0.5 MG/ML IV EMUL
0.0000 mg/h | INTRAVENOUS | Status: DC
  Administered 2021-03-06: 9 mg/h via INTRAVENOUS
  Administered 2021-03-06: 4 mg/h via INTRAVENOUS
  Administered 2021-03-06: 5 mg/h via INTRAVENOUS
  Administered 2021-03-06: 9 mg/h via INTRAVENOUS
  Administered 2021-03-07: 4 mg/h via INTRAVENOUS
  Filled 2021-03-06 (×5): qty 50

## 2021-03-06 MED ORDER — CEFAZOLIN SODIUM-DEXTROSE 2-4 GM/100ML-% IV SOLN
2.0000 g | INTRAVENOUS | Status: AC
  Administered 2021-03-07: 2 g via INTRAVENOUS
  Filled 2021-03-06 (×2): qty 100

## 2021-03-06 MED ORDER — ASPIRIN EC 325 MG PO TBEC
DELAYED_RELEASE_TABLET | ORAL | Status: AC
  Administered 2021-03-06: 325 mg via ORAL
  Filled 2021-03-06: qty 1

## 2021-03-06 MED ORDER — PHENYLEPHRINE HCL-NACL 20-0.9 MG/250ML-% IV SOLN
INTRAVENOUS | Status: DC | PRN
  Administered 2021-03-06: 25 ug/min via INTRAVENOUS

## 2021-03-06 MED ORDER — LACTATED RINGERS IV SOLN: INTRAVENOUS | Status: DC

## 2021-03-06 MED ORDER — CELECOXIB 200 MG PO CAPS
ORAL_CAPSULE | ORAL | Status: AC
  Filled 2021-03-06: qty 1

## 2021-03-06 MED ORDER — ACETAMINOPHEN 500 MG PO TABS
1000.0000 mg | ORAL_TABLET | Freq: Once | ORAL | Status: AC
  Administered 2021-03-06: 1000 mg via ORAL

## 2021-03-06 MED ORDER — IOHEXOL 300 MG/ML  SOLN
100.0000 mL | Freq: Once | INTRAMUSCULAR | Status: AC | PRN
  Administered 2021-03-06: 25 mL via INTRA_ARTERIAL

## 2021-03-06 MED ORDER — CHLORHEXIDINE GLUCONATE 0.12 % MT SOLN
OROMUCOSAL | Status: AC
  Filled 2021-03-06: qty 15

## 2021-03-06 MED ORDER — CEFAZOLIN SODIUM-DEXTROSE 2-4 GM/100ML-% IV SOLN
INTRAVENOUS | Status: AC
  Filled 2021-03-06: qty 100

## 2021-03-06 MED ORDER — DEXAMETHASONE SODIUM PHOSPHATE 10 MG/ML IJ SOLN
INTRAMUSCULAR | Status: DC | PRN
  Administered 2021-03-06: 4 mg via INTRAVENOUS

## 2021-03-06 MED ORDER — CELECOXIB 200 MG PO CAPS
200.0000 mg | ORAL_CAPSULE | Freq: Once | ORAL | Status: AC
  Administered 2021-03-06: 200 mg via ORAL

## 2021-03-06 MED ORDER — ACETAMINOPHEN 160 MG/5ML PO SOLN: 650.0000 mg | ORAL | Status: DC | PRN

## 2021-03-06 MED ORDER — PROPOFOL 10 MG/ML IV BOLUS
INTRAVENOUS | Status: DC | PRN
  Administered 2021-03-06: 170 mg via INTRAVENOUS

## 2021-03-06 MED ORDER — LACTATED RINGERS IV SOLN: INTRAVENOUS | Status: DC | PRN

## 2021-03-06 MED ORDER — ACETAMINOPHEN 500 MG PO TABS
ORAL_TABLET | ORAL | Status: AC
  Filled 2021-03-06: qty 2

## 2021-03-06 MED ORDER — ACETAMINOPHEN 325 MG PO TABS: 650.0000 mg | ORAL_TABLET | ORAL | Status: DC | PRN

## 2021-03-06 MED ORDER — CLEVIDIPINE BUTYRATE 0.5 MG/ML IV EMUL
INTRAVENOUS | Status: AC
  Filled 2021-03-06: qty 50

## 2021-03-06 MED ORDER — CEFAZOLIN SODIUM-DEXTROSE 1-4 GM/50ML-% IV SOLN: 1.0000 g | INTRAVENOUS | Status: DC

## 2021-03-06 MED ORDER — ACETAMINOPHEN 650 MG RE SUPP: 650.0000 mg | RECTAL | Status: DC | PRN

## 2021-03-06 MED ORDER — CHLORHEXIDINE GLUCONATE CLOTH 2 % EX PADS
6.0000 | MEDICATED_PAD | Freq: Once | CUTANEOUS | Status: AC
  Administered 2021-03-06: 6 via TOPICAL

## 2021-03-06 MED ORDER — CHLORHEXIDINE GLUCONATE 0.12 % MT SOLN
15.0000 mL | OROMUCOSAL | Status: AC
  Administered 2021-03-06: 15 mL via OROMUCOSAL
  Filled 2021-03-06: qty 15

## 2021-03-06 MED ORDER — ASPIRIN EC 325 MG PO TBEC: 325.0000 mg | DELAYED_RELEASE_TABLET | Freq: Once | ORAL | Status: AC

## 2021-03-06 MED ORDER — CHLORHEXIDINE GLUCONATE CLOTH 2 % EX PADS
6.0000 | MEDICATED_PAD | Freq: Once | CUTANEOUS | Status: AC
  Administered 2021-03-07: 6 via TOPICAL

## 2021-03-06 MED ORDER — IOHEXOL 300 MG/ML  SOLN
100.0000 mL | Freq: Once | INTRAMUSCULAR | Status: AC | PRN
  Administered 2021-03-06: 50 mL via INTRA_ARTERIAL

## 2021-03-06 MED ORDER — HEPARIN SODIUM (PORCINE) 1000 UNIT/ML IJ SOLN
INTRAMUSCULAR | Status: DC | PRN
  Administered 2021-03-06: 3000 [IU] via INTRAVENOUS

## 2021-03-06 MED ORDER — FENTANYL CITRATE (PF) 100 MCG/2ML IJ SOLN
INTRAMUSCULAR | Status: DC | PRN
  Administered 2021-03-06: 100 ug via INTRAVENOUS

## 2021-03-06 MED ORDER — EPHEDRINE SULFATE-NACL 50-0.9 MG/10ML-% IV SOSY
PREFILLED_SYRINGE | INTRAVENOUS | Status: DC | PRN
  Administered 2021-03-06: 2.5 mg via INTRAVENOUS

## 2021-03-06 MED ORDER — CEFAZOLIN SODIUM-DEXTROSE 2-4 GM/100ML-% IV SOLN: 2.0000 g | INTRAVENOUS | Status: DC

## 2021-03-06 MED ORDER — CEFAZOLIN SODIUM-DEXTROSE 2-4 GM/100ML-% IV SOLN
2.0000 g | INTRAVENOUS | Status: AC
  Administered 2021-03-06: 2 g via INTRAVENOUS

## 2021-03-06 MED ORDER — ONDANSETRON HCL 4 MG/2ML IJ SOLN
INTRAMUSCULAR | Status: DC | PRN
  Administered 2021-03-06: 4 mg via INTRAVENOUS

## 2021-03-06 MED ORDER — ROCURONIUM BROMIDE 10 MG/ML (PF) SYRINGE
PREFILLED_SYRINGE | INTRAVENOUS | Status: DC | PRN
  Administered 2021-03-06: 60 mg via INTRAVENOUS

## 2021-03-06 MED ORDER — LIDOCAINE HCL 1 % IJ SOLN
INTRAMUSCULAR | Status: AC
  Filled 2021-03-06: qty 20

## 2021-03-06 MED ORDER — MIDAZOLAM HCL 5 MG/5ML IJ SOLN
INTRAMUSCULAR | Status: DC | PRN
  Administered 2021-03-06: 1 mg via INTRAVENOUS

## 2021-03-06 MED ORDER — LABETALOL HCL 5 MG/ML IV SOLN
INTRAVENOUS | Status: DC | PRN
  Administered 2021-03-06 (×3): 5 mg via INTRAVENOUS

## 2021-03-06 NOTE — Sedation Documentation (Signed)
Handoff with Kathlee Nations, RN in PACU.  Patient's groin site level 0, dressing is clean/dry/intact. Pulses palpable.   8 fr angioseal was deployed at 1044.

## 2021-03-06 NOTE — Anesthesia Postprocedure Evaluation (Signed)
Anesthesia Post Note  Patient: Kenneth Montana Sr.  Procedure(s) Performed: Right carotid body tumor embolization     Patient location during evaluation: PACU Anesthesia Type: General Level of consciousness: awake Pain management: pain level controlled Vital Signs Assessment: post-procedure vital signs reviewed and stable Respiratory status: spontaneous breathing, nonlabored ventilation, respiratory function stable and patient connected to nasal cannula oxygen Cardiovascular status: blood pressure returned to baseline and stable Postop Assessment: no apparent nausea or vomiting Anesthetic complications: no   No notable events documented.  Last Vitals:  Vitals:   03/06/21 1900 03/06/21 2000  BP: 103/66   Pulse: 100   Resp: (!) 23   Temp:  36.9 C  SpO2: 99%     Last Pain:  Vitals:   03/06/21 2000  TempSrc: Oral  PainSc:                  Karyl Kinnier Kieffer Blatz

## 2021-03-06 NOTE — Anesthesia Procedure Notes (Addendum)
Procedure Name: Intubation Date/Time: 03/06/2021 8:52 AM Performed by: Wilburn Cornelia, CRNA Pre-anesthesia Checklist: Patient identified, Emergency Drugs available, Suction available, Patient being monitored and Timeout performed Patient Re-evaluated:Patient Re-evaluated prior to induction Oxygen Delivery Method: Circle system utilized Preoxygenation: Pre-oxygenation with 100% oxygen Induction Type: IV induction Ventilation: Mask ventilation without difficulty Laryngoscope Size: Mac and 4 Grade View: Grade I Tube type: Oral Tube size: 7.5 mm Number of attempts: 1 Airway Equipment and Method: Stylet Placement Confirmation: ETT inserted through vocal cords under direct vision, positive ETCO2, CO2 detector and breath sounds checked- equal and bilateral Secured at: 24 cm Tube secured with: Tape Dental Injury: Teeth and Oropharynx as per pre-operative assessment

## 2021-03-06 NOTE — Transfer of Care (Signed)
Immediate Anesthesia Transfer of Care Note  Patient: Kenneth L Hackler Sr.  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  Last Pain:  Vitals:   03/06/21 0639  TempSrc:   PainSc: 0-No pain      Patients Stated Pain Goal: 0 (41/74/08 [id number])  Complications: No notable events documented.

## 2021-03-06 NOTE — H&P (Signed)
Chief Complaint: Patient was seen in consultation today for a carotid angiogram with right carotid body mass embolization at the request of Dr Trula Slade  Referring Physician(s):  Dr Trula Slade  Supervising Physician: Luanne Bras  Patient Status: [facility] - Out-pt  History of Present Illness: Kenneth Blackwell. is a 40 y.o. male with PMH of paraganglioma and bilateral carotid body mass, who is known to [facility] for previous carotid angiogram with left carotid body mass embolization on 02/19/2021 with Dr. Estanislado Pandy.  Patient presents to [place] today for a carotid angiogram with embolization of right carotid body mass.  He is scheduled for carotid mass resection with vascular surgery tomorrow 03/07/2021.  Patient laying in bed, not in acute distress.  Denise new onset of neurological symptoms, headache, fever, chills, shortness of breath, cough, chest pain, abdominal pain, nausea ,vomiting, and bleeding.   Past Medical History:  Diagnosis Date   Family history of pancreatic cancer 09/09/2020   Family history of pheochromocytoma 09/09/2020   Lumbar herniated disc    Monoallelic mutation of SDHD gene 10/01/2020    Past Surgical History:  Procedure Laterality Date   CAROTID BODY TUMOR EXCISION Left 02/21/2021   Procedure: TUMOR EXCISION LEFT CAROTID BODY;  Surgeon: Izora Gala, MD;  Location: [place];  Service: ENT;  Laterality: Left;   HIP SURGERY     IR ANGIO INTRA EXTRACRAN SEL COM CAROTID INNOMINATE BILAT MOD SED  02/19/2021   IR ANGIOGRAM FOLLOW UP STUDY  02/19/2021   IR ANGIOGRAM FOLLOW UP STUDY  02/19/2021   IR ANGIOGRAM FOLLOW UP STUDY  02/19/2021   IR TRANSCATH/EMBOLIZ  02/19/2021   RADIOLOGY WITH ANESTHESIA N/A 02/19/2021   Procedure: EMBOLIZATION;  Surgeon: Luanne Bras, MD;  Location: [place];  Service: Radiology;  Laterality: N/A;   RESECTION OF RETROPERITONEAL MASS N/A 12/16/2020   Procedure: EXCISION OF RETROPERITONEAL MASS PHEOCHROMOCYTOMA;  Surgeon: Dwan Bolt,  MD;  Location: [place];  Service: General;  Laterality: N/A;    Allergies: Patient has no known allergies.  Medications: Prior to Admission medications   Medication Sig Start Date End Date Taking? Authorizing Provider  ibuprofen (ADVIL) 800 MG tablet Take 800 mg by mouth every 8 (eight) hours as needed for headache or moderate pain.   Yes [provider]  rosuvastatin (CRESTOR) 40 MG tablet Take 1 tablet (40 mg total) by mouth daily. 02/22/21  Yes Setzer, Ed[address], PA-C  acetaminophen (TYLENOL) 500 MG tablet Take 2 tablets (1,000 mg total) by mouth every 8 (eight) hours as needed for mild pain. Patient not taking: No sig reported 12/19/20   Dwan Bolt, MD  docusate sodium (COLACE) 100 MG capsule Take 1 capsule (100 mg total) by mouth 2 (two) times daily. Patient not taking: No sig reported 12/19/20   Dwan Bolt, MD  methocarbamol (ROBAXIN) 500 MG tablet Take 1 tablet (500 mg total) by mouth every 6 (six) hours as needed for muscle spasms. Patient not taking: No sig reported 12/19/20   Dwan Bolt, MD  omeprazole (PRILOSEC) 40 MG capsule Take 1 capsule (40 mg total) by mouth daily. Patient not taking: No sig reported 08/26/20   Jillyn Ledger, PA-C  oxyCODONE (OXY IR/ROXICODONE) 5 MG immediate release tablet Take 1 tablet (5 mg total) by mouth every 4 (four) hours as needed for moderate pain. Patient not taking: Reported on 03/04/2021 02/22/21   Barbie Banner, PA-C  polyethylene glycol (MIRALAX / GLYCOLAX) 17 g packet Take 17 g by mouth daily  as needed (constipation). Patient not taking: Reported on 03/04/2021 12/19/20   Dwan Bolt, MD     Family History  Problem Relation Age of Onset   Hypertension Father    Pancreatic cancer Maternal Grandmother        dx early 30s   Prostate cancer Paternal Grandfather        d. 40s   Other Half-Brother        paternal; paraganglioma and pheochromocytoma; SDHD mutation    Other Half-Brother        paternal; ?  paraganglioma    Social History   Socioeconomic History   Marital status: Married    Spouse name: Not on file   Number of children: 4   Years of education: Not on file   Highest education level: Not on file  Occupational History   Occupation: Delivery driver for [employer]  Tobacco Use   Smoking status: Former    Types: Cigarettes    Quit date: 08/2020    Years since quitting: 0.5   Smokeless tobacco: Never  Vaping Use   Vaping Use: Former  Substance and Sexual Activity   Alcohol use: Yes    Comment: occassionally- "once every 2 weeks"   Drug use: No   Sexual activity: Yes  Other Topics Concern   Not on file  Social History Narrative   Not on file   Social Determinants of Health   Financial Resource Strain: Not on file  Food Insecurity: Not on file  Transportation Needs: Not on file  Physical Activity: Not on file  Stress: Not on file  Social Connections: Not on file     Review of Systems: A 12 point ROS discussed and pertinent positives are indicated in the HPI above.  All other systems are negative.  Vital Signs: There were no vitals taken for this visit.   Physical Exam Vitals reviewed.  Constitutional:      General: He is not in acute distress.    Appearance: Normal appearance. He is not ill-appearing.  HENT:     Head: Normocephalic and atraumatic.     Mouth/Throat:     Mouth: Mucous membranes are moist.  Eyes:     Extraocular Movements: Extraocular movements intact.  Cardiovascular:     Rate and Rhythm: Normal rate and regular rhythm.     Pulses: Normal pulses.     Heart sounds: Normal heart sounds.     Comments: Bilateral DP 2+ Pulmonary:     Effort: Pulmonary effort is normal.     Breath sounds: Normal breath sounds.  Abdominal:     General: Abdomen is flat. Bowel sounds are normal.     Palpations: Abdomen is soft.  Musculoskeletal:     Cervical back: Normal range of motion and neck supple.  Skin:    General: Skin is warm and dry.      Coloration: Skin is not jaundiced or pale.  Neurological:     Mental Status: He is alert and oriented to person, place, and time.  Psychiatric:        Mood and Affect: Mood normal.        Behavior: Behavior normal.        Judgment: Judgment normal.    MD Evaluation Airway: WNL Heart: WNL Abdomen: WNL Chest/ Lungs: WNL Mallampati/Airway Score: Two  Imaging: IR Transcath/Emboliz  Result Date: 02/24/2021 CLINICAL DATA:  Patient with a history of bilateral carotid body tumors left larger than right. Pre surgical embolization of the left carotid  body tumor planned. EXAM: TRANSCATHETER THERAPY EMBOLIZATION COMPARISON:  CT angiogram of the head and neck December 31, 2020. MEDICATIONS: Heparin 3,000 units IV. Ancef 2 g IV antibiotic was administered within 1 hour of the procedure. ANESTHESIA/SEDATION: General anesthesia. CONTRAST:  Omnipaque 350 approximately 100 mL. FLUOROSCOPY TIME:  Fluoroscopy Time: 27 minutes 6 seconds (649 mGy). COMPLICATIONS: None immediate. TECHNIQUE: Informed written consent was obtained from the patient after a thorough discussion of the procedural risks, benefits and alternatives. All questions were addressed. Maximal Sterile Barrier Technique was utilized including caps, mask, sterile gowns, sterile gloves, sterile drape, hand hygiene and skin antiseptic. A timeout was performed prior to the initiation of the procedure. The right groin was prepped and draped in the usual sterile fashion. Thereafter using modified Seldinger technique, transfemoral access into the right common femoral artery was obtained without difficulty. Over a 0.035 inch guidewire, a 5 French Pinnacle sheath was inserted. Through this, and also over 0.035 inch guidewire, a 5 [place] JB 1 catheter was advanced to the aortic arch region and selectively positioned in the innominate artery, right common carotid artery, left common carotid artery and the left external carotid artery. FINDINGS: The innominate  arteriogram demonstrates the proximal right subclavian artery and the right common carotid artery to be widely patent. The right vertebral artery origin is widely patent. The vessel is seen to opacify to the cranial skull base. Patency is seen of the right vertebrobasilar junction and the opacified portions of the basilar artery on the lateral projection. The right common carotid arteriogram demonstrates the right external carotid artery and its major branches to be widely patent. A prominent branch with hair loop turn is seen arising from the ascending pharyngeal artery projecting inferiorly into a globular hypervascular mass splaying the carotid bifurcation. The right internal carotid artery at the bulb to the cranial skull base is widely patent. The petrous, the cavernous and the supraclinoid segments are widely patent. The right middle cerebral artery and the right anterior cerebral artery opacify into the capillary and venous phases. Prompt cross filling via the anterior communicating artery of the left anterior cerebral A2 segment and distally is noted as well. The left common carotid arteriogram demonstrates the left external carotid artery origin to be widely patent. Abnormal prominence is seen of the ascending pharyngeal branch giving rise to at least 2 feeders at the apex of the large hypervascular mass projecting inferiorly into the superior aspect of this mass with subsequent dense tumor opacification with brisk venous outflow along the medial and the lateral aspect into the internal jugular vein. The left internal carotid artery at the bulb to the cranial skull base is widely patent. The petrous, the cavernous and the supraclinoid segments are widely patent. The left middle cerebral artery and the left anterior cerebral artery opacify into the capillary and venous phases. Patency of anterior communicating artery is again evidenced with cross-filling into the right anterior cerebral A2 segment.  ENDOVASCULAR EMBOLIZATION OF PROMINENT HYPERVASCULAR LEFT CAROTID BODY TUMOR A diagnostic JB 1 catheter in the left internal carotid artery was then exchanged over a 0.035 300 cm guidewire for a 25 cm 8 French Pinnacle sheath, through which an 087 balloon guide catheter was advanced and positioned in the mid left common carotid artery. The guidewire was removed. Good aspiration obtained from the hub of the balloon guide catheter. A control arteriogram performed through the balloon guide in the left common carotid artery again demonstrated no evidence of spasms, dissections or of intraluminal filling defects. Again  demonstrated was the prominent hypervascular left carotid body tumor mass. Over a 0.035 inch Roadrunner guidewire, a 115 cm 5 [place] Navien guide catheter was then navigated using by removed technique and constant fluoroscopic guidance and advanced into the proximal hypertrophic ascending pharyngeal branch. The guidewire was removed. Good aspiration obtained from the hub of the 5 [place] Navien guide catheter. A 4 mm x 11 mm Scepter extra compliant balloon was then prepped and purged of air in the usual manner. Over a 0.014 inch standard Synchro micro guidewire with a moderate J configuration, the prepped Scepter C balloon was advanced and positioned just inside the proximal aspect of the prominent arterial feeder with 2 secondary feeders supplying this hypervascular mass. The guidewire was removed. Good aspiration obtained from the hub of the Scepter C extra compliant microcatheter. A gentle control arteriogram performed through the Scepter microcatheter demonstrated brisk flow antegradely into the hypervascular mass. No dangerous anastomosis was angiographically noted into the left vertebral artery or the left internal carotid artery. Approximately .5 mL of DMSO was then injected to clear the dead space of the Scepter extra compliant balloon microcatheter. Thereafter, Onyx 18 was then checked slowly and  deliberately under constant roadmap technique with the Scepter balloon inflated for proximal flow arrest. Injection was continued slowly deliberately without any reflux. Near complete obliteration was noted of the main feeders at the apex of the hypervascular mass with permeation into the smaller arterial branches. Once reflux was evident at the site of the balloon, the balloon was deflated. Control arteriogram performed through the 5 [place] Navien guide catheter demonstrated near 85-90% devascularization of the tumor mass. There continued to be twigs arising from the more proximal portion and of the superior thyroidal artery. The Navien guide catheter was then retrieved more proximally just inside the origin of the external carotid artery. An Echelon 10 2 tip microcatheter was then advanced over a 0.014 inch standard Synchro micro guidewire and super selectively positioned into a side branch arising from the proximal portion of the ascending pharyngeal branch. The micro guidewire was removed. Good aspiration obtained from the hub of the Echelon microcatheter. Gentle control arteriogram performed through the microcatheter demonstrated hypervascular opacification of the upper 2/3 of the capsule of the carotid body tumor. This vessel was then embolized with 3 Axium coils starting with a 1.5 x 2 mm coil, followed by a 2 mm x 2 mm Axium coil and finally a 3 mm x 4 mm Axium coil. Each of these was introduced into the selected arterial branch under constant fluoroscopic guidance. Prior to each detachment, a control arteriogram was performed through the 5 [place] guide catheter to ensure safe position of the coil. Following the deployment of the third coil, complete occlusion of the arterial branch was noted. The microcatheter was then retrieved and removed. A control arteriogram was then performed through the 5 [place] Navien guide catheter at the origin of the left external carotid artery. There continued to be significant  devascularization of the entire tumor mass. Delayed faint tumor stain was noted in the carotid body tumor itself. The Navien guide catheter was removed. A control arteriogram performed through the balloon guide catheter in the left common carotid artery demonstrated no evidence of abnormality in the internal carotid artery intracranially or extra cranially. Intracranially no evidence of intraluminal filling defects or of occlusion was noted. The balloon guide was removed. The 8 French sheath was removed. Hemostasis was achieved at the right groin puncture site. Distal pulses remained palpable in both  feet unchanged. Patient was then extubated. Upon recovery, the patient obeyed simple commands appropriately. He denied any headaches, nausea or vomiting. His vision was normal. He was able to move all 4 extremities normally. He was then transferred to the PACU and then neuro ICU. Patient was re-examined in the late afternoon. Neurologically the patient was normal nonfocal. He denied any symptoms of difficulty with swallowing liquids or solids. Patient had complaint of a slight pain on the left side of his neck. This responded to Tylenol. Patient was then transferred to the vascular surgery service for preparation prior to scheduled surgery on Friday. IMPRESSION: Status post pre-surgical embolization of a large hypervascular carotid body tumor on the left with Onyx 18 and coils achieving approximately 85-90% devascularization. PLAN: As per vascular surgery. Electronically Signed   By: Luanne Bras M.D.   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 Pre surgical embolization of the left carotid body tumor planned. EXAM: TRANSCATHETER THERAPY EMBOLIZATION COMPARISON:  CT angiogram of the head and neck December 31, 2020. MEDICATIONS: Heparin 3,000 units IV. Ancef 2 g IV antibiotic was  administered within 1 hour of the procedure. ANESTHESIA/SEDATION: General anesthesia. CONTRAST:  Omnipaque 350 approximately 100 mL. FLUOROSCOPY TIME:  Fluoroscopy Time: 27 minutes 6 seconds (649 mGy). COMPLICATIONS: None immediate. TECHNIQUE: Informed written consent was obtained from the patient after a thorough discussion of the procedural risks, benefits and alternatives. All questions were addressed. Maximal Sterile Barrier Technique was utilized including caps, mask, sterile gowns, sterile gloves, sterile drape, hand hygiene and skin antiseptic. A timeout was performed prior to the initiation of the procedure. The right groin was prepped and draped in the usual sterile fashion. Thereafter using modified Seldinger technique, transfemoral access into the right common femoral artery was obtained without difficulty. Over a 0.035 inch guidewire, a 5 French Pinnacle sheath was inserted. Through this, and also over 0.035 inch guidewire, a 5 [place] JB 1 catheter was advanced to the aortic arch region and selectively positioned in the innominate artery, right common carotid artery, left common carotid artery and the left external carotid artery. FINDINGS: The innominate arteriogram demonstrates the proximal right subclavian artery and the right common carotid artery to be widely patent. The right vertebral artery origin is widely patent. The vessel is seen to opacify to the cranial skull base. Patency is seen of the right vertebrobasilar junction and the opacified portions of the basilar artery on the lateral projection. The right common carotid arteriogram demonstrates the right external carotid artery and its major branches to be widely patent. A prominent branch with hair loop turn is seen arising from the ascending pharyngeal artery projecting inferiorly into a globular hypervascular mass splaying the carotid bifurcation. The right internal carotid artery at the bulb to the cranial skull base is widely patent. The  petrous, the cavernous and the supraclinoid segments are widely patent. The right middle cerebral artery and the right anterior cerebral artery opacify into the capillary and venous phases. Prompt cross filling via the anterior communicating artery of the left anterior cerebral A2 segment and distally is noted as well. The left common carotid arteriogram demonstrates the left external carotid artery origin to be widely patent. Abnormal prominence is seen of the ascending pharyngeal branch giving rise to at least 2 feeders at the apex of the large hypervascular mass projecting inferiorly into the superior aspect of this  mass with subsequent dense tumor opacification with brisk venous outflow along the medial and the lateral aspect into the internal jugular vein. The left internal carotid artery at the bulb to the cranial skull base is widely patent. The petrous, the cavernous and the supraclinoid segments are widely patent. The left middle cerebral artery and the left anterior cerebral artery opacify into the capillary and venous phases. Patency of anterior communicating artery is again evidenced with cross-filling into the right anterior cerebral A2 segment. ENDOVASCULAR EMBOLIZATION OF PROMINENT HYPERVASCULAR LEFT CAROTID BODY TUMOR A diagnostic JB 1 catheter in the left internal carotid artery was then exchanged over a 0.035 300 cm guidewire for a 25 cm 8 French Pinnacle sheath, through which an 087 balloon guide catheter was advanced and positioned in the mid left common carotid artery. The guidewire was removed. Good aspiration obtained from the hub of the balloon guide catheter. A control arteriogram performed through the balloon guide in the left common carotid artery again demonstrated no evidence of spasms, dissections or of intraluminal filling defects. Again demonstrated was the prominent hypervascular left carotid body tumor mass. Over a 0.035 inch Roadrunner guidewire, a 115 cm 5 [place] Navien guide  catheter was then navigated using by removed technique and constant fluoroscopic guidance and advanced into the proximal hypertrophic ascending pharyngeal branch. The guidewire was removed. Good aspiration obtained from the hub of the 5 [place] Navien guide catheter. A 4 mm x 11 mm Scepter extra compliant balloon was then prepped and purged of air in the usual manner. Over a 0.014 inch standard Synchro micro guidewire with a moderate J configuration, the prepped Scepter C balloon was advanced and positioned just inside the proximal aspect of the prominent arterial feeder with 2 secondary feeders supplying this hypervascular mass. The guidewire was removed. Good aspiration obtained from the hub of the Scepter C extra compliant microcatheter. A gentle control arteriogram performed through the Scepter microcatheter demonstrated brisk flow antegradely into the hypervascular mass. No dangerous anastomosis was angiographically noted into the left vertebral artery or the left internal carotid artery. Approximately .5 mL of DMSO was then injected to clear the dead space of the Scepter extra compliant balloon microcatheter. Thereafter, Onyx 18 was then checked slowly and deliberately under constant roadmap technique with the Scepter balloon inflated for proximal flow arrest. Injection was continued slowly deliberately without any reflux. Near complete obliteration was noted of the main feeders at the apex of the hypervascular mass with permeation into the smaller arterial branches. Once reflux was evident at the site of the balloon, the balloon was deflated. Control arteriogram performed through the 5 [place] Navien guide catheter demonstrated near 85-90% devascularization of the tumor mass. There continued to be twigs arising from the more proximal portion and of the superior thyroidal artery. The Navien guide catheter was then retrieved more proximally just inside the origin of the external carotid artery. An Echelon 10 2  tip microcatheter was then advanced over a 0.014 inch standard Synchro micro guidewire and super selectively positioned into a side branch arising from the proximal portion of the ascending pharyngeal branch. The micro guidewire was removed. Good aspiration obtained from the hub of the Echelon microcatheter. Gentle control arteriogram performed through the microcatheter demonstrated hypervascular opacification of the upper 2/3 of the capsule of the carotid body tumor. This vessel was then embolized with 3 Axium coils starting with a 1.5 x 2 mm coil, followed by a 2 mm x 2 mm Axium coil and finally a 3 mm  x 4 mm Axium coil. Each of these was introduced into the selected arterial branch under constant fluoroscopic guidance. Prior to each detachment, a control arteriogram was performed through the 5 [place] guide catheter to ensure safe position of the coil. Following the deployment of the third coil, complete occlusion of the arterial branch was noted. The microcatheter was then retrieved and removed. A control arteriogram was then performed through the 5 [place] Navien guide catheter at the origin of the left external carotid artery. There continued to be significant devascularization of the entire tumor mass. Delayed faint tumor stain was noted in the carotid body tumor itself. The Navien guide catheter was removed. A control arteriogram performed through the balloon guide catheter in the left common carotid artery demonstrated no evidence of abnormality in the internal carotid artery intracranially or extra cranially. Intracranially no evidence of intraluminal filling defects or of occlusion was noted. The balloon guide was removed. The 8 French sheath was removed. Hemostasis was achieved at the right groin puncture site. Distal pulses remained palpable in both feet unchanged. Patient was then extubated. Upon recovery, the patient obeyed simple commands appropriately. He denied any headaches, nausea or vomiting. His  vision was normal. He was able to move all 4 extremities normally. He was then transferred to the PACU and then neuro ICU. Patient was re-examined in the late afternoon. Neurologically the patient was normal nonfocal. He denied any symptoms of difficulty with swallowing liquids or solids. Patient had complaint of a slight pain on the left side of his neck. This responded to Tylenol. Patient was then transferred to the vascular surgery service for preparation prior to scheduled surgery on Friday. IMPRESSION: Status post pre-surgical embolization of a large hypervascular carotid body tumor on the left with Onyx 18 and coils achieving approximately 85-90% devascularization. PLAN: As per vascular surgery. Electronically Signed   By: Luanne Bras M.D.   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 Pre surgical embolization of the left carotid body tumor planned. EXAM: TRANSCATHETER THERAPY EMBOLIZATION COMPARISON:  CT angiogram of the head and neck December 31, 2020. MEDICATIONS: Heparin 3,000 units IV. Ancef 2 g IV antibiotic was administered within 1 hour of the procedure. ANESTHESIA/SEDATION: General anesthesia. CONTRAST:  Omnipaque 350 approximately 100 mL. FLUOROSCOPY TIME:  Fluoroscopy Time: 27 minutes 6 seconds (649 mGy). COMPLICATIONS: None immediate. TECHNIQUE: Informed written consent was obtained from the patient after a thorough discussion of the procedural risks, benefits and alternatives. All questions were addressed. Maximal Sterile Barrier Technique was utilized including caps, mask, sterile gowns, sterile gloves, sterile drape, hand hygiene and skin antiseptic. A timeout was performed prior to the initiation of the procedure. The right groin was prepped and draped in the usual sterile fashion. Thereafter using modified Seldinger technique, transfemoral access into the  right common femoral artery was obtained without difficulty. Over a 0.035 inch guidewire, a 5 French Pinnacle sheath was inserted. Through this, and also over 0.035 inch guidewire, a 5 [place] JB 1 catheter was advanced to the aortic arch region and selectively positioned in the innominate artery, right common carotid artery, left common carotid artery and the left external carotid artery. FINDINGS: The innominate arteriogram demonstrates the proximal right subclavian artery and the right common carotid artery to be widely patent. The right vertebral artery origin is widely patent. The vessel is seen to opacify to the cranial  skull base. Patency is seen of the right vertebrobasilar junction and the opacified portions of the basilar artery on the lateral projection. The right common carotid arteriogram demonstrates the right external carotid artery and its major branches to be widely patent. A prominent branch with hair loop turn is seen arising from the ascending pharyngeal artery projecting inferiorly into a globular hypervascular mass splaying the carotid bifurcation. The right internal carotid artery at the bulb to the cranial skull base is widely patent. The petrous, the cavernous and the supraclinoid segments are widely patent. The right middle cerebral artery and the right anterior cerebral artery opacify into the capillary and venous phases. Prompt cross filling via the anterior communicating artery of the left anterior cerebral A2 segment and distally is noted as well. The left common carotid arteriogram demonstrates the left external carotid artery origin to be widely patent. Abnormal prominence is seen of the ascending pharyngeal branch giving rise to at least 2 feeders at the apex of the large hypervascular mass projecting inferiorly into the superior aspect of this mass with subsequent dense tumor opacification with brisk venous outflow along the medial and the lateral aspect into the internal jugular  vein. The left internal carotid artery at the bulb to the cranial skull base is widely patent. The petrous, the cavernous and the supraclinoid segments are widely patent. The left middle cerebral artery and the left anterior cerebral artery opacify into the capillary and venous phases. Patency of anterior communicating artery is again evidenced with cross-filling into the right anterior cerebral A2 segment. ENDOVASCULAR EMBOLIZATION OF PROMINENT HYPERVASCULAR LEFT CAROTID BODY TUMOR A diagnostic JB 1 catheter in the left internal carotid artery was then exchanged over a 0.035 300 cm guidewire for a 25 cm 8 French Pinnacle sheath, through which an 087 balloon guide catheter was advanced and positioned in the mid left common carotid artery. The guidewire was removed. Good aspiration obtained from the hub of the balloon guide catheter. A control arteriogram performed through the balloon guide in the left common carotid artery again demonstrated no evidence of spasms, dissections or of intraluminal filling defects. Again demonstrated was the prominent hypervascular left carotid body tumor mass. Over a 0.035 inch Roadrunner guidewire, a 115 cm 5 [place] Navien guide catheter was then navigated using by removed technique and constant fluoroscopic guidance and advanced into the proximal hypertrophic ascending pharyngeal branch. The guidewire was removed. Good aspiration obtained from the hub of the 5 [place] Navien guide catheter. A 4 mm x 11 mm Scepter extra compliant balloon was then prepped and purged of air in the usual manner. Over a 0.014 inch standard Synchro micro guidewire with a moderate J configuration, the prepped Scepter C balloon was advanced and positioned just inside the proximal aspect of the prominent arterial feeder with 2 secondary feeders supplying this hypervascular mass. The guidewire was removed. Good aspiration obtained from the hub of the Scepter C extra compliant microcatheter. A gentle control  arteriogram performed through the Scepter microcatheter demonstrated brisk flow antegradely into the hypervascular mass. No dangerous anastomosis was angiographically noted into the left vertebral artery or the left internal carotid artery. Approximately .5 mL of DMSO was then injected to clear the dead space of the Scepter extra compliant balloon microcatheter. Thereafter, Onyx 18 was then checked slowly and deliberately under constant roadmap technique with the Scepter balloon inflated for proximal flow arrest. Injection was continued slowly deliberately without any reflux. Near complete obliteration was noted of the main feeders at the apex of  the hypervascular mass with permeation into the smaller arterial branches. Once reflux was evident at the site of the balloon, the balloon was deflated. Control arteriogram performed through the 5 [place] Navien guide catheter demonstrated near 85-90% devascularization of the tumor mass. There continued to be twigs arising from the more proximal portion and of the superior thyroidal artery. The Navien guide catheter was then retrieved more proximally just inside the origin of the external carotid artery. An Echelon 10 2 tip microcatheter was then advanced over a 0.014 inch standard Synchro micro guidewire and super selectively positioned into a side branch arising from the proximal portion of the ascending pharyngeal branch. The micro guidewire was removed. Good aspiration obtained from the hub of the Echelon microcatheter. Gentle control arteriogram performed through the microcatheter demonstrated hypervascular opacification of the upper 2/3 of the capsule of the carotid body tumor. This vessel was then embolized with 3 Axium coils starting with a 1.5 x 2 mm coil, followed by a 2 mm x 2 mm Axium coil and finally a 3 mm x 4 mm Axium coil. Each of these was introduced into the selected arterial branch under constant fluoroscopic guidance. Prior to each detachment, a control  arteriogram was performed through the 5 [place] guide catheter to ensure safe position of the coil. Following the deployment of the third coil, complete occlusion of the arterial branch was noted. The microcatheter was then retrieved and removed. A control arteriogram was then performed through the 5 [place] Navien guide catheter at the origin of the left external carotid artery. There continued to be significant devascularization of the entire tumor mass. Delayed faint tumor stain was noted in the carotid body tumor itself. The Navien guide catheter was removed. A control arteriogram performed through the balloon guide catheter in the left common carotid artery demonstrated no evidence of abnormality in the internal carotid artery intracranially or extra cranially. Intracranially no evidence of intraluminal filling defects or of occlusion was noted. The balloon guide was removed. The 8 French sheath was removed. Hemostasis was achieved at the right groin puncture site. Distal pulses remained palpable in both feet unchanged. Patient was then extubated. Upon recovery, the patient obeyed simple commands appropriately. He denied any headaches, nausea or vomiting. His vision was normal. He was able to move all 4 extremities normally. He was then transferred to the PACU and then neuro ICU. Patient was re-examined in the late afternoon. Neurologically the patient was normal nonfocal. He denied any symptoms of difficulty with swallowing liquids or solids. Patient had complaint of a slight pain on the left side of his neck. This responded to Tylenol. Patient was then transferred to the vascular surgery service for preparation prior to scheduled surgery on Friday. IMPRESSION: Status post pre-surgical embolization of a large hypervascular carotid body tumor on the left with Onyx 18 and coils achieving approximately 85-90% devascularization. PLAN: As per vascular surgery. Electronically Signed   By: Luanne Bras M.D.   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 Pre surgical embolization of the left carotid body tumor planned. EXAM: TRANSCATHETER THERAPY EMBOLIZATION COMPARISON:  CT angiogram of the head and neck December 31, 2020. MEDICATIONS: Heparin 3,000 units IV. Ancef 2 g IV antibiotic was administered within 1 hour of the procedure. ANESTHESIA/SEDATION: General anesthesia. CONTRAST:  Omnipaque 350 approximately 100 mL. FLUOROSCOPY TIME:  Fluoroscopy Time: 27 minutes 6  seconds (649 mGy). COMPLICATIONS: None immediate. TECHNIQUE: Informed written consent was obtained from the patient after a thorough discussion of the procedural risks, benefits and alternatives. All questions were addressed. Maximal Sterile Barrier Technique was utilized including caps, mask, sterile gowns, sterile gloves, sterile drape, hand hygiene and skin antiseptic. A timeout was performed prior to the initiation of the procedure. The right groin was prepped and draped in the usual sterile fashion. Thereafter using modified Seldinger technique, transfemoral access into the right common femoral artery was obtained without difficulty. Over a 0.035 inch guidewire, a 5 French Pinnacle sheath was inserted. Through this, and also over 0.035 inch guidewire, a 5 [place] JB 1 catheter was advanced to the aortic arch region and selectively positioned in the innominate artery, right common carotid artery, left common carotid artery and the left external carotid artery. FINDINGS: The innominate arteriogram demonstrates the proximal right subclavian artery and the right common carotid artery to be widely patent. The right vertebral artery origin is widely patent. The vessel is seen to opacify to the cranial skull base. Patency is seen of the right vertebrobasilar junction and the opacified portions of the basilar artery on the lateral  projection. The right common carotid arteriogram demonstrates the right external carotid artery and its major branches to be widely patent. A prominent branch with hair loop turn is seen arising from the ascending pharyngeal artery projecting inferiorly into a globular hypervascular mass splaying the carotid bifurcation. The right internal carotid artery at the bulb to the cranial skull base is widely patent. The petrous, the cavernous and the supraclinoid segments are widely patent. The right middle cerebral artery and the right anterior cerebral artery opacify into the capillary and venous phases. Prompt cross filling via the anterior communicating artery of the left anterior cerebral A2 segment and distally is noted as well. The left common carotid arteriogram demonstrates the left external carotid artery origin to be widely patent. Abnormal prominence is seen of the ascending pharyngeal branch giving rise to at least 2 feeders at the apex of the large hypervascular mass projecting inferiorly into the superior aspect of this mass with subsequent dense tumor opacification with brisk venous outflow along the medial and the lateral aspect into the internal jugular vein. The left internal carotid artery at the bulb to the cranial skull base is widely patent. The petrous, the cavernous and the supraclinoid segments are widely patent. The left middle cerebral artery and the left anterior cerebral artery opacify into the capillary and venous phases. Patency of anterior communicating artery is again evidenced with cross-filling into the right anterior cerebral A2 segment. ENDOVASCULAR EMBOLIZATION OF PROMINENT HYPERVASCULAR LEFT CAROTID BODY TUMOR A diagnostic JB 1 catheter in the left internal carotid artery was then exchanged over a 0.035 300 cm guidewire for a 25 cm 8 French Pinnacle sheath, through which an 087 balloon guide catheter was advanced and positioned in the mid left common carotid artery. The guidewire was  removed. Good aspiration obtained from the hub of the balloon guide catheter. A control arteriogram performed through the balloon guide in the left common carotid artery again demonstrated no evidence of spasms, dissections or of intraluminal filling defects. Again demonstrated was the prominent hypervascular left carotid body tumor mass. Over a 0.035 inch Roadrunner guidewire, a 115 cm 5 [place] Navien guide catheter was then navigated using by removed technique and constant fluoroscopic guidance and advanced into the proximal hypertrophic ascending pharyngeal branch. The guidewire was removed. Good aspiration obtained from the hub of  the 5 [place] Navien guide catheter. A 4 mm x 11 mm Scepter extra compliant balloon was then prepped and purged of air in the usual manner. Over a 0.014 inch standard Synchro micro guidewire with a moderate J configuration, the prepped Scepter C balloon was advanced and positioned just inside the proximal aspect of the prominent arterial feeder with 2 secondary feeders supplying this hypervascular mass. The guidewire was removed. Good aspiration obtained from the hub of the Scepter C extra compliant microcatheter. A gentle control arteriogram performed through the Scepter microcatheter demonstrated brisk flow antegradely into the hypervascular mass. No dangerous anastomosis was angiographically noted into the left vertebral artery or the left internal carotid artery. Approximately .5 mL of DMSO was then injected to clear the dead space of the Scepter extra compliant balloon microcatheter. Thereafter, Onyx 18 was then checked slowly and deliberately under constant roadmap technique with the Scepter balloon inflated for proximal flow arrest. Injection was continued slowly deliberately without any reflux. Near complete obliteration was noted of the main feeders at the apex of the hypervascular mass with permeation into the smaller arterial branches. Once reflux was evident at the site of  the balloon, the balloon was deflated. Control arteriogram performed through the 5 [place] Navien guide catheter demonstrated near 85-90% devascularization of the tumor mass. There continued to be twigs arising from the more proximal portion and of the superior thyroidal artery. The Navien guide catheter was then retrieved more proximally just inside the origin of the external carotid artery. An Echelon 10 2 tip microcatheter was then advanced over a 0.014 inch standard Synchro micro guidewire and super selectively positioned into a side branch arising from the proximal portion of the ascending pharyngeal branch. The micro guidewire was removed. Good aspiration obtained from the hub of the Echelon microcatheter. Gentle control arteriogram performed through the microcatheter demonstrated hypervascular opacification of the upper 2/3 of the capsule of the carotid body tumor. This vessel was then embolized with 3 Axium coils starting with a 1.5 x 2 mm coil, followed by a 2 mm x 2 mm Axium coil and finally a 3 mm x 4 mm Axium coil. Each of these was introduced into the selected arterial branch under constant fluoroscopic guidance. Prior to each detachment, a control arteriogram was performed through the 5 [place] guide catheter to ensure safe position of the coil. Following the deployment of the third coil, complete occlusion of the arterial branch was noted. The microcatheter was then retrieved and removed. A control arteriogram was then performed through the 5 [place] Navien guide catheter at the origin of the left external carotid artery. There continued to be significant devascularization of the entire tumor mass. Delayed faint tumor stain was noted in the carotid body tumor itself. The Navien guide catheter was removed. A control arteriogram performed through the balloon guide catheter in the left common carotid artery demonstrated no evidence of abnormality in the internal carotid artery intracranially or extra  cranially. Intracranially no evidence of intraluminal filling defects or of occlusion was noted. The balloon guide was removed. The 8 French sheath was removed. Hemostasis was achieved at the right groin puncture site. Distal pulses remained palpable in both feet unchanged. Patient was then extubated. Upon recovery, the patient obeyed simple commands appropriately. He denied any headaches, nausea or vomiting. His vision was normal. He was able to move all 4 extremities normally. He was then transferred to the PACU and then neuro ICU. Patient was re-examined in the late afternoon. Neurologically the patient  was normal nonfocal. He denied any symptoms of difficulty with swallowing liquids or solids. Patient had complaint of a slight pain on the left side of his neck. This responded to Tylenol. Patient was then transferred to the vascular surgery service for preparation prior to scheduled surgery on Friday. IMPRESSION: Status post pre-surgical embolization of a large hypervascular carotid body tumor on the left with Onyx 18 and coils achieving approximately 85-90% devascularization. PLAN: As per vascular surgery. Electronically Signed   By: Luanne Bras M.D.   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 Pre surgical embolization of the left carotid body tumor planned. EXAM: TRANSCATHETER THERAPY EMBOLIZATION COMPARISON:  CT angiogram of the head and neck December 31, 2020. MEDICATIONS: Heparin 3,000 units IV. Ancef 2 g IV antibiotic was administered within 1 hour of the procedure. ANESTHESIA/SEDATION: General anesthesia. CONTRAST:  Omnipaque 350 approximately 100 mL. FLUOROSCOPY TIME:  Fluoroscopy Time: 27 minutes 6 seconds (649 mGy). COMPLICATIONS: None immediate. TECHNIQUE: Informed written consent was obtained from the patient after a thorough  discussion of the procedural risks, benefits and alternatives. All questions were addressed. Maximal Sterile Barrier Technique was utilized including caps, mask, sterile gowns, sterile gloves, sterile drape, hand hygiene and skin antiseptic. A timeout was performed prior to the initiation of the procedure. The right groin was prepped and draped in the usual sterile fashion. Thereafter using modified Seldinger technique, transfemoral access into the right common femoral artery was obtained without difficulty. Over a 0.035 inch guidewire, a 5 [place] Pinnacle sheath was inserted. Through this, and also over 0.035 inch guidewire, a 5 [place] JB 1 catheter was advanced to the aortic arch region and selectively positioned in the innominate artery, right common carotid artery, left common carotid artery and the left external carotid artery. FINDINGS: The innominate arteriogram demonstrates the proximal right subclavian artery and the right common carotid artery to be widely patent. The right vertebral artery origin is widely patent. The vessel is seen to opacify to the cranial skull base. Patency is seen of the right vertebrobasilar junction and the opacified portions of the basilar artery on the lateral projection. The right common carotid arteriogram demonstrates the right external carotid artery and its major branches to be widely patent. A prominent branch with hair loop turn is seen arising from the ascending pharyngeal artery projecting inferiorly into a globular hypervascular mass splaying the carotid bifurcation. The right internal carotid artery at the bulb to the cranial skull base is widely patent. The petrous, the cavernous and the supraclinoid segments are widely patent. The right middle cerebral artery and the right anterior cerebral artery opacify into the capillary and venous phases. Prompt cross filling via the anterior communicating artery of the left anterior cerebral A2 segment and distally is noted as  well. The left common carotid arteriogram demonstrates the left external carotid artery origin to be widely patent. Abnormal prominence is seen of the ascending pharyngeal branch giving rise to at least 2 feeders at the apex of the large hypervascular mass projecting inferiorly into the superior aspect of this mass with subsequent dense tumor opacification with brisk venous outflow along the medial and the lateral aspect into the internal jugular vein. The left internal carotid artery at the bulb to the cranial skull base is widely patent. The petrous, the cavernous and the supraclinoid segments are widely patent.  The left middle cerebral artery and the left anterior cerebral artery opacify into the capillary and venous phases. Patency of anterior communicating artery is again evidenced with cross-filling into the right anterior cerebral A2 segment. ENDOVASCULAR EMBOLIZATION OF PROMINENT HYPERVASCULAR LEFT CAROTID BODY TUMOR A diagnostic JB 1 catheter in the left internal carotid artery was then exchanged over a 0.035 300 cm guidewire for a 25 cm 8 [place] Pinnacle sheath, through which an 087 balloon guide catheter was advanced and positioned in the mid left common carotid artery. The guidewire was removed. Good aspiration obtained from the hub of the balloon guide catheter. A control arteriogram performed through the balloon guide in the left common carotid artery again demonstrated no evidence of spasms, dissections or of intraluminal filling defects. Again demonstrated was the prominent hypervascular left carotid body tumor mass. Over a 0.035 inch Roadrunner guidewire, a 115 cm 5 [place] Navien guide catheter was then navigated using by removed technique and constant fluoroscopic guidance and advanced into the proximal hypertrophic ascending pharyngeal branch. The guidewire was removed. Good aspiration obtained from the hub of the 5 [place] Navien guide catheter. A 4 mm x 11 mm Scepter extra compliant balloon was  then prepped and purged of air in the usual manner. Over a 0.014 inch standard Synchro micro guidewire with a moderate J configuration, the prepped Scepter C balloon was advanced and positioned just inside the proximal aspect of the prominent arterial feeder with 2 secondary feeders supplying this hypervascular mass. The guidewire was removed. Good aspiration obtained from the hub of the Scepter C extra compliant microcatheter. A gentle control arteriogram performed through the Scepter microcatheter demonstrated brisk flow antegradely into the hypervascular mass. No dangerous anastomosis was angiographically noted into the left vertebral artery or the left internal carotid artery. Approximately .5 mL of DMSO was then injected to clear the dead space of the Scepter extra compliant balloon microcatheter. Thereafter, Onyx 18 was then checked slowly and deliberately under constant roadmap technique with the Scepter balloon inflated for proximal flow arrest. Injection was continued slowly deliberately without any reflux. Near complete obliteration was noted of the main feeders at the apex of the hypervascular mass with permeation into the smaller arterial branches. Once reflux was evident at the site of the balloon, the balloon was deflated. Control arteriogram performed through the 5 [place] Navien guide catheter demonstrated near 85-90% devascularization of the tumor mass. There continued to be twigs arising from the more proximal portion and of the superior thyroidal artery. The Navien guide catheter was then retrieved more proximally just inside the origin of the external carotid artery. An Echelon 10 2 tip microcatheter was then advanced over a 0.014 inch standard Synchro micro guidewire and super selectively positioned into a side branch arising from the proximal portion of the ascending pharyngeal branch. The micro guidewire was removed. Good aspiration obtained from the hub of the Echelon microcatheter. Gentle  control arteriogram performed through the microcatheter demonstrated hypervascular opacification of the upper 2/3 of the capsule of the carotid body tumor. This vessel was then embolized with 3 Axium coils starting with a 1.5 x 2 mm coil, followed by a 2 mm x 2 mm Axium coil and finally a 3 mm x 4 mm Axium coil. Each of these was introduced into the selected arterial branch under constant fluoroscopic guidance. Prior to each detachment, a control arteriogram was performed through the 5 [place] guide catheter to ensure safe position of the coil. Following the deployment of the third coil, complete  occlusion of the arterial branch was noted. The microcatheter was then retrieved and removed. A control arteriogram was then performed through the 5 [place] Navien guide catheter at the origin of the left external carotid artery. There continued to be significant devascularization of the entire tumor mass. Delayed faint tumor stain was noted in the carotid body tumor itself. The Navien guide catheter was removed. A control arteriogram performed through the balloon guide catheter in the left common carotid artery demonstrated no evidence of abnormality in the internal carotid artery intracranially or extra cranially. Intracranially no evidence of intraluminal filling defects or of occlusion was noted. The balloon guide was removed. The 8 French sheath was removed. Hemostasis was achieved at the right groin puncture site. Distal pulses remained palpable in both feet unchanged. Patient was then extubated. Upon recovery, the patient obeyed simple commands appropriately. He denied any headaches, nausea or vomiting. His vision was normal. He was able to move all 4 extremities normally. He was then transferred to the PACU and then neuro ICU. Patient was re-examined in the late afternoon. Neurologically the patient was normal nonfocal. He denied any symptoms of difficulty with swallowing liquids or solids. Patient had complaint of a  slight pain on the left side of his neck. This responded to Tylenol. Patient was then transferred to the vascular surgery service for preparation prior to scheduled surgery on Friday. IMPRESSION: Status post pre-surgical embolization of a large hypervascular carotid body tumor on the left with Onyx 18 and coils achieving approximately 85-90% devascularization. PLAN: As per vascular surgery. Electronically Signed   By: Luanne Bras M.D.   On: 02/24/2021 08:37    Labs:  CBC: Recent Labs    02/20/21 0320 02/21/21 0539 02/22/21 0143 03/06/21 0613  WBC 14.8* 12.5* 13.4* 8.0  HGB 12.3* 12.8* 12.2* 13.3  HCT 37.6* 40.3 38.8* 42.3  PLT 286 310 288 315    COAGS: Recent Labs    12/03/20 1540 12/14/20 2128 02/14/21 0829 03/06/21 0613  INR 1.0 1.0 1.1 1.0  APTT  --   --  31  --     BMP: Recent Labs    02/20/21 0320 02/21/21 0539 02/22/21 0143 03/06/21 0613  NA 137 138 137 139  K 3.7 3.7 3.7 4.1  CL 106 106 104 106  CO2 23 22 25 24   GLUCOSE 115* 96 120* 91  BUN 10 7 7 8   CALCIUM 9.2 9.5 9.3 9.5  CREATININE 1.02 1.10 1.21 1.25*  GFRNONAA >60 >60 >60 >60    LIVER FUNCTION TESTS: Recent Labs    08/25/20 1145 02/14/21 0829  BILITOT 0.7 0.7  AST 28 19  ALT 47* 25  ALKPHOS 62 70  PROT 7.6 7.0  ALBUMIN 4.4 3.8    TUMOR MARKERS: No results for input(s): AFPTM, CEA, CA199, CHROMGRNA in the last 8760 hours.  Assessment and Plan: 40 y.o. male with history of paraganglioma with bilateral carotid body tumor, s/p embolization of the left carotid body tumor with Dr. Estanislado Pandy on 02/19/2021, who presents to [place] today for embolization of the right carotid body tumor with Dr. Estanislado Pandy today.  Patient in stable condition. VSS CBC, INR, RF all stable  Risks and benefits of carotid angiogram with right carotid body tumor embolization were discussed with the patient including, but not limited to bleeding, infection, vascular injury, contrast induced renal failure, stroke  or even death.  This interventional procedure involves the use of X-rays and because of the nature of the planned procedure, it is  possible that we will have prolonged use of X-ray fluoroscopy.  Potential radiation risks to you include (but are not limited to) the following: - A slightly elevated risk for cancer  several years later in life. This risk is typically less than 0.5% percent. This risk is low in comparison to the normal incidence of human cancer, which is 33% for women and 50% for men according to the [place]. - Radiation induced injury can include skin redness, resembling a rash, tissue breakdown / ulcers and hair loss (which can be temporary or permanent).   The likelihood of either of these occurring depends on the difficulty of the procedure and whether you are sensitive to radiation due to previous procedures, disease, or genetic conditions.   IF your procedure requires a prolonged use of radiation, you will be notified and given written instructions for further action.  It is your responsibility to monitor the irradiated area for the 2 weeks following the procedure and to notify your physician if you are concerned that you have suffered a radiation induced injury.    All of the patient's questions were answered, patient is agreeable to proceed.  Consent signed and in chart.   Thank you for this interesting consult.  I greatly enjoyed meeting Kenneth L Kihn Sr. and look forward to participating in their care.  A copy of this report was sent to the requesting provider on this date.  Electronically Signed: Tera Mater, PA-C 03/06/2021, 8:22 AM   I spent a total of    25 Minutes in face to face in clinical consultation, greater than 50% of which was counseling/coordinating care for carotid angiogram with embolization of the right carotid body mass.   This chart was dictated using voice recognition software.  Despite best efforts to proofread,  errors can  occur which can change the documentation meaning.

## 2021-03-06 NOTE — Procedures (Signed)
S/P RT ECA and RT common carotid arteriograms followed by devascularization of RT carotid body tumor superselectively via  RT ascending pharyngeal artery using Onyx 18.  Extubated. Denies any H/As,N/V ,visual  or speech difficulties.  Pupils 73mm RT = LT sluggish. No facial asymmetry. . Tongue midline. Moves all 4s equally. 39F angioseal for hemostasis at the RT groin puncture site. Distal pulses intact. S.Tomica Arseneault MD

## 2021-03-06 NOTE — Anesthesia Procedure Notes (Addendum)
Arterial Line Insertion Start/End11/07/2020 8:20 AM, 03/06/2021 8:29 AM Performed by: Murvin Natal, MD, anesthesiologist  Patient location: Pre-op. Preanesthetic checklist: patient identified, IV checked, site marked, risks and benefits discussed, surgical consent, monitors and equipment checked, pre-op evaluation, timeout performed and anesthesia consent Lidocaine 1% used for infiltration Left, radial was placed Catheter size: 20 G Hand hygiene performed , maximum sterile barriers used  and Seldinger technique used  Attempts: 2 (Previous attempts by CRNA) Procedure performed using ultrasound guided technique. Ultrasound Notes:anatomy identified, needle tip was noted to be adjacent to the nerve/plexus identified and no ultrasound evidence of intravascular and/or intraneural injection Following insertion, Biopatch and dressing applied. Post procedure assessment: unchanged and normal  Post procedure complications: unsuccessful attempts and second provider assisted. Patient tolerated the procedure well with no immediate complications. Additional procedure comments: Unable to thread catheter in the left wrist. Successful placement in the left forearm. Marland Kitchen

## 2021-03-06 NOTE — Progress Notes (Signed)
Patient is S/P RT ECA and RT common carotid arteriograms followed by devascularization of RT carotid body tumor superselectively via  RT ascending pharyngeal artery using Onyx 18.  Patient tolerated procedure well, seen in the room after procedure.  Patient sitting on bed, eating food. Wife ate the bedside.  Reports no complaints, denies HA, right groin pain. Alert, awake, and oriented x 3 Speech and comprehension intact EOMs intact   No facial asymmetry. Tongue deviates to left  Motor power intact x 4  No pronator drift. Fine motor and coordination intact Distal pulses 2+  Right groin puncture site intact, no bleeding, TTP, drainage noted.   Patient states that he has been having numbness on left cheek causing him trouble swallowing after left carotid body mass embolization, but the symptoms are getting better.   Patient is scheduled for bilateral carotid body mass resection tomorrow.  Please call [facility] for any concerns or questions.  Armando Gang Noga Fogg PA-C 03/06/2021 4:04 PM

## 2021-03-07 ENCOUNTER — Encounter (HOSPITAL_COMMUNITY): Payer: Self-pay | Admitting: Interventional Radiology

## 2021-03-07 ENCOUNTER — Inpatient Hospital Stay (HOSPITAL_COMMUNITY): Payer: BC Managed Care – PPO | Admitting: Certified Registered"

## 2021-03-07 ENCOUNTER — Ambulatory Visit (HOSPITAL_COMMUNITY): Admission: RE | Admit: 2021-03-07 | Payer: BC Managed Care – PPO | Source: Home / Self Care | Admitting: Otolaryngology

## 2021-03-07 ENCOUNTER — Other Ambulatory Visit: Payer: Self-pay

## 2021-03-07 ENCOUNTER — Encounter (HOSPITAL_COMMUNITY)
Admission: RE | Disposition: A | Discharge status: Home / Self Care | Payer: Self-pay | Source: Home / Self Care | Attending: Interventional Radiology

## 2021-03-07 DIAGNOSIS — D446 Neoplasm of uncertain behavior of carotid body: Secondary | ICD-10-CM

## 2021-03-07 HISTORY — PX: MASS BIOPSY: SHX5445

## 2021-03-07 LAB — BASIC METABOLIC PANEL
Anion gap: 7 (ref 5–15)
BUN: 7 mg/dL (ref 6–20)
CO2: 24 mmol/L (ref 22–32)
Calcium: 9 mg/dL (ref 8.9–10.3)
Chloride: 107 mmol/L (ref 98–111)
Creatinine, Ser: 1.07 mg/dL (ref 0.61–1.24)
GFR, Estimated: >60 mL/min (ref >60)
Glucose, Bld: 113 mg/dL — ABNORMAL HIGH (ref 70–99)
Potassium: 3.8 mmol/L (ref 3.5–5.1)
Sodium: 138 mmol/L (ref 135–145)

## 2021-03-07 LAB — CBC WITH DIFFERENTIAL/PLATELET
Abs Immature Granulocytes: 0.06 10*3/uL (ref 0.00–0.07)
Basophils Absolute: 0.1 10*3/uL (ref 0.0–0.1)
Basophils Relative: 0 %
Eosinophils Absolute: 0.1 10*3/uL (ref 0.0–0.5)
Eosinophils Relative: 1 %
HCT: 35.5 % — ABNORMAL LOW (ref 39.0–52.0)
Hemoglobin: 11.4 g/dL — ABNORMAL LOW (ref 13.0–17.0)
Immature Granulocytes: 0 %
Lymphocytes Relative: 22 %
Lymphs Abs: 3.1 10*3/uL (ref 0.7–4.0)
MCH: 25.7 pg — ABNORMAL LOW (ref 26.0–34.0)
MCHC: 32.1 g/dL (ref 30.0–36.0)
MCV: 80.1 fL (ref 80.0–100.0)
Monocytes Absolute: 0.8 10*3/uL (ref 0.1–1.0)
Monocytes Relative: 5 %
Neutro Abs: 10.3 10*3/uL — ABNORMAL HIGH (ref 1.7–7.7)
Neutrophils Relative %: 72 %
Platelets: 297 10*3/uL (ref 150–400)
RBC: 4.43 MIL/uL (ref 4.22–5.81)
RDW: 16.6 % — ABNORMAL HIGH (ref 11.5–15.5)
WBC: 14.4 10*3/uL — ABNORMAL HIGH (ref 4.0–10.5)
nRBC: 0 % (ref 0.0–0.2)

## 2021-03-07 SURGERY — BIOPSY, MASS, NECK
Anesthesia: General | Site: Neck | Laterality: Right

## 2021-03-07 MED ORDER — POTASSIUM CHLORIDE CRYS ER 20 MEQ PO TBCR
20.0000 meq | EXTENDED_RELEASE_TABLET | Freq: Every day | ORAL | Status: DC | PRN
Start: 2021-03-07 — End: 2021-03-08

## 2021-03-07 MED ORDER — DOCUSATE SODIUM 100 MG PO CAPS: 100.0000 mg | ORAL_CAPSULE | Freq: Every day | ORAL | Status: DC

## 2021-03-07 MED ORDER — MORPHINE SULFATE (PF) 2 MG/ML IV SOLN
INTRAVENOUS | Status: AC
  Filled 2021-03-07: qty 1

## 2021-03-07 MED ORDER — PHENOL 1.4 % MT LIQD: 1.0000 | OROMUCOSAL | Status: DC | PRN

## 2021-03-07 MED ORDER — HEMOSTATIC AGENTS (NO CHARGE) OPTIME
TOPICAL | Status: DC | PRN
  Administered 2021-03-07: 1 via TOPICAL

## 2021-03-07 MED ORDER — BISACODYL 10 MG RE SUPP: 10.0000 mg | Freq: Every day | RECTAL | Status: DC | PRN

## 2021-03-07 MED ORDER — MEPERIDINE HCL 25 MG/ML IJ SOLN: 6.2500 mg | INTRAMUSCULAR | Status: DC | PRN

## 2021-03-07 MED ORDER — HYDROMORPHONE HCL 1 MG/ML IJ SOLN
0.2500 mg | INTRAMUSCULAR | Status: DC | PRN
  Administered 2021-03-07 (×2): 0.5 mg via INTRAVENOUS

## 2021-03-07 MED ORDER — HYDRALAZINE HCL 20 MG/ML IJ SOLN
INTRAMUSCULAR | Status: AC
  Administered 2021-03-07: 5 mg
  Filled 2021-03-07: qty 1

## 2021-03-07 MED ORDER — MIDAZOLAM HCL 2 MG/2ML IJ SOLN
INTRAMUSCULAR | Status: AC
  Filled 2021-03-07: qty 2

## 2021-03-07 MED ORDER — PROPOFOL 10 MG/ML IV BOLUS
INTRAVENOUS | Status: AC
  Filled 2021-03-07: qty 20

## 2021-03-07 MED ORDER — PROPOFOL 10 MG/ML IV BOLUS
INTRAVENOUS | Status: DC | PRN
  Administered 2021-03-07: 100 mg via INTRAVENOUS

## 2021-03-07 MED ORDER — SODIUM CHLORIDE 0.9 % IV SOLN: INTRAVENOUS | Status: DC

## 2021-03-07 MED ORDER — SODIUM CHLORIDE 0.9 % IV SOLN: 500.0000 mL | Freq: Once | INTRAVENOUS | Status: DC | PRN

## 2021-03-07 MED ORDER — SUGAMMADEX SODIUM 200 MG/2ML IV SOLN
INTRAVENOUS | Status: DC | PRN
  Administered 2021-03-07: 301.2 mg via INTRAVENOUS

## 2021-03-07 MED ORDER — PHENYLEPHRINE 40 MCG/ML (10ML) SYRINGE FOR IV PUSH (FOR BLOOD PRESSURE SUPPORT)
PREFILLED_SYRINGE | INTRAVENOUS | Status: AC
  Filled 2021-03-07: qty 10

## 2021-03-07 MED ORDER — PROMETHAZINE HCL 25 MG/ML IJ SOLN: 6.2500 mg | INTRAMUSCULAR | Status: DC | PRN

## 2021-03-07 MED ORDER — HYDRALAZINE HCL 20 MG/ML IJ SOLN
5.0000 mg | INTRAMUSCULAR | Status: AC | PRN
  Administered 2021-03-07 (×2): 5 mg via INTRAVENOUS
  Filled 2021-03-07 (×3): qty 0.25

## 2021-03-07 MED ORDER — ALUM & MAG HYDROXIDE-SIMETH 200-200-20 MG/5ML PO SUSP: 15.0000 mL | ORAL | Status: DC | PRN

## 2021-03-07 MED ORDER — MIDAZOLAM HCL 2 MG/2ML IJ SOLN
INTRAMUSCULAR | Status: DC | PRN
  Administered 2021-03-07: 2 mg via INTRAVENOUS

## 2021-03-07 MED ORDER — FENTANYL CITRATE (PF) 250 MCG/5ML IJ SOLN
INTRAMUSCULAR | Status: DC | PRN
  Administered 2021-03-07 (×4): 50 ug via INTRAVENOUS

## 2021-03-07 MED ORDER — DEXAMETHASONE SODIUM PHOSPHATE 10 MG/ML IJ SOLN
INTRAMUSCULAR | Status: DC | PRN
Start: 2021-03-07 — End: 2021-03-07
  Administered 2021-03-07: 10 mg via INTRAVENOUS

## 2021-03-07 MED ORDER — POLYETHYLENE GLYCOL 3350 17 G PO PACK: 17.0000 g | PACK | Freq: Every day | ORAL | Status: DC | PRN

## 2021-03-07 MED ORDER — ONDANSETRON HCL 4 MG/2ML IJ SOLN
INTRAMUSCULAR | Status: AC
  Filled 2021-03-07: qty 2

## 2021-03-07 MED ORDER — METOPROLOL TARTRATE 5 MG/5ML IV SOLN
2.0000 mg | INTRAVENOUS | Status: DC | PRN
Start: 2021-03-07 — End: 2021-03-08
  Administered 2021-03-07: 5 mg via INTRAVENOUS
  Filled 2021-03-07 (×2): qty 5

## 2021-03-07 MED ORDER — ONDANSETRON HCL 4 MG/2ML IJ SOLN
INTRAMUSCULAR | Status: DC | PRN
  Administered 2021-03-07: 4 mg via INTRAVENOUS

## 2021-03-07 MED ORDER — ACETAMINOPHEN 325 MG PO TABS
325.0000 mg | ORAL_TABLET | ORAL | Status: DC | PRN
  Administered 2021-03-07 (×2): 650 mg via ORAL
  Filled 2021-03-07 (×2): qty 2

## 2021-03-07 MED ORDER — ACETAMINOPHEN 650 MG RE SUPP: 325.0000 mg | RECTAL | Status: DC | PRN

## 2021-03-07 MED ORDER — ONDANSETRON HCL 4 MG/2ML IJ SOLN
4.0000 mg | Freq: Four times a day (QID) | INTRAMUSCULAR | Status: DC | PRN
  Administered 2021-03-07: 4 mg via INTRAVENOUS
  Filled 2021-03-07: qty 2

## 2021-03-07 MED ORDER — MORPHINE SULFATE (PF) 2 MG/ML IV SOLN
2.0000 mg | INTRAVENOUS | Status: DC | PRN
  Administered 2021-03-07 (×2): 2 mg via INTRAVENOUS
  Filled 2021-03-07 (×2): qty 1

## 2021-03-07 MED ORDER — PHENYLEPHRINE HCL-NACL 20-0.9 MG/250ML-% IV SOLN
INTRAVENOUS | Status: DC | PRN
  Administered 2021-03-07: 50 ug/min via INTRAVENOUS

## 2021-03-07 MED ORDER — OXYCODONE HCL 5 MG PO TABS: 5.0000 mg | ORAL_TABLET | Freq: Once | ORAL | Status: DC | PRN

## 2021-03-07 MED ORDER — ROCURONIUM BROMIDE 10 MG/ML (PF) SYRINGE
PREFILLED_SYRINGE | INTRAVENOUS | Status: DC | PRN
  Administered 2021-03-07: 20 mg via INTRAVENOUS
  Administered 2021-03-07: 80 mg via INTRAVENOUS

## 2021-03-07 MED ORDER — LIDOCAINE 2% (20 MG/ML) 5 ML SYRINGE
INTRAMUSCULAR | Status: DC | PRN
  Administered 2021-03-07: 80 mg via INTRAVENOUS

## 2021-03-07 MED ORDER — METOPROLOL TARTRATE 5 MG/5ML IV SOLN
INTRAVENOUS | Status: AC
  Filled 2021-03-07: qty 5

## 2021-03-07 MED ORDER — LIDOCAINE 2% (20 MG/ML) 5 ML SYRINGE
INTRAMUSCULAR | Status: AC
  Filled 2021-03-07: qty 5

## 2021-03-07 MED ORDER — PROPOFOL 1000 MG/100ML IV EMUL
INTRAVENOUS | Status: AC
  Filled 2021-03-07: qty 100

## 2021-03-07 MED ORDER — HYDROMORPHONE HCL 1 MG/ML IJ SOLN
INTRAMUSCULAR | Status: AC
  Filled 2021-03-07: qty 1

## 2021-03-07 MED ORDER — CLEVIDIPINE BUTYRATE 0.5 MG/ML IV EMUL
0.0000 mg/h | INTRAVENOUS | Status: DC
  Administered 2021-03-07: 17 mg/h via INTRAVENOUS

## 2021-03-07 MED ORDER — OXYCODONE-ACETAMINOPHEN 5-325 MG PO TABS
1.0000 | ORAL_TABLET | ORAL | Status: DC | PRN
  Administered 2021-03-07 – 2021-03-08 (×2): 2 via ORAL
  Filled 2021-03-07 (×2): qty 2

## 2021-03-07 MED ORDER — WHITE PETROLATUM EX OINT
TOPICAL_OINTMENT | CUTANEOUS | Status: AC
  Filled 2021-03-07: qty 28.35

## 2021-03-07 MED ORDER — FENTANYL CITRATE (PF) 250 MCG/5ML IJ SOLN
INTRAMUSCULAR | Status: AC
  Filled 2021-03-07: qty 5

## 2021-03-07 MED ORDER — MAGNESIUM SULFATE 2 GM/50ML IV SOLN: 2.0000 g | Freq: Every day | INTRAVENOUS | Status: DC | PRN

## 2021-03-07 MED ORDER — ROCURONIUM BROMIDE 10 MG/ML (PF) SYRINGE
PREFILLED_SYRINGE | INTRAVENOUS | Status: AC
  Filled 2021-03-07: qty 10

## 2021-03-07 MED ORDER — CHLORHEXIDINE GLUCONATE CLOTH 2 % EX PADS: 6.0000 | MEDICATED_PAD | Freq: Every day | CUTANEOUS | Status: DC

## 2021-03-07 MED ORDER — CEFAZOLIN SODIUM-DEXTROSE 2-4 GM/100ML-% IV SOLN
2.0000 g | Freq: Three times a day (TID) | INTRAVENOUS | Status: AC
  Administered 2021-03-07 (×2): 2 g via INTRAVENOUS
  Filled 2021-03-07 (×2): qty 100

## 2021-03-07 MED ORDER — GUAIFENESIN-DM 100-10 MG/5ML PO SYRP: 15.0000 mL | ORAL_SOLUTION | ORAL | Status: DC | PRN

## 2021-03-07 MED ORDER — AMISULPRIDE (ANTIEMETIC) 5 MG/2ML IV SOLN: 10.0000 mg | Freq: Once | INTRAVENOUS | Status: DC | PRN

## 2021-03-07 MED ORDER — DEXAMETHASONE SODIUM PHOSPHATE 10 MG/ML IJ SOLN
INTRAMUSCULAR | Status: AC
  Filled 2021-03-07: qty 1

## 2021-03-07 MED ORDER — LABETALOL HCL 5 MG/ML IV SOLN
10.0000 mg | INTRAVENOUS | Status: DC | PRN
  Administered 2021-03-07 (×2): 10 mg via INTRAVENOUS
  Filled 2021-03-07 (×3): qty 4

## 2021-03-07 MED ORDER — PHENYLEPHRINE HCL (PRESSORS) 10 MG/ML IV SOLN
INTRAVENOUS | Status: DC | PRN
  Administered 2021-03-07 (×2): 100 ug via INTRAVENOUS
  Administered 2021-03-07: 80 ug via INTRAVENOUS
  Administered 2021-03-07: 100 ug via INTRAVENOUS
  Administered 2021-03-07: 200 ug via INTRAVENOUS

## 2021-03-07 MED ORDER — PANTOPRAZOLE SODIUM 40 MG PO TBEC: 40.0000 mg | DELAYED_RELEASE_TABLET | Freq: Every day | ORAL | Status: DC

## 2021-03-07 MED ORDER — OXYCODONE HCL 5 MG/5ML PO SOLN: 5.0000 mg | Freq: Once | ORAL | Status: DC | PRN

## 2021-03-07 MED ORDER — HEPARIN SODIUM (PORCINE) 5000 UNIT/ML IJ SOLN: 5000.0000 [IU] | Freq: Three times a day (TID) | INTRAMUSCULAR | Status: DC

## 2021-03-07 MED ORDER — 0.9 % SODIUM CHLORIDE (POUR BTL) OPTIME
TOPICAL | Status: DC | PRN
  Administered 2021-03-07: 1000 mL

## 2021-03-07 SURGICAL SUPPLY — 38 items
ADH SKN CLS APL DERMABOND .7 (GAUZE/BANDAGES/DRESSINGS) ×1
AGENT HMST KT MTR STRL THRMB (HEMOSTASIS) ×1
BLADE SURG 15 STRL LF DISP TIS (BLADE) IMPLANT
BLADE SURG 15 STRL SS (BLADE) ×2
CANISTER SUCT 3000ML PPV (MISCELLANEOUS) ×1 IMPLANT
CLEANER TIP ELECTROSURG 2X2 (MISCELLANEOUS) ×2 IMPLANT
CLIP TI MEDIUM 24 (CLIP) ×1 IMPLANT
CORD BIPOLAR FORCEPS 12FT (ELECTRODE) ×1 IMPLANT
COVER SURGICAL LIGHT HANDLE (MISCELLANEOUS) ×2 IMPLANT
DERMABOND ADVANCED (GAUZE/BANDAGES/DRESSINGS) ×1
DERMABOND ADVANCED .7 DNX12 (GAUZE/BANDAGES/DRESSINGS) IMPLANT
DRAIN JP 10F RND RADIO (DRAIN) ×1 IMPLANT
ELECT COATED BLADE 2.86 ST (ELECTRODE) ×2 IMPLANT
ELECT REM PT RETURN 9FT ADLT (ELECTROSURGICAL) ×2
ELECTRODE REM PT RTRN 9FT ADLT (ELECTROSURGICAL) ×1 IMPLANT
EVACUATOR SILICONE 100CC (DRAIN) ×1 IMPLANT
FORCEPS BIPOLAR SPETZLER 8 1.0 (NEUROSURGERY SUPPLIES) ×1 IMPLANT
GLOVE SURG LTX SZ7.5 (GLOVE) ×2 IMPLANT
GOWN STRL REUS W/ TWL XL LVL3 (GOWN DISPOSABLE) ×2 IMPLANT
GOWN STRL REUS W/TWL XL LVL3 (GOWN DISPOSABLE) ×4
KIT BASIN OR (CUSTOM PROCEDURE TRAY) ×2 IMPLANT
KIT TURNOVER KIT B (KITS) ×2 IMPLANT
LOOP VESSEL MAXI BLUE (MISCELLANEOUS) ×1 IMPLANT
NDL PRECISIONGLIDE 27X1.5 (NEEDLE) IMPLANT
NEEDLE PRECISIONGLIDE 27X1.5 (NEEDLE) ×2 IMPLANT
NS IRRIG 1000ML POUR BTL (IV SOLUTION) ×2 IMPLANT
PAD ARMBOARD 7.5X6 YLW CONV (MISCELLANEOUS) ×4 IMPLANT
PENCIL FOOT CONTROL (ELECTRODE) ×2 IMPLANT
SET WALTER ACTIVATION W/DRAPE (SET/KITS/TRAYS/PACK) ×1 IMPLANT
SURGIFLO W/THROMBIN 8M KIT (HEMOSTASIS) ×1 IMPLANT
SUT CHROMIC 3 0 PS 2 (SUTURE) ×1 IMPLANT
SUT CHROMIC 4 0 P 3 18 (SUTURE) ×1 IMPLANT
SUT ETHILON 4 0 PS 2 18 (SUTURE) ×2 IMPLANT
SUT PROLENE 5 0 C 1 24 (SUTURE) ×2 IMPLANT
SUT SILK 4 0 REEL (SUTURE) ×1 IMPLANT
TOWEL GREEN STERILE FF (TOWEL DISPOSABLE) ×2 IMPLANT
TRAY ENT MC OR (CUSTOM PROCEDURE TRAY) ×2 IMPLANT
YANKAUER SUCT BULB TIP NO VENT (SUCTIONS) IMPLANT

## 2021-03-07 NOTE — Discharge Instructions (Signed)
Carotid body tumor resection, Care After Refer to this sheet in the next few weeks. These instructions provide you with information about caring for yourself after your procedure. Your health care provider may also give you more specific instructions. Your treatment has been planned according to current medical practices, but problems sometimes occur. Call your health care provider if you have any problems or questions after your procedure. What can I expect after the procedure? After your procedure, it is typical to have: Mild pain in the neck or upper body, especially when swallowing. A sore throat. A weak voice.   Follow these instructions at home: Take medicines only as directed by your health care provider. Do not take medicines that contain aspirin and ibuprofen until your health care provider says that you can. These medicines can increase your risk of bleeding. Some pain medicines cause constipation. Drink enough fluid to keep your urine clear or pale yellow. This can help to prevent constipation. Start slowly with eating. You may need to have only liquids and soft foods for a few days or as directed by your health care provider. Resume your usual activities as directed by your health care provider. For the first 10 days after the procedure or as instructed by your health care provider: Do not lift anything heavier than 20 lb (9.1 kg). Do not jog, swim, or do other strenuous exercises. Do not play contact sports. Keep all follow-up visits as directed by your health care provider. This is important. Contact a health care provider if: The soreness in your throat gets worse. You have increased pain at your incision or incisions. You have increased bleeding from an incision. Your incision becomes infected. Watch for: Swelling. Redness. Warmth. Pus. You notice a bad smell coming from an incision or dressing. You have a fever. You feel lightheaded or faint. You have numbness,  tingling, or weakness in your: Arms. Hands. Feet. Face. You have trouble swallowing. Get help right away if: You develop a rash. You have difficulty breathing. You hear whistling noises coming from your chest. You develop a cough that gets worse. Your speech changes, or you have hoarseness that gets worse. This information is not intended to replace advice given to you by your health care provider. Make sure you discuss any questions you have with your health care provider. Document Released: 11/07/2004 Document Revised: 12/22/2015 Document Reviewed: 09/20/2013 Elsevier Interactive Patient Education  2018 [employer].

## 2021-03-07 NOTE — Op Note (Signed)
    Patient name: LEVAR FAYSON Sr. MRN: [id number] DOB: 1981/01/18 Sex: male  03/07/2021 Pre-operative Diagnosis: Right carotid body tumor Post-operative diagnosis:  Same Surgeon:  Annamarie Major, [place] Care Procedure: #1: Resection of right carotid body tumor   #2: Primary repair of internal carotid artery injury Anesthesia:  General Blood Loss:  200 cc Specimens: Right carotid body tumor to pathology  Findings: During resection of the tumor, a feeding vessel off of the internal carotid artery was removed causing a slight rent at the carotid bifurcation onto the medial aspect of the internal carotid artery which was repaired with a 5-0 Prolene suture  Indications: This is a 40 year old gentleman who recently underwent resection of a left carotid body tumor who comes in for resection of the right side.  Procedure:  The patient was identified in the holding area and taken to [facility]  The patient was then placed supine on the table. general anesthesia was administered.  The patient was prepped and draped in the usual sterile fashion.  A time out was called and antibiotics were administered.  Please see Dr. Janeice Robinson operative note for full details of the procedure.  During removal of the tumor, a rent was made in the medial aspect of the internal carotid artery, likely from avulsion of a feeder vessel to the tumor.  In order to get control of this, a vessel loop was placed on the common carotid artery and occluded, this slowed the bleeding down so that I could repair the area with 2 5-0 Prolene sutures.  Once the tumor was resected and sent to pathology, I evaluated the common, internal, and external carotid arteries with hand-held Doppler which all had appropriate signals.   Disposition: To PACU stable   V. Annamarie Major, M.D., [facility] Office: [phone] Pager:  [phone]

## 2021-03-07 NOTE — Op Note (Signed)
OPERATIVE REPORT   DATE OF SURGERY: 03/07/2021   PATIENT:  Kenneth Rumps Sr.,  40 y.o. male   PRE-OPERATIVE DIAGNOSIS:  RIGHT CAROTID BODY PARAGANGLIOMA   POST-OPERATIVE DIAGNOSIS:  RIGHT CAROTID BODY PARAGANGLIOMA   PROCEDURE:  Procedure(s): TUMOR EXCISION RIGHT CAROTID BODY   SURGEON:  Beckie Salts, MD, Annamarie Major, MD   ASSISTANTS: None     ANESTHESIA:   General    EBL: 100 mL ml   DRAINS: 10 [place] round JP   LOCAL MEDICATIONS USED:  None   SPECIMEN: Right carotid body paraganglioma and a right level 2 cervical lymph node   COUNTS:  Correct   PROCEDURE DETAILS: The patient was taken to the operating room and placed on the operating table in the supine position. Following induction of general endotracheal anesthesia, the right neck was prepped and draped in a standard fashion.  A curvilinear incision was outlined with a marking pen about 2 fingerbreadths below the angle of the mandible.  Electrocautery was used to incise the skin and subcutaneous tissue.  Electrocautery was then used for hemostasis and to divide the platysma layer.  A subplatysmal flap was developed superiorly for about 3 cm.  Fibrofatty tissue was dissected off of the upper sternocleidomastoid muscle and reflected anteriorly.  This was then continued with additional superficial fascia that contained the mandibular branch of the facial nerve and was reflected superiorly.  Blunt dissection between the sternocleidomastoid muscle and the submandibular gland revealed the internal jugular vein and then the common carotid artery.  The artery was dissected up superiorly identifying the bifurcation.  The vagus nerve was identified and preserved.  The tumor was identified posterior to the bifurcation.  Multiple feeder vessels were ligated using bipolar cautery or vascular clips.  Bipolar cautery was used for smaller vascular structures.  One of the feeder vessels from the application was transected off of the  bifurcation and this required repair by Dr. Trula Slade.  The common carotid was reflected anteriorly using a vascular loop which facilitated exposure of the tumor and the feeder vessels and for repair of the bifurcation.  The entire tumor was removed and sent for pathologic valuation.  Hemostasis was completed.  The hypoglossal nerve was also identified and preserved during the dissection.  Surgi-Flo was applied to the surgical bed.  The drain was exited through separate stab incision secured in place with silk suture.  Platysma layer was reapproximated with interrupted 3-0 chromic.  A running 3-0 chromic subcuticular closure was accomplished and Dermabond was used on the skin.  Patient was then awakened extubated and transferred recovery in stable condition       PATIENT DISPOSITION:  To PACU, stable

## 2021-03-07 NOTE — Interval H&P Note (Signed)
History and Physical Interval Note:  03/07/2021 7:20 AM  Kenneth L Lovecchio Sr.  has presented today for surgery, with the diagnosis of CAROTID BODY PARAGANGLIOMA.  The various methods of treatment have been discussed with the patient and family. After consideration of risks, benefits and other options for treatment, the patient has consented to  Procedure(s): RIGHT CAROTID BODY TUMOR EXCISION (Right) as a surgical intervention.  The patient's history has been reviewed, patient examined, no change in status, stable for surgery.  I have reviewed the patient's chart and labs.  Questions were answered to the patient's satisfaction.     Izora Gala

## 2021-03-07 NOTE — Transfer of Care (Signed)
Immediate Anesthesia Transfer of Care Note  Patient: Kenneth L Chastain Sr.  Procedure(s) Performed: RIGHT CAROTID BODY TUMOR EXCISION (Right: Neck)  Patient Location: PACU  Anesthesia Type:General  Level of Consciousness: awake, alert , oriented and patient cooperative  Airway & Oxygen Therapy: Patient Spontanous Breathing and Patient connected to face mask oxygen  Post-op Assessment: Report given to RN, Post -op Vital signs reviewed and stable and Patient moving all extremities X 4  Post vital signs: Reviewed and stable  Last Vitals:  Vitals Value Taken Time  BP 174/108 03/07/21 0938  Temp    Pulse 116 03/07/21 0943  Resp 21 03/07/21 0943  SpO2 100 % 03/07/21 0943  Vitals shown include unvalidated device data. Pt resarted on Cleviprex in PACU.  Last Pain:  Vitals:   03/07/21 0400  TempSrc: Oral  PainSc: 0-No pain      Patients Stated Pain Goal: 0 (05/39/76 7341)  Complications: No notable events documented.

## 2021-03-07 NOTE — Progress Notes (Signed)
Patient ID: Kenneth Blackwell., male   DOB: 1981/03/07, 40 y.o.   MRN: [id number]  Postop check  Having blood pressure issues but otherwise doing well.  He is resting comfortably.  Surgical site looks excellent without evidence of hematoma.  Drain is functioning.  Cranial nerves are all functioning properly.  Satisfactory postop check.  Continue overnight observation, manage blood pressure.

## 2021-03-07 NOTE — Anesthesia Procedure Notes (Signed)
Procedure Name: Intubation Date/Time: 03/07/2021 7:46 AM Performed by: Annamary Carolin, CRNA Pre-anesthesia Checklist: Patient identified, Emergency Drugs available, Suction available and Patient being monitored Patient Re-evaluated:Patient Re-evaluated prior to induction Oxygen Delivery Method: Circle System Utilized Preoxygenation: Pre-oxygenation with 100% oxygen Induction Type: IV induction Ventilation: Mask ventilation without difficulty Laryngoscope Size: Mac and 4 Grade View: Grade I Tube type: Oral Number of attempts: 1 Airway Equipment and Method: Stylet Placement Confirmation: ETT inserted through vocal cords under direct vision, positive ETCO2 and breath sounds checked- equal and bilateral Secured at: 23 cm Tube secured with: Tape Dental Injury: Teeth and Oropharynx as per pre-operative assessment

## 2021-03-07 NOTE — Anesthesia Preprocedure Evaluation (Addendum)
Anesthesia Evaluation  Patient identified by MRN, date of birth, ID band Patient awake    Reviewed: Allergy & Precautions, NPO status , Patient's Chart, lab work & pertinent test results  History of Anesthesia Complications Negative for: history of anesthetic complications  Airway Mallampati: II  TM Distance: >3 FB Neck ROM: Full    Dental no notable dental hx. (+) Dental Advisory Given   Pulmonary former smoker,    Pulmonary exam normal        Cardiovascular negative cardio ROS Normal cardiovascular exam     Neuro/Psych negative neurological ROS  negative psych ROS   GI/Hepatic Neg liver ROS, GERD  Medicated,  Endo/Other  negative endocrine ROS  Renal/GU negative Renal ROS     Musculoskeletal negative musculoskeletal ROS (+)   Abdominal Normal abdominal exam  (+)   Peds  Hematology negative hematology ROS (+)   Anesthesia Other Findings   Reproductive/Obstetrics                             Anesthesia Physical  Anesthesia Plan  ASA: 2  Anesthesia Plan: General   Post-op Pain Management:    Induction: Intravenous  PONV Risk Score and Plan: 2 and Ondansetron, Midazolam and Treatment may vary due to age or medical condition  Airway Management Planned: Oral ETT  Additional Equipment: Arterial line  Intra-op Plan:   Post-operative Plan: Extubation in OR  Informed Consent: I have reviewed the patients History and Physical, chart, labs and discussed the procedure including the risks, benefits and alternatives for the proposed anesthesia with the patient or authorized representative who has indicated his/her understanding and acceptance.     Dental advisory given  Plan Discussed with: Anesthesiologist and CRNA  Anesthesia Plan Comments:        Anesthesia Quick Evaluation

## 2021-03-07 NOTE — Anesthesia Postprocedure Evaluation (Signed)
Anesthesia Post Note  Patient: Kenneth L Placencia Sr.  Procedure(s) Performed: RIGHT CAROTID BODY TUMOR EXCISION (Right: Neck)     Patient location during evaluation: PACU Anesthesia Type: General Level of consciousness: awake and alert Pain management: pain level controlled Vital Signs Assessment: post-procedure vital signs reviewed and stable Respiratory status: spontaneous breathing, nonlabored ventilation and respiratory function stable Cardiovascular status: blood pressure returned to baseline and stable Postop Assessment: no apparent nausea or vomiting Anesthetic complications: no   No notable events documented.  Last Vitals:  Vitals:   03/07/21 1140 03/07/21 1210  BP: 112/66   Pulse: (!) 102   Resp: 18 18  Temp:  (!) 36.4 C  SpO2: 94%     Last Pain:  Vitals:   03/07/21 1210  TempSrc: Oral  PainSc: 0-No pain                 Herve Haug,W. EDMOND

## 2021-03-08 ENCOUNTER — Encounter (HOSPITAL_COMMUNITY): Payer: Self-pay | Admitting: Otolaryngology

## 2021-03-08 LAB — LIPID PANEL
Cholesterol: 130 mg/dL (ref 0–200)
HDL: 52 mg/dL (ref >40)
LDL Cholesterol: 53 mg/dL (ref 0–99)
Total CHOL/HDL Ratio: 2.5 RATIO
Triglycerides: 123 mg/dL (ref <150)
VLDL: 25 mg/dL (ref 0–40)

## 2021-03-08 LAB — CBC
HCT: 37.8 % — ABNORMAL LOW (ref 39.0–52.0)
Hemoglobin: 12.1 g/dL — ABNORMAL LOW (ref 13.0–17.0)
MCH: 25.9 pg — ABNORMAL LOW (ref 26.0–34.0)
MCHC: 32 g/dL (ref 30.0–36.0)
MCV: 80.9 fL (ref 80.0–100.0)
Platelets: 278 10*3/uL (ref 150–400)
RBC: 4.67 MIL/uL (ref 4.22–5.81)
RDW: 17 % — ABNORMAL HIGH (ref 11.5–15.5)
WBC: 14.8 10*3/uL — ABNORMAL HIGH (ref 4.0–10.5)
nRBC: 0 % (ref 0.0–0.2)

## 2021-03-08 LAB — BASIC METABOLIC PANEL
Anion gap: 10 (ref 5–15)
BUN: 8 mg/dL (ref 6–20)
CO2: 23 mmol/L (ref 22–32)
Calcium: 9.4 mg/dL (ref 8.9–10.3)
Chloride: 104 mmol/L (ref 98–111)
Creatinine, Ser: 1.17 mg/dL (ref 0.61–1.24)
GFR, Estimated: >60 mL/min (ref >60)
Glucose, Bld: 106 mg/dL — ABNORMAL HIGH (ref 70–99)
Potassium: 4 mmol/L (ref 3.5–5.1)
Sodium: 137 mmol/L (ref 135–145)

## 2021-03-08 MED ORDER — OXYCODONE-ACETAMINOPHEN 5-325 MG PO TABS
1.0000 | ORAL_TABLET | ORAL | 0 refills | Status: DC | PRN
Start: 2021-03-08 — End: 2022-11-11

## 2021-03-08 NOTE — Progress Notes (Signed)
Patient ID: [name], male   DOB: Aug 31, 1980, 40 y.o.   MRN: [id number] Subjective: Had some headache last night but otherwise doing very well.  Objective: Vital signs in last 24 hours: Temp:  [97.5 F (36.4 C)-98.5 F (36.9 C)] 98.5 F (36.9 C) (11/05 0549) Pulse Rate:  [80-115] 80 (11/05 0549) Resp:  [14-22] 14 (11/05 0549) BP: (111-166)/(66-107) 147/98 (11/05 0549) SpO2:  [94 %-100 %] 97 % (11/05 0549) Arterial Line BP: (115-165)/(53-77) 141/63 (11/04 1140) Weight change:  Last BM Date: 03/05/21  Intake/Output from previous day: 11/04 0701 - 11/05 0700 In: 1797.5 [P.O.:240; I.V.:1357.5; IV Piggyback:200] Out: 0174 [Urine:4100; Drains:18; Blood:25] Intake/Output this shift: No intake/output data recorded.  PHYSICAL EXAM: Awake and alert.  Slight fullness of the right neck but no tenseness or obvious hematoma.  Drain removed and a dressing applied.  Lab Results: Recent Labs    03/07/21 0514 03/08/21 0543  WBC 14.4* 14.8*  HGB 11.4* 12.1*  HCT 35.5* 37.8*  PLT 297 278   BMET Recent Labs    03/07/21 0514 03/08/21 0543  NA 138 137  K 3.8 4.0  CL 107 104  CO2 24 23  GLUCOSE 113* 106*  BUN 7 8  CREATININE 1.07 1.17  CALCIUM 9.0 9.4    Studies/Results: No results found.  Medications: I have reviewed the patient's current medications.  Assessment/Plan: Stable postop.  May be discharged home from a surgical perspective.  LOS: 2 days   Izora Gala 03/08/2021, 9:22 AM

## 2021-03-08 NOTE — Progress Notes (Addendum)
A line and Foley cath removed as ordered. Procedures well tolerated.

## 2021-03-08 NOTE — Discharge Summary (Signed)
Carotid Discharge Summary     Kenneth BAHL Sr. 15-Nov-1980 40 y.o. 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  The pt tolerated the procedure well and was transported to the PACU in excellent condition. He had mild hypertension which was controlled with PO and as needed beta blocker.  He developed left frontal headache that resolved with Percocet.   By POD 1, the pt neuro status baseline. His vital signs were stable. He was voiding spontaneously and tolerating his diet. He had some mild fullness of his right neck incision that was stable. He had scant drain output and this was discontinued.  The remainder of the hospital course consisted of increasing mobilization and increasing intake of solids without difficulty.   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]  Secondary Diagnosis: Patient Active Problem List   Diagnosis Date Noted   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   Carotid body tumor ([place]) 02/19/2021   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   Genetic testing 57/32/2025   Monoallelic mutation of  SDHD gene 10/01/2020   Retroperitoneal mass 09/24/2020   Family history of pheochromocytoma 09/09/2020   Family history of pancreatic cancer 09/09/2020   Abdominal pain 08/25/2020   Lumbar radiculopathy 04/30/2020   Low back pain 04/03/2020   Past Medical History:  Diagnosis Date   Family history of pancreatic cancer 09/09/2020   Family history of pheochromocytoma 09/09/2020   Lumbar herniated disc    Monoallelic mutation of SDHD gene 10/01/2020    Allergies as of 03/08/2021   No Known Allergies      Medication List     STOP taking these medications    oxyCODONE 5 MG immediate release tablet Commonly known as: Oxy IR/ROXICODONE       TAKE these medications    acetaminophen 500 MG tablet Commonly known as: TYLENOL Take 2 tablets (1,000 mg total) by mouth every 8 (eight) hours as needed for mild pain.   docusate sodium 100 MG capsule Commonly known as: COLACE Take 1 capsule (100 mg total) by mouth 2 (two) times daily.   ibuprofen 800 MG tablet Commonly known as: ADVIL Take 800 mg by mouth every 8 (eight) hours as needed for headache or moderate pain.   methocarbamol 500 MG tablet Commonly known as: ROBAXIN Take 1 tablet (500 mg total) by mouth every 6 (six) hours as needed for muscle spasms.   omeprazole 40 MG capsule Commonly known as: PRILOSEC Take 1 capsule (40 mg  total) by mouth daily.   oxyCODONE-acetaminophen 5-325 MG tablet Commonly known as: PERCOCET/ROXICET Take 1-2 tablets by mouth every 4 (four) hours as needed for moderate pain. For acute post-operative pain   polyethylene glycol 17 g packet Commonly known as: MIRALAX / GLYCOLAX Take 17 g by mouth daily as needed (constipation).   rosuvastatin 40 MG tablet Commonly known as: CRESTOR Take 1 tablet (40 mg total) by mouth daily.         Discharge Instructions:   [facility] Discharge Instructions Carotid Endarterectomy (CEA)  Please refer to the  following instructions for your post-procedure care. Your surgeon or physician assistant will discuss any changes with you.  Activity  You are encouraged to walk as much as you can. You can slowly return to normal activities but must avoid strenuous activity and heavy lifting until your doctor tell you it's OK. Avoid activities such as vacuuming or swinging a golf club. You can drive after one week if you are comfortable and you are no longer taking prescription pain medications. It is normal to feel tired for serval weeks after your surgery. It is also normal to have difficulty with sleep habits, eating, and bowel movements after surgery. These will go away with time.  Bathing/Showering  You may shower after you come home. Do not soak in a bathtub, hot tub, or swim until the incision heals completely.  Incision Care  Shower every day. Clean your incision with mild soap and water. Pat the area dry with a clean towel. You do not need a bandage unless otherwise instructed. Do not apply any ointments or creams to your incision. You may have skin glue on your incision. Do not peel it off. It will come off on its own in about one week. Your incision may feel thickened and raised for several weeks after your surgery. This is normal and the skin will soften over time. For Men Only: It's OK to shave around the incision but do not shave the incision itself for 2 weeks. It is common to have numbness under your chin that could last for several months.  Diet  Resume your normal diet. There are no special food restrictions following this procedure. A low fat/low cholesterol diet is recommended for all patients with vascular disease. In order to heal from your surgery, it is CRITICAL to get adequate nutrition. Your body requires vitamins, minerals, and protein. Vegetables are the best source of vitamins and minerals. Vegetables also provide the perfect balance of protein. Processed food has little nutritional  value, so try to avoid this.  Medications  Resume taking all of your medications unless your doctor or physician assistant tells you not to.  If your incision is causing pain, you may take over-the- counter pain relievers such as acetaminophen (Tylenol). If you were prescribed a stronger pain medication, please be aware these medications can cause nausea and constipation.  Prevent nausea by taking the medication with a snack or meal. Avoid constipation by drinking plenty of fluids and eating foods with a high amount of fiber, such as fruits, vegetables, and grains. Do not take Tylenol if you are taking prescription pain medications.  Follow Up  Our office will schedule a follow up appointment 2-3 weeks following discharge.  Please call us immediately for any of the following conditions  Increased pain, redness, drainage (pus) from your incision site. Fever of 101 degrees or higher. If you should develop stroke (slurred speech, difficulty swallowing, weakness on one  side of your body, loss of vision) you should call 911 and go to the nearest emergency room.  Reduce your risk of vascular disease:  Stop smoking. If you would like help call [facility] at 1-800-QUIT-NOW (206) 400-0520) or [place] at [phone]. Manage your cholesterol Maintain a desired weight Control your diabetes Keep your blood pressure down  If you have any questions, please call the office at [phone].  Prescriptions given: Percocet #20 No Refill  Disposition: Home  Patient's condition: is Excellent  Follow up: 1. Dr. Trula Slade in 2 weeks.   Barbie Banner PA-C [facility] [phone]   --- For [facility] Registry use ---   Modified Rankin score at D/C (0-6): 0  IV medication needed for:  1. Hypertension: Yes 2. Hypotension: No  Post-op Complications: No  1. Post-op CVA or TIA: No  If yes: Event classification (right eye, left eye, right cortical, left cortical,  verterobasilar, other):   If yes: Timing of event (intra-op, <6 hrs post-op, >=6 hrs post-op, unknown):   2. CN injury: No  If yes: CN n/a injuried   3. Myocardial infarction: No  If yes: Dx by (EKG or clinical, Troponin):   4.  CHF: No  5.  Dysrhythmia (new): No  6. Wound infection: No  7. Reperfusion symptoms: No  8. Return to OR: No  If yes: return to OR for (bleeding, neurologic, other CEA incision, other):   Discharge medications: Statin use:  Yes ASA use:  No   Beta blocker use:  No ACE-Inhibitor use:  No  ARB use:  No CCB use: No P2Y12 Antagonist use: No, [ ]  Plavix, [ ]  Plasugrel, [ ]  Ticlopinine, [ ]  Ticagrelor, [ ]  Other, [ ]  No for medical reason, [ ]  Non-compliant, [ ]  Not-indicated Anti-coagulant use:  No, [ ]  Warfarin, [ ]  Rivaroxaban, [ ]  Dabigatran,

## 2021-03-08 NOTE — Progress Notes (Addendum)
Progress Note    03/08/2021 7:50 AM 1 Day Post-Op  Subjective:  Left frontal headache post-op yesterday resolved with Tylenol and Percocet. Also had a dose of morphine which upset his stomach. This has also resolved. No difficulty swallowing. Wife at bedside.  He is s/p left carotid body tumor excision several weeks ago and had minor hypoglossal nerve injury.  Vitals:   03/08/21 0400 03/08/21 0549  BP: (!) 151/99 (!) 147/98  Pulse: 87 80  Resp: 14 14  Temp:  98.5 F (36.9 C)  SpO2: 99% 97%    Drain output 18cc  Physical Exam: General appearance: Awake, alert in no apparent distress Neurologic: Alert and oriented x4, tongue with slight deviation to the left, face symmetric, grip strength 5/5 bilaterally Cardiac: Heart rate and rhythm are regular Respirations: Nonlabored Incision: Well approximated without bleeding. Small incisional hematoma, soft and non-expanding. Scant, thin bloody drain output.   CBC    Component Value Date/Time   WBC 14.8 (H) 03/08/2021 0543   RBC 4.67 03/08/2021 0543   HGB 12.1 (L) 03/08/2021 0543   HCT 37.8 (L) 03/08/2021 0543   PLT 278 03/08/2021 0543   MCV 80.9 03/08/2021 0543   MCH 25.9 (L) 03/08/2021 0543   MCHC 32.0 03/08/2021 0543   RDW 17.0 (H) 03/08/2021 0543   LYMPHSABS 3.1 03/07/2021 0514   MONOABS 0.8 03/07/2021 0514   EOSABS 0.1 03/07/2021 0514   BASOSABS 0.1 03/07/2021 0514    BMET    Component Value Date/Time   NA 137 03/08/2021 0543   K 4.0 03/08/2021 0543   CL 104 03/08/2021 0543   CO2 23 03/08/2021 0543   GLUCOSE 106 (H) 03/08/2021 0543   BUN 8 03/08/2021 0543   CREATININE 1.17 03/08/2021 0543   CALCIUM 9.4 03/08/2021 0543   GFRNONAA >60 03/08/2021 0543     Intake/Output Summary (Last 24 hours) at 03/08/2021 0750 Last data filed at 03/08/2021 0400 Gross per 24 hour  Intake 1797.51 ml  Output 4143 ml  Net -2345.49 ml    HOSPITAL MEDICATIONS Scheduled Meds:  Chlorhexidine Gluconate Cloth  6 each Topical  Daily   docusate sodium  100 mg Oral Daily   heparin  5,000 Units Subcutaneous Q8H   pantoprazole  40 mg Oral Daily   Continuous Infusions:  sodium chloride 75 mL/hr at 03/07/21 0500   sodium chloride     sodium chloride Stopped (03/08/21 0049)   magnesium sulfate bolus IVPB     PRN Meds:.sodium chloride, acetaminophen **OR** acetaminophen, alum & mag hydroxide-simeth, bisacodyl, guaiFENesin-dextromethorphan, labetalol, magnesium sulfate bolus IVPB, metoprolol tartrate, morphine injection, ondansetron, oxyCODONE-acetaminophen, phenol, polyethylene glycol, potassium chloride  Assessment and Plan: POD 1 Resection of right carotid body tumor, primary repair of internal carotid artery injury. Sys BP range 150-160s overnight. 147/98 at 6 am.Received labetolol at 4pm yesterday. Neuro at baseline.   Likely drain out and discharge home today. Follow-up with Dr. Trula Slade in 2 weeks. Will put in follow-up for Dr. Constance Holster in 2-3 weeks.  -DVT prophylaxis:  heparin [place]   Risa Grill, PA-C [facility] [phone] 03/08/2021  7:50 AM   I have seen and evaluated the patient. I agree with the PA note as documented above.  Postop day 1 status post resection of right carotid body tumor.  Appears grossly neurologically intact except for some mild left tongue deviation which is from his previous left carotid body tumor surgery several weeks ago.  Some mild fullness in the neck but no significant hematoma.  We  will remove drain and plan for discharge home.  He has been seen by ENT as well who is in agreement.  Marty Heck, MD [facility] [place] Office: [phone]

## 2021-03-10 ENCOUNTER — Encounter (HOSPITAL_COMMUNITY): Payer: BC Managed Care – PPO

## 2021-03-10 ENCOUNTER — Encounter: Payer: BC Managed Care – PPO | Admitting: Surgery

## 2021-03-10 ENCOUNTER — Telehealth: Payer: Self-pay

## 2021-03-10 NOTE — Telephone Encounter (Signed)
Patient calls today c/o headaches s/p carotid body tumor excision on 11/4. Says it is a throbbing on top of his head that decreases when he takes medicine. Discussed with Dr. Trula Slade - this is a fairly normal occurrence, instructed patient to monitor his BP and keep below 140s - continue to take pain medicine/can alternate with Tylenol. Patient verbalizes understanding.

## 2021-03-11 ENCOUNTER — Other Ambulatory Visit (HOSPITAL_COMMUNITY): Payer: Self-pay | Admitting: Interventional Radiology

## 2021-03-11 ENCOUNTER — Encounter (HOSPITAL_COMMUNITY): Payer: Self-pay

## 2021-03-11 DIAGNOSIS — D446 Neoplasm of uncertain behavior of carotid body: Secondary | ICD-10-CM

## 2021-03-11 HISTORY — PX: IR ANGIO VERTEBRAL SEL SUBCLAVIAN INNOMINATE UNI R MOD SED: IMG5365

## 2021-03-13 LAB — SURGICAL PATHOLOGY

## 2021-03-21 ENCOUNTER — Telehealth: Payer: Self-pay | Admitting: Genetic Counselor

## 2021-03-21 NOTE — Telephone Encounter (Signed)
Called to check in about SDHD family follow-up testing for his children.  Provided information for his children's PCP to refer to Dr. Retta Mac at [facility].  Contact information provided for questions as needed.

## 2021-03-31 ENCOUNTER — Encounter: Payer: Self-pay | Admitting: Physician Assistant

## 2021-03-31 ENCOUNTER — Encounter: Payer: BC Managed Care – PPO | Admitting: Surgery

## 2021-03-31 ENCOUNTER — Other Ambulatory Visit: Payer: Self-pay

## 2021-03-31 ENCOUNTER — Ambulatory Visit (INDEPENDENT_AMBULATORY_CARE_PROVIDER_SITE_OTHER): Payer: BC Managed Care – PPO | Admitting: Surgery

## 2021-03-31 VITALS — BP 137/90 | HR 97 | Temp 98.6°F | Resp 20 | Ht 72.0 in | Wt 169.2 lb

## 2021-03-31 DIAGNOSIS — D446 Neoplasm of uncertain behavior of carotid body: Secondary | ICD-10-CM

## 2021-03-31 NOTE — Progress Notes (Signed)
   Patient name: Kenneth CEFALU Sr. MRN: [id number] DOB: [birth year] Sex: male  REASON FOR VISIT:     Post op  HISTORY OF PRESENT ILLNESS:   Kenneth L Grajeda Sr. is a 40 y.o. 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  This was done in conjunction with Dr. Constance Holster.  Pathology was consistent with paraganglioma.  He is recovering nicely.  He does not have any swallowing difficulties.   CURRENT MEDICATIONS:    Current Outpatient Medications  Medication Sig Dispense Refill   oxyCODONE-acetaminophen (PERCOCET/ROXICET) 5-325 MG tablet Take 1-2 tablets by mouth every 4 (four) hours as needed for moderate pain. For acute post-operative pain 20 tablet 0   rosuvastatin (CRESTOR) 40 MG tablet Take 1 tablet (40 mg total) by mouth daily. 30 tablet 2   acetaminophen (TYLENOL) 500 MG tablet Take 2 tablets (1,000 mg total) by mouth every 8 (eight) hours as needed for mild pain. (Patient not taking: Reported on 12/27/2020)  0   docusate sodium (COLACE) 100 MG capsule Take 1 capsule (100 mg total) by mouth 2 (two) times daily. (Patient not taking: Reported on 02/10/2021) 60 capsule 0   ibuprofen (ADVIL) 800 MG tablet Take 800 mg by mouth every 8 (eight) hours as needed for headache or moderate pain. (Patient not taking: Reported on 03/31/2021)     methocarbamol (ROBAXIN) 500 MG tablet Take 1 tablet (500 mg total) by mouth every 6 (six) hours as needed for muscle spasms. (Patient not taking: Reported on 02/10/2021) 20 tablet 0   omeprazole (PRILOSEC) 40 MG capsule Take 1 capsule (40 mg total) by mouth daily. (Patient not taking: Reported on 12/03/2020) 30 capsule 1   polyethylene glycol (MIRALAX / GLYCOLAX) 17 g packet Take 17 g by mouth daily as needed (constipation). (Patient not taking: Reported on 03/04/2021) 14 each 0   No current facility-administered medications for this visit.    REVIEW OF SYSTEMS:   [X]  denotes positive finding, [ ]  denotes  negative finding Cardiac  Comments:  Chest pain or chest pressure:    Shortness of breath upon exertion:    Short of breath when lying flat:    Irregular heart rhythm:    Constitutional    Fever or chills:      PHYSICAL EXAM:   Vitals:   03/31/21 0833 03/31/21 0834  BP: 124/84 137/90  Pulse: 97   Resp: 20   Temp: 98.6 F (37 C)   TempSrc: Temporal   SpO2: 98%   Weight: 169 lb 3.2 oz (76.7 kg)   Height: 6' (1.829 m)     GENERAL: The patient is a well-nourished male, in no acute distress. The vital signs are documented above. CARDIOVASCULAR: There is a regular rate and rhythm. PULMONARY: Non-labored respirations Incisions are well-healed. Smile is symmetric Tongue is midline  STUDIES:   None   MEDICAL ISSUES:   Stable from bilateral carotid body tumor excision.  He will follow-up as needed.  Kenneth Alf, MD, FACS [facility] [phone] Pager [phone]

## 2021-05-16 ENCOUNTER — Other Ambulatory Visit: Payer: Self-pay | Admitting: Physician Assistant

## 2021-06-27 ENCOUNTER — Other Ambulatory Visit: Payer: Self-pay

## 2021-06-27 ENCOUNTER — Ambulatory Visit: Payer: BC Managed Care – PPO | Admitting: Internal Medicine

## 2021-06-27 ENCOUNTER — Encounter: Payer: Self-pay | Admitting: Internal Medicine

## 2021-06-27 VITALS — BP 118/82 | HR 72 | Ht 72.0 in | Wt 167.8 lb

## 2021-06-27 DIAGNOSIS — I10 Essential (primary) hypertension: Secondary | ICD-10-CM | POA: Diagnosis not present

## 2021-06-27 DIAGNOSIS — R1900 Intra-abdominal and pelvic swelling, mass and lump, unspecified site: Secondary | ICD-10-CM

## 2021-06-27 DIAGNOSIS — D447 Neoplasm of uncertain behavior of aortic body and other paraganglia: Secondary | ICD-10-CM

## 2021-06-27 DIAGNOSIS — D446 Neoplasm of uncertain behavior of carotid body: Secondary | ICD-10-CM

## 2021-06-27 NOTE — Patient Instructions (Signed)
Please stop at the lab.  Please come back ~02/2022.

## 2021-06-27 NOTE — Progress Notes (Addendum)
Patient ID: Kenneth Pastor., male   DOB: 12/16/1980, 41 y.o.   MRN: [id number]   This visit occurred during the SARS-CoV-2 public health emergency.  Safety protocols were in place, including screening questions prior to the visit, additional usage of staff PPE, and extensive cleaning of exam room while observing appropriate contact time as indicated for disinfecting solutions.   HPI  Kenneth L Macomber Sr. is a 41 y.o.-year-old male, initially referred by Dr. 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  Last visit 6 months ago.  Interim hx: He continues to have back pain but less sciatica compared to last visit.  No headache, palpitations, significant weight changes. At last visit I saw him soon after his abdominal paraganglioma resection (12/2020).  Since last visit, he is s/p left and right carotid body tumor resection by Dr. Trula Slade (02/21/2021 and 03/07/2021, respectively)  -with previous evaluation by ENT (Dr. Constance Holster). He feels well after the surgery, without complaints.  He is working with the geneticist Loralyn Freshwater -to also have his children tested (by Dr. Retta Mac at [facility]).  He has 3 boys (one of them was tested and the results are negative, the other 2 were not tested yet), and 1 girl (living in [place], results are pending).  Reviewed and addended history: Patient presented to the ED on 08/25/2020 for abdominal pain.  At that time, he was found to have mild appendicitis and treated with antibiotics.  However, during work-up for this, he had a CT scan showing: 2.9 cm enhancing left retroperitoneal mass, in proximity to the distal duodenum. Primary considerations include neurogenic tumor, extra-adrenal pheochromocytoma, less likely gastrointestinal stromal tumor or neuroendocrine tumor:   Upon questioning, patient has a family history of: - pericarotid paraganglioma (unresectable) and  pheochromocytoma (hormonally active) in a brother (40 years old) - he was in a study at [facility] and had genetic testing >> father is carrier - neck paraganglioma (silent) in another brother 40 years old) - HTN in father (23 years old) - Pancreatic cancer in maternal grandmother (diagnosed in her early 11s)  Due to his family history, he was referred for genetic counseling.  He had this appointment on 09/09/2020. Blood was sent for a mutation panel containing 84 genes including: MEN1, RET, VHL, SDHx, MAX, TMEM127, PRKAR1A, Tp53, MLH and Bieber.  The results were positive for SDHD mutation.  I referred him for a PET scan to screen for other catecholamine producing tumors (10/16/2020):  Mediastinal blood pool activity: SUV max 2.1   HEAD/NECK: Bilateral hypermetabolic lesions are identified in the deep neck bilaterally, in the region of the carotid bifurcation although vascular anatomy not well demonstrated on noncontrast CT imaging. Lesion on the left measures approximately 1.9 x 1.8 cm, but again, is not well demonstrated given lack of intravenous contrast. This lesion demonstrates SUV max = 20.8. The lesion on the right measures approximately 2.0 x 1.3 cm with SUV max = 13.2.   Incidental CT findings: none   CHEST: No hypermetabolic mediastinal or hilar nodes. No suspicious pulmonary nodules on the CT scan.   Incidental CT findings: none   ABDOMEN/PELVIS: No abnormal hypermetabolic activity within the liver, pancreas, adrenal glands, or spleen. No hypermetabolic lymph nodes in the abdomen or pelvis.The 2.9 cm left retroperitoneal lesion described on previous CT scan is markedly hypermetabolic with SUV max = 80.9.   Incidental CT findings: none   SKELETON: No focal hypermetabolic activity to suggest skeletal  metastasis.   Incidental CT findings: Air-fluid levels noted in the maxillary sinuses bilaterally with opacification of scattered ethmoid air cells. Status post pin placement right  femoral neck.   EXTREMITIES: No abnormal hypermetabolic activity in the lower extremities.   Incidental CT findings: none   IMPRESSION: 1. Bilateral hypermetabolic soft tissue lesions in the neck, in the region of each carotid bifurcation. Given the reported family history, bilateral glomus tumor would be a distinct consideration. Neck CT with contrast recommended to further evaluate. 2. Known left retroperitoneal lesion is markedly hypermetabolic. 3. No evidence for hypermetabolic disease in the chest or pelvis. No unexpected hypermetabolism in the bony anatomy or lower extremities.  CT scan (10/28/2020): Vascular: Prominently enhancing mass lesions are seen within the carotid sheath bilaterally at the level of the carotid bifurcation with splaying of the ECA-ICA on each side, measuring approximately 2.7 x 1.6 x 1.6 cm on the right and 3 x 2.2 x 1.9 cm on the left. Findings are more consistent with bilateral carotid body tumors.   Limited intracranial: Negative.   Visualized orbits: Negative.   Mastoids and visualized paranasal sinuses: Fluid level within the bilateral maxillary sinuses and mucosal thickening in the bilateral ethmoid cells. Correlate clinically for acute sinusitis.   Skeleton: No acute or aggressive process.   Upper chest: Paraseptal emphysema on the left.   Other: None.   IMPRESSION: 1. Bilateral carotid bifurcation tumors measuring up to 2.7 cm on the right and 3 cm on the left, most consistent with carotid body tumors. 2. Inflammatory paranasal sinus disease. Correlate clinically for acute sinusitis.  Surgery for retroperitoneal mass resection (12/16/2020): A. SOFT TISSUE MASS, RETROPERITONEAL, EXCISION:  - Extra-adrenal paraganglioma, see comment  The lesional cells are positive for synaptophysin, chromogranin and  vimentin with only scattered cells staining for S100.  Cells are  negative for AE1/AE3, Melan-A, HMB45, desmin, CD31, SMA, D2-40, AFP,   glypican-3 and GATA3.  Immunostain for Ki-67 shows a low proliferative  index.  This immunoprofile is consistent with above interpretation.   Surgery for left carotid body tumor resection (02/21/2021) -Dr. Trula Slade: A. SOFT TISSUE TUMOR, LEFT CAROTID BODY, EXCISION:  - Carotid body paraganglioma, 2.7 cm. - Tumor focally involves margin of specimen.  - See comment.   COMMENT:  The tumor is characterized by epithelioid cells in clusters surrounded  by fibrovascular stroma.  Immunohistochemistry is positive for CD56,  synaptophysin, chromogranin and S100 while GATA3 and cytokeratin AE1/AE3  are negative.  Ki-67 shows a low proliferation rate.  The morphology and  immunophenotype are consistent with extra-adrenal paraganglioma (carotid  body paraganglioma).   Surgery for right carotid body tumor resection (03/07/2021) -Dr. Trula Slade:  A. CAROTID, RIGHT WITH CERVICAL LYMPH NODE, EXCISION:  - Paraganglioma, 1.7 cm, involving the right carotid  - No evidence of necrosis or increased mitotic activity  - Inked surface is positive for tumor  - Benign lymph node   Immunohistochemical stain for Ki-67 shows a low proliferative index of  about 2%.  I reviewed his catecholamine and metanephrine levels: Component     Latest Ref Rng & Units 09/24/2020 (Preop) 12/27/2020 (11 days postop)  Epinephrine     pg/mL 46 108 (H)  Norepinephrine     pg/mL 1,810 (H) 488  Dopamine     pg/mL 69 (H) 39 (H)  Total Catecholamines     pg/mL 1,925 (H) 635  Metanephrine, Pl     <=57 pg/mL 40 74 (H)  Normetanephrine, Pl     <=148 pg/mL 634 (  H) 148  Total Metanephrines-Plasma     <=205 pg/mL 674 (H) 222 (H)  Epinephrine and metanephrine were slightly higher at last visit, possibly in relationship to recent surgery.   Patient's medical history includes: -Lumbar back pain with radiculopathy -Right hip fracture in 2007 s/p ORIF -Sinusitis -Bilateral varicocele and hydrocele  He does not have: -Hypertensive  spells -Palpitations -Increased sweating -Headaches -Increased anxiety  Reviewed previous blood pressure levels: BP Readings from Last 3 Encounters:  03/31/21 137/90  03/08/21 135/80  03/03/21 (!) 137/94   He is not on antihypertensive medications.    Chemistry      Component Value Date/Time   NA 137 03/08/2021 0543   K 4.0 03/08/2021 0543   CL 104 03/08/2021 0543   CO2 23 03/08/2021 0543   BUN 8 03/08/2021 0543   CREATININE 1.17 03/08/2021 0543      Component Value Date/Time   CALCIUM 9.4 03/08/2021 0543   ALKPHOS 67 03/06/2021 1502   AST 30 03/06/2021 1502   ALT 45 (H) 03/06/2021 1502   BILITOT 0.6 03/06/2021 1502     No history of hypokalemia: Lab Results  Component Value Date   K 4.0 03/08/2021   K 3.8 03/07/2021   K 3.9 03/06/2021   K 4.1 03/06/2021   K 3.7 02/22/2021   K 3.7 02/21/2021   K 3.7 02/20/2021   K 4.0 02/19/2021   K 3.7 02/14/2021   K 3.8 12/17/2020   K 3.5 12/16/2020   K 3.6 12/14/2020   K 4.1 12/03/2020   K 3.8 11/27/2020   K 4.6 08/26/2020   K 4.2 08/25/2020   No known history of hypo or hyperthyroidism.  TSH levels were normal: No results found for: TSH   No h/o diabetes: 2 12/21/2008: HbA1c 5.8% No results found for: HGBA1C   No known hyperparathyroidism or hypercalcemia: No results found for: PTH  Lab Results  Component Value Date   CALCIUM 9.4 03/08/2021   CALCIUM 9.0 03/07/2021   CALCIUM 9.3 03/06/2021   CALCIUM 9.5 03/06/2021   CALCIUM 9.3 02/22/2021   CALCIUM 9.5 02/21/2021   CALCIUM 9.2 02/20/2021   CALCIUM 9.1 02/19/2021   CALCIUM 9.3 02/14/2021   CALCIUM 9.4 12/17/2020   CALCIUM 8.6 (L) 12/16/2020   CALCIUM 9.4 12/14/2020   CALCIUM 9.9 12/03/2020   CALCIUM 9.7 11/27/2020   CALCIUM 9.4 08/26/2020   CALCIUM 9.6 08/25/2020   Pt. denies use of stimulants.   Denies street drug use.   Not on steroids now.  He had a spinal block earlier in the year. He stopped smoking -gained some weight afterwards. Previously  on gabapentin for back pain due to herniated disc, but stopped.  ROS: + see HPI   Past Medical History:  Diagnosis Date   Family history of pancreatic cancer 09/09/2020   Family history of pheochromocytoma 09/09/2020   Lumbar herniated disc    Monoallelic mutation of SDHD gene 10/01/2020   Past Surgical History:  Procedure Laterality Date   CAROTID BODY TUMOR EXCISION Left 02/21/2021   Procedure: TUMOR EXCISION LEFT CAROTID BODY;  Surgeon: Izora Gala, MD;  Location: [place];  Service: ENT;  Laterality: Left;   HIP SURGERY     IR ANGIO EXTERNAL CAROTID SEL EXT CAROTID UNI R MOD SED  03/06/2021   IR ANGIO INTRA EXTRACRAN SEL COM CAROTID INNOMINATE BILAT MOD SED  02/19/2021   IR ANGIO INTRA EXTRACRAN SEL COM CAROTID INNOMINATE UNI R MOD SED  03/06/2021   IR ANGIO VERTEBRAL  SEL SUBCLAVIAN INNOMINATE UNI R MOD SED  03/11/2021   IR ANGIOGRAM FOLLOW UP STUDY  02/19/2021   IR ANGIOGRAM FOLLOW UP STUDY  02/19/2021   IR ANGIOGRAM FOLLOW UP STUDY  02/19/2021   IR ANGIOGRAM FOLLOW UP STUDY  03/06/2021   IR TRANSCATH/EMBOLIZ  02/19/2021   IR TRANSCATH/EMBOLIZ  03/06/2021   MASS BIOPSY Right 03/07/2021   Procedure: RIGHT CAROTID BODY TUMOR EXCISION;  Surgeon: Izora Gala, MD;  Location: [place];  Service: ENT;  Laterality: Right;   RADIOLOGY WITH ANESTHESIA N/A 02/19/2021   Procedure: EMBOLIZATION;  Surgeon: Luanne Bras, MD;  Location: [place];  Service: Radiology;  Laterality: N/A;   RADIOLOGY WITH ANESTHESIA N/A 03/06/2021   Procedure: Right carotid body tumor embolization;  Surgeon: Luanne Bras, MD;  Location: [place];  Service: Radiology;  Laterality: N/A;   RESECTION OF RETROPERITONEAL MASS N/A 12/16/2020   Procedure: EXCISION OF RETROPERITONEAL MASS PHEOCHROMOCYTOMA;  Surgeon: Dwan Bolt, MD;  Location: [place];  Service: General;  Laterality: N/A;   Social History   Socioeconomic History   Marital status: Married    Spouse name: Not on file   Number of children: 4   Years of  education: Not on file   Highest education level: Not on file  Occupational History   Occupation: Delivery driver for [employer]  Tobacco Use   Smoking status: Former    Types: Cigarettes    Quit date: 08/2020    Years since quitting: 0.9   Smokeless tobacco: Never  Vaping Use   Vaping Use: Former  Substance and Sexual Activity   Alcohol use: Yes    Comment: occassionally- "once every 2 weeks"   Drug use: No   Sexual activity: Yes  Other Topics Concern   Not on file  Social History Narrative   Not on file   Social Determinants of Health   Financial Resource Strain: Not on file  Food Insecurity: Not on file  Transportation Needs: Not on file  Physical Activity: Not on file  Stress: Not on file  Social Connections: Not on file  Intimate Partner Violence: Not on file   Current Outpatient Medications on File Prior to Visit  Medication Sig Dispense Refill   acetaminophen (TYLENOL) 500 MG tablet Take 2 tablets (1,000 mg total) by mouth every 8 (eight) hours as needed for mild pain. (Patient not taking: Reported on 12/27/2020)  0   docusate sodium (COLACE) 100 MG capsule Take 1 capsule (100 mg total) by mouth 2 (two) times daily. (Patient not taking: Reported on 02/10/2021) 60 capsule 0   ibuprofen (ADVIL) 800 MG tablet Take 800 mg by mouth every 8 (eight) hours as needed for headache or moderate pain. (Patient not taking: Reported on 03/31/2021)     methocarbamol (ROBAXIN) 500 MG tablet Take 1 tablet (500 mg total) by mouth every 6 (six) hours as needed for muscle spasms. (Patient not taking: Reported on 02/10/2021) 20 tablet 0   omeprazole (PRILOSEC) 40 MG capsule Take 1 capsule (40 mg total) by mouth daily. (Patient not taking: Reported on 12/03/2020) 30 capsule 1   oxyCODONE-acetaminophen (PERCOCET/ROXICET) 5-325 MG tablet Take 1-2 tablets by mouth every 4 (four) hours as needed for moderate pain. For acute post-operative pain 20 tablet 0   polyethylene glycol (MIRALAX / GLYCOLAX) 17  g packet Take 17 g by mouth daily as needed (constipation). (Patient not taking: Reported on 03/04/2021) 14 each 0   rosuvastatin (CRESTOR) 40 MG tablet Take 1 tablet (40 mg total) by  mouth daily. 30 tablet 2   No current facility-administered medications on file prior to visit.   No Known Allergies Family History  Problem Relation Age of Onset   Hypertension Father    Pancreatic cancer Maternal Grandmother        dx early 23s   Prostate cancer Paternal Grandfather        d. 17s   Other Half-Brother        paternal; paraganglioma and pheochromocytoma; SDHD mutation    Other Half-Brother        paternal; ? paraganglioma   PE: BP 118/82 (BP Location: Left Arm, Patient Position: Sitting, Cuff Size: Normal)    Pulse 72    Ht 6' (1.829 m)    Wt 167 lb 12.8 oz (76.1 kg)    SpO2 98%    BMI 22.76 kg/m  Wt Readings from Last 3 Encounters:  06/27/21 167 lb 12.8 oz (76.1 kg)  03/31/21 169 lb 3.2 oz (76.7 kg)  03/06/21 166 lb (75.3 kg)   Constitutional: Normal weight, in NAD Eyes: PERRLA, EOMI ENT: moist mucous membranes, no thyromegaly,  no cervical lymphadenopathy Cardiovascular: RRR, No MRG Respiratory: CTA B Musculoskeletal: no deformities, strength intact in all 4 Skin: moist, warm, no rashes Neurological: no tremor with outstretched hands, DTR normal in all 4  ASSESSMENT: 1.  Retroperitoneal paraganglioma  2.  Carotid body tumor (paraganglioma  - Family history of pheochromocytoma and paraganglioma  - Positive for SDHD mutation  3.  Elevated blood pressure  PLAN: 1. Retroperitoneal paraganglioma -In 08/2020, patient was incidentally found to have a left retroperitoneal mass measuring 2.9 cm during investigation for appendicitis.  At that time, upon questioning, he had a family history of paraganglioma in 2 of his brothers and pheochromocytoma in one of his brothers.  He also had hypertension in his father.  Patient himself had stage 0-1 hypertension.  He was referred to a  Dietitian.  He had a panel of gene mutations performed (for Li-Fraumeni syndrome, Lynch syndrome, Carney complex, MEN 1, MEN 2, VHL, SDH mutations, etc.).  Results were positive for Nord mutation.  He was referred to endocrinology and surgery. -When I first saw him, his norepinephrine and normetanephrine levels were very elevated, confirming the suspicion for catecholamine producing tumor.  A PET scan was performed and this returned showing increased activity in the left retroperitoneal mass and also bilateral carotids, pointing towards bilateral neck paragangliomas.  -He had resection of the retroperitoneal mass on 12/16/2020 by Dr. Wilburn Cornelia and pathology showed a low Ki-67 index.  He did well after the surgery.  His metanephrines and catecholamines decreased but they were still elevated at last visit. -At today's visit, he feels well, without complaints -We will repeat his catecholamines and metanephrines today  2.  Carotid body tumor (neck paraganglioma) -These are bilateral, discovered on PET scan in the setting of investigation for SDHD mutation and also in the setting of retroperitoneal paraganglioma.  Neck MRI confirmed the presence of the masses.  He was referred to vascular surgery.  He saw Dr. Trula Slade and Dr. Constance Holster.  Since last visit, he had resection of bilateral paragangliomas, with the left tumor resected on 02/21/2021 and the right tumor resected on 03/07/2021, after respective embolization. -He feels well after the surgery -At this visit, we will check his catecholamines and metanephrines.  I advised him that if these are elevated, we may need to check them in urine for better specificity. -We discussed about the capacity of metastasis for such  tumor and about  further follow-up with annual catecholamines and metanephrines but, since he has a hereditary mutation, he will also need imaging.  Guidelines are vague about type and frequency of imaging and the recommendation is for a  personalized approach. -My suggestion would be to check an MRI of the neck and abdomen at next visit.  PET scan could also be performed in the future, but, since he is so young, we need to avoid radiation exposure. -I will see him back in 8 months  3. HTN -Patient's blood pressure is excellent today, previously was slightly high -He was on alpha-blockade before the surgeries -I recommended that he checked his blood pressure at home -if starting to increase, he may need to be on daily medication. He does have a blood pressure cuff and his wife is a paramedic.  - Total time spent for the visit: 40 min, in obtaining medical information from the chart, reviewing his  previous labs, imaging evaluations, and recent surgeries, reviewing his symptoms, counseling him about his condition (please see the discussed topics above), and developing a plan to further investigate and treat it; he had a number of questions which I addressed.  Addendum: Component     Latest Ref Rng & Units 06/27/2021  Metanephrine, Pl     <=57 pg/mL 38  Normetanephrine, Pl     <=148 pg/mL 101  Total Metanephrines-Plasma     <=205 pg/mL 139   Component     Latest Ref Rng & Units 06/27/2021  Epinephrine     pg/mL 50  Norepinephrine     pg/mL 388  Dopamine     pg/mL <10  Total Catecholamines     pg/mL 438  Metanephrine, Pl     <=57 pg/mL 38  Normetanephrine, Pl     <=148 pg/mL 101  Total Metanephrines-Plasma     <=205 pg/mL 139   All normal!  Philemon Kingdom, MD PhD [facility] Endocrinology

## 2021-07-10 ENCOUNTER — Other Ambulatory Visit: Payer: Self-pay

## 2021-07-14 LAB — CATECHOLAMINES, FRACTIONATED, PLASMA
Dopamine: <10 pg/mL
Epinephrine: 50 pg/mL
Norepinephrine: 388 pg/mL
Total Catecholamines: 438 pg/mL

## 2021-07-14 LAB — METANEPHRINES, PLASMA
Metanephrine, Free: 38 pg/mL (ref <=57)
Normetanephrine, Free: 101 pg/mL (ref <=148)
Total Metanephrines-Plasma: 139 pg/mL (ref <=205)

## 2021-07-29 ENCOUNTER — Encounter: Payer: Self-pay | Admitting: Surgery

## 2022-02-27 ENCOUNTER — Ambulatory Visit: Payer: BC Managed Care – PPO | Admitting: Internal Medicine

## 2022-02-27 ENCOUNTER — Encounter: Payer: Self-pay | Admitting: Internal Medicine

## 2022-02-27 VITALS — BP 106/78 | HR 82 | Ht 72.0 in | Wt 163.8 lb

## 2022-02-27 DIAGNOSIS — D447 Neoplasm of uncertain behavior of aortic body and other paraganglia: Secondary | ICD-10-CM | POA: Diagnosis not present

## 2022-02-27 DIAGNOSIS — I10 Essential (primary) hypertension: Secondary | ICD-10-CM

## 2022-02-27 DIAGNOSIS — D446 Neoplasm of uncertain behavior of carotid body: Secondary | ICD-10-CM | POA: Diagnosis not present

## 2022-02-27 NOTE — Patient Instructions (Addendum)
Please stop at the lab.  Schedule a follow up appt. with Dr. Zenia Resides. We will need imaging afterwards, but let's get her input, too.  Please return in 1 year.

## 2022-02-27 NOTE — Progress Notes (Signed)
Patient ID: Kenneth Blackwell., male   DOB: 03-05-1981, 41 y.o.   MRN: [id number]   HPI  Kenneth L Degregory Sr. is a 41 y.o.-year-old male, initially referred by Dr. 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  Last visit 8 months ago.  Pertinent history: Patient had abdominal paraganglioma resection (12/2020) by Dr. Michaelle Birks.  Afterwards, he had left and right carotid body tumor resection by Dr. Trula Slade (02/21/2021 and 03/07/2021, respectively)  -with previous evaluation by ENT (Dr. Constance Holster). He worked with the geneticist Loralyn Freshwater -to also have his children tested (by Dr. Retta Mac at [facility]).  He has 3 boys (one of them - boy -was tested and the results are negative, the other 2 -girls- need to wait until 40 years old), and 1 girl (living in [place]) - not tested yet.  Interim hx: He continues to have back pain, but less sciatica symptoms. He submitted information for disability. No headache, palpitations, significant weight changes. He has a little numbness on the jaw bilaterally after the surgery >> improving.  Also, occasional gas bubble in throat.  Reviewed history: Patient presented to the ED on 08/25/2020 for abdominal pain.  At that time, he was found to have mild appendicitis and treated with antibiotics.  However, during work-up for this, he had a CT scan showing: 2.9 cm enhancing left retroperitoneal mass, in proximity to the distal duodenum. Primary considerations include neurogenic tumor, extra-adrenal pheochromocytoma, less likely gastrointestinal stromal tumor or neuroendocrine tumor:   Upon questioning, patient has a family history of: - pericarotid paraganglioma (unresectable) and pheochromocytoma (hormonally active) in a brother (34 years old) - he was in a study at [facility] and had genetic testing >> father is carrier - neck paraganglioma (silent) in another brother 40 years old) -  HTN in father (17 years old) - Pancreatic cancer in maternal grandmother (diagnosed in her early 63s)  Due to his family history, he was referred for genetic counseling.  He had this appointment on 09/09/2020. Blood was sent for a mutation panel containing 84 genes including: MEN1, RET, VHL, SDHx, MAX, TMEM127, PRKAR1A, Tp53, MLH and [place].  The results were positive for SDHD mutation.  I referred him for a PET scan to screen for other catecholamine producing tumors (10/16/2020):  Mediastinal blood pool activity: SUV max 2.1   HEAD/NECK: Bilateral hypermetabolic lesions are identified in the deep neck bilaterally, in the region of the carotid bifurcation although vascular anatomy not well demonstrated on noncontrast CT imaging. Lesion on the left measures approximately 1.9 x 1.8 cm, but again, is not well demonstrated given lack of intravenous contrast. This lesion demonstrates SUV max = 20.8. The lesion on the right measures approximately 2.0 x 1.3 cm with SUV max = 13.2.   Incidental CT findings: none   CHEST: No hypermetabolic mediastinal or hilar nodes. No suspicious pulmonary nodules on the CT scan.   Incidental CT findings: none   ABDOMEN/PELVIS: No abnormal hypermetabolic activity within the liver, pancreas, adrenal glands, or spleen. No hypermetabolic lymph nodes in the abdomen or pelvis.The 2.9 cm left retroperitoneal lesion described on previous CT scan is markedly hypermetabolic with SUV max = 62.9.   Incidental CT findings: none   SKELETON: No focal hypermetabolic activity to suggest skeletal metastasis.   Incidental CT findings: Air-fluid levels noted in the maxillary sinuses bilaterally with opacification of scattered ethmoid air cells. Status post pin placement right femoral neck.   EXTREMITIES:  No abnormal hypermetabolic activity in the lower extremities.   Incidental CT findings: none   IMPRESSION: 1. Bilateral hypermetabolic soft tissue lesions in the neck,  in the region of each carotid bifurcation. Given the reported family history, bilateral glomus tumor would be a distinct consideration. Neck CT with contrast recommended to further evaluate. 2. Known left retroperitoneal lesion is markedly hypermetabolic. 3. No evidence for hypermetabolic disease in the chest or pelvis. No unexpected hypermetabolism in the bony anatomy or lower extremities.  CT scan (10/28/2020): Vascular: Prominently enhancing mass lesions are seen within the carotid sheath bilaterally at the level of the carotid bifurcation with splaying of the ECA-ICA on each side, measuring approximately 2.7 x 1.6 x 1.6 cm on the right and 3 x 2.2 x 1.9 cm on the left. Findings are more consistent with bilateral carotid body tumors.   Limited intracranial: Negative.   Visualized orbits: Negative.   Mastoids and visualized paranasal sinuses: Fluid level within the bilateral maxillary sinuses and mucosal thickening in the bilateral ethmoid cells. Correlate clinically for acute sinusitis.   Skeleton: No acute or aggressive process.   Upper chest: Paraseptal emphysema on the left.   Other: None.   IMPRESSION: 1. Bilateral carotid bifurcation tumors measuring up to 2.7 cm on the right and 3 cm on the left, most consistent with carotid body tumors. 2. Inflammatory paranasal sinus disease. Correlate clinically for acute sinusitis.  Surgery for retroperitoneal mass resection (12/16/2020): A. SOFT TISSUE MASS, RETROPERITONEAL, EXCISION:  - Extra-adrenal paraganglioma, see comment  The lesional cells are positive for synaptophysin, chromogranin and  vimentin with only scattered cells staining for S100.  Cells are  negative for AE1/AE3, Melan-A, HMB45, desmin, CD31, SMA, D2-40, AFP,  glypican-3 and GATA3.  Immunostain for Ki-67 shows a low proliferative  index.  This immunoprofile is consistent with above interpretation.   Surgery for left carotid body tumor resection (02/21/2021)  -Dr. Trula Slade: A. SOFT TISSUE TUMOR, LEFT CAROTID BODY, EXCISION:  - Carotid body paraganglioma, 2.7 cm. - Tumor focally involves margin of specimen.  - See comment.   COMMENT:  The tumor is characterized by epithelioid cells in clusters surrounded  by fibrovascular stroma.  Immunohistochemistry is positive for CD56,  synaptophysin, chromogranin and S100 while GATA3 and cytokeratin AE1/AE3  are negative.  Ki-67 shows a low proliferation rate.  The morphology and  immunophenotype are consistent with extra-adrenal paraganglioma (carotid  body paraganglioma).   Surgery for right carotid body tumor resection (03/07/2021) -Dr. Trula Slade:  A. CAROTID, RIGHT WITH CERVICAL LYMPH NODE, EXCISION:  - Paraganglioma, 1.7 cm, involving the right carotid  - No evidence of necrosis or increased mitotic activity  - Inked surface is positive for tumor  - Benign lymph node   Immunohistochemical stain for Ki-67 shows a low proliferative index of  about 2%.  I reviewed his catecholamine and metanephrine levels: Component     Latest Ref Rng & Units 06/27/2021 (3-4 mo postop)  Metanephrine, Pl     <=57 pg/mL 38  Normetanephrine, Pl     <=148 pg/mL 101  Total Metanephrines-Plasma     <=205 pg/mL 139   Component     Latest Ref Rng & Units 06/27/2021  Epinephrine     pg/mL 50  Norepinephrine     pg/mL 388  Dopamine     pg/mL <10  Total Catecholamines     pg/mL 438  Metanephrine, Pl     <=57 pg/mL 38  Normetanephrine, Pl     <=148 pg/mL 101  Total Metanephrines-Plasma     <=205 pg/mL 139   Component     Latest Ref Rng & Units 09/24/2020 (Preop) 12/27/2020 (11 days postop)  Epinephrine     pg/mL 46 108 (H)  Norepinephrine     pg/mL 1,810 (H) 488  Dopamine     pg/mL 69 (H) 39 (H)  Total Catecholamines     pg/mL 1,925 (H) 635  Metanephrine, Pl     <=57 pg/mL 40 74 (H)  Normetanephrine, Pl     <=148 pg/mL 634 (H) 148  Total Metanephrines-Plasma     <=205 pg/mL 674 (H) 222 (H)   Epinephrine and metanephrine were slightly higher at last visit, possibly in relationship to recent surgery.   Patient's medical history includes: -Lumbar back pain with radiculopathy -Right hip fracture in 2007 s/p ORIF -Sinusitis -Bilateral varicocele and hydrocele  He denies: -Hypertensive spells -Palpitations -Increased sweating -Headaches -Increased anxiety  Reviewed previous blood pressure levels: BP Readings from Last 3 Encounters:  06/27/21 118/82  03/31/21 137/90  03/08/21 135/80   He is not on antihypertensive medications.    Chemistry      Component Value Date/Time   NA 137 03/08/2021 0543   K 4.0 03/08/2021 0543   CL 104 03/08/2021 0543   CO2 23 03/08/2021 0543   BUN 8 03/08/2021 0543   CREATININE 1.17 03/08/2021 0543      Component Value Date/Time   CALCIUM 9.4 03/08/2021 0543   ALKPHOS 67 03/06/2021 1502   AST 30 03/06/2021 1502   ALT 45 (H) 03/06/2021 1502   BILITOT 0.6 03/06/2021 1502     No history of hypokalemia: Lab Results  Component Value Date   K 4.0 03/08/2021   K 3.8 03/07/2021   K 3.9 03/06/2021   K 4.1 03/06/2021   K 3.7 02/22/2021   K 3.7 02/21/2021   K 3.7 02/20/2021   K 4.0 02/19/2021   K 3.7 02/14/2021   K 3.8 12/17/2020   K 3.5 12/16/2020   K 3.6 12/14/2020   K 4.1 12/03/2020   K 3.8 11/27/2020   K 4.6 08/26/2020   K 4.2 08/25/2020   No known history of hypo or hyperthyroidism.  TSH levels were normal: No results found for: "TSH"   No h/o diabetes: 12/21/2008: HbA1c 5.8% No results found for: "HGBA1C"   No known hyperparathyroidism or hypercalcemia: No results found for: "PTH"  Lab Results  Component Value Date   CALCIUM 9.4 03/08/2021   CALCIUM 9.0 03/07/2021   CALCIUM 9.3 03/06/2021   CALCIUM 9.5 03/06/2021   CALCIUM 9.3 02/22/2021   CALCIUM 9.5 02/21/2021   CALCIUM 9.2 02/20/2021   CALCIUM 9.1 02/19/2021   CALCIUM 9.3 02/14/2021   CALCIUM 9.4 12/17/2020   CALCIUM 8.6 (L) 12/16/2020   CALCIUM 9.4  12/14/2020   CALCIUM 9.9 12/03/2020   CALCIUM 9.7 11/27/2020   CALCIUM 9.4 08/26/2020   CALCIUM 9.6 08/25/2020   Pt. denies use of stimulants.   Denies street drug use.   Not on steroids.  He had a spinal block earlier in the year. He stopped smoking -gained some weight afterwards. Previously on gabapentin for back pain due to herniated disc, but stopped.  ROS: + see HPI   Past Medical History:  Diagnosis Date   Family history of pancreatic cancer 09/09/2020   Family history of pheochromocytoma 09/09/2020   Lumbar herniated disc    Monoallelic mutation of SDHD gene 10/01/2020   Past Surgical History:  Procedure Laterality Date  CAROTID BODY TUMOR EXCISION Left 02/21/2021   Procedure: TUMOR EXCISION LEFT CAROTID BODY;  Surgeon: Izora Gala, MD;  Location: [place];  Service: ENT;  Laterality: Left;   HIP SURGERY     IR ANGIO EXTERNAL CAROTID SEL EXT CAROTID UNI R MOD SED  03/06/2021   IR ANGIO INTRA EXTRACRAN SEL COM CAROTID INNOMINATE BILAT MOD SED  02/19/2021   IR ANGIO INTRA EXTRACRAN SEL COM CAROTID INNOMINATE UNI R MOD SED  03/06/2021   IR ANGIO VERTEBRAL SEL SUBCLAVIAN INNOMINATE UNI R MOD SED  03/11/2021   IR ANGIOGRAM FOLLOW UP STUDY  02/19/2021   IR ANGIOGRAM FOLLOW UP STUDY  02/19/2021   IR ANGIOGRAM FOLLOW UP STUDY  02/19/2021   IR ANGIOGRAM FOLLOW UP STUDY  03/06/2021   IR TRANSCATH/EMBOLIZ  02/19/2021   IR TRANSCATH/EMBOLIZ  03/06/2021   MASS BIOPSY Right 03/07/2021   Procedure: RIGHT CAROTID BODY TUMOR EXCISION;  Surgeon: Izora Gala, MD;  Location: [place];  Service: ENT;  Laterality: Right;   RADIOLOGY WITH ANESTHESIA N/A 02/19/2021   Procedure: EMBOLIZATION;  Surgeon: Luanne Bras, MD;  Location: [place];  Service: Radiology;  Laterality: N/A;   RADIOLOGY WITH ANESTHESIA N/A 03/06/2021   Procedure: Right carotid body tumor embolization;  Surgeon: Luanne Bras, MD;  Location: [place];  Service: Radiology;  Laterality: N/A;   RESECTION OF RETROPERITONEAL MASS  N/A 12/16/2020   Procedure: EXCISION OF RETROPERITONEAL MASS PHEOCHROMOCYTOMA;  Surgeon: Dwan Bolt, MD;  Location: [place];  Service: General;  Laterality: N/A;   Social History   Socioeconomic History   Marital status: Married    Spouse name: Not on file   Number of children: 4   Years of education: Not on file   Highest education level: Not on file  Occupational History   Occupation: Delivery driver for [employer]  Tobacco Use   Smoking status: Former    Types: Cigarettes    Quit date: 08/2020    Years since quitting: 1.5   Smokeless tobacco: Never  Vaping Use   Vaping Use: Former  Substance and Sexual Activity   Alcohol use: Yes    Comment: occassionally- "once every 2 weeks"   Drug use: No   Sexual activity: Yes  Other Topics Concern   Not on file  Social History Narrative   Not on file   Social Determinants of Health   Financial Resource Strain: Not on file  Food Insecurity: Not on file  Transportation Needs: Not on file  Physical Activity: Not on file  Stress: Not on file  Social Connections: Not on file  Intimate Partner Violence: Not on file   Current Outpatient Medications on File Prior to Visit  Medication Sig Dispense Refill   acetaminophen (TYLENOL) 500 MG tablet Take 2 tablets (1,000 mg total) by mouth every 8 (eight) hours as needed for mild pain.  0   docusate sodium (COLACE) 100 MG capsule Take 1 capsule (100 mg total) by mouth 2 (two) times daily. (Patient not taking: Reported on 02/10/2021) 60 capsule 0   ibuprofen (ADVIL) 800 MG tablet Take 800 mg by mouth every 8 (eight) hours as needed for headache or moderate pain. (Patient not taking: Reported on 03/31/2021)     methocarbamol (ROBAXIN) 500 MG tablet Take 1 tablet (500 mg total) by mouth every 6 (six) hours as needed for muscle spasms. (Patient not taking: Reported on 02/10/2021) 20 tablet 0   omeprazole (PRILOSEC) 40 MG capsule Take 1 capsule (40 mg total) by mouth daily. (  Patient not taking:  Reported on 12/03/2020) 30 capsule 1   oxyCODONE-acetaminophen (PERCOCET/ROXICET) 5-325 MG tablet Take 1-2 tablets by mouth every 4 (four) hours as needed for moderate pain. For acute post-operative pain (Patient not taking: Reported on 06/27/2021) 20 tablet 0   polyethylene glycol (MIRALAX / GLYCOLAX) 17 g packet Take 17 g by mouth daily as needed (constipation). (Patient not taking: Reported on 03/04/2021) 14 each 0   rosuvastatin (CRESTOR) 40 MG tablet Take 1 tablet (40 mg total) by mouth daily. (Patient not taking: Reported on 06/27/2021) 30 tablet 2   No current facility-administered medications on file prior to visit.   No Known Allergies Family History  Problem Relation Age of Onset   Hypertension Father    Pancreatic cancer Maternal Grandmother        dx early 69s   Prostate cancer Paternal Grandfather        d. 61s   Other Half-Brother        paternal; paraganglioma and pheochromocytoma; SDHD mutation    Other Half-Brother        paternal; ? paraganglioma   PE: BP 106/78 (BP Location: Left Arm, Patient Position: Sitting, Cuff Size: Normal)   Pulse 82   Ht 6' (1.829 m)   Wt 163 lb 12.8 oz (74.3 kg)   SpO2 100%   BMI 22.22 kg/m  Wt Readings from Last 3 Encounters:  02/27/22 163 lb 12.8 oz (74.3 kg)  06/27/21 167 lb 12.8 oz (76.1 kg)  03/31/21 169 lb 3.2 oz (76.7 kg)   Constitutional: normal weight, in NAD Eyes:  EOMI, no exophthalmos ENT: no neck masses, no cervical lymphadenopathy Cardiovascular: RRR, No MRG Respiratory: CTA B Musculoskeletal: no deformities Skin:no rashes Neurological: no tremor with outstretched hands  ASSESSMENT: 1.  Retroperitoneal paraganglioma  2.  Carotid body tumor (paraganglioma  - Family history of pheochromocytoma and paraganglioma  - Positive for SDHD mutation  3.  Elevated blood pressure  PLAN: 1. Retroperitoneal paraganglioma -In 08/2020, patient was incidentally found to have a left retroperitoneal mass measuring 2.9 cm during  investigation for appendicitis.  At that time, upon questioning, he had a family history of paraganglioma in 2 of his brothers and pheochromocytoma in one of his brothers.  He also had hypertension in his father.  Patient himself had stage 0-1 hypertension.  He was referred to a Dietitian.  He had a panel of gene mutations performed (for Li-Fraumeni syndrome, Lynch syndrome, Carney complex, MEN 1, MEN 2, VHL, SDH mutations, etc.).  Results were positive for [place] mutation.  He was referred to endocrinology and surgery. -When I first saw him, his norepinephrine and normetanephrine levels were very elevated, confirming the suspicion for catecholamine producing tumor.  A PET scan was performed and this returned showing increased activity in the left retroperitoneal mass and also bilateral carotids, pointing towards bilateral neck paragangliomas.  -He had resection of the retroperitoneal mass on 12/16/2020 by Dr. Zenia Resides and pathology showed a low Ki-67 index.  She did well after the surgery with metanephrines and catecholamines decreasing and, returning normal at last visit -Dr. Zenia Resides wanted to see him back in 5 months after the previous visit, in 06/2021.  He forgot about this.  We did discuss that, for him, follow-up will be for life with me, but I did advise him to schedule another appointment with surgery to find out their perspective.  Patient will definitely need imaging.  Guidelines recommend a whole-body MRI.  We can check an MRI of the  neck and abdomen, and PET scan could also be performed in the future but will try to avoid radiation exposure, since he is so young.  I discussed with the patient that this plan may change, depending on surgery input. -He has no complaints at this visit -We will repeat his catecholamines and metanephrines today  2.  Carotid body tumor (neck paraganglioma) -These were bilateral, discovered on PET scanning in the setting of investigation for SDHD mutation and also in  the setting of retroperitoneal paraganglioma.  Neck MRI confirmed the presence of the masses.  He was referred to vascular surgery and saw Dr. Trula Slade and Dr. Constance Holster.  He had resection of bilateral paragangliomas, with the left mass resected on 02/21/2021 and the right mass resected on 03/07/2021, after her respective embolization. -I saw him after the surgery, 8 months ago and he was feeling well.  He continues to feel well today.  No complaints. -He is plasma metanephrines and catecholamines were completely normal at last visit -We will repeat these today.  We discussed that if they are elevated, we may need to confirm this with urine testing, which is more specific -At last visit, and again today, we discussed about the capacity of metastasis for such tumor and about further follow-up with annual catecholamines and metanephrines.  -I will see him back in 1 year  3. HTN -He has slightly high blood pressure in the past but this was excellent at last visit  -He was on alpha blockade before the surgeries -Not on medications now.  We can continue without medication since blood pressure is excellent today.  Office Visit on 02/27/2022  Component Date Value Ref Range Status   Metanephrine, Free 02/27/2022 81 (H)  <=57 pg/mL Final   Comment: . This test was developed and its analytical performance characteristics have been determined by [place], [place]. It has not been cleared or approved by the U.S. Food and Drug Administration. This assay has been validated pursuant to the CLIA regulations and is used for clinical purposes. Darol Destine, Free 02/27/2022 164 (H)  <=148 pg/mL Final   Comment: . This test was developed and its analytical performance characteristics have been determined by [place], [place]. It has not been cleared or approved by the U.S. Food and Drug Administration. This assay has been validated  pursuant to the CLIA regulations and is used for clinical purposes. .    Total Metanephrines-Plasma 02/27/2022 245 (H)  <=205 pg/mL Final   Comment: . For additional information, please refer to http://education.questdiagnostics.com/faq/MetFractFree (This link is being provided for informational/educatio informational/educational purposes only.) . Elevations >4-fold upper reference range: strongly suggestive of a pheochromocytoma(1). . Elevations >1- 4-fold upper reference range: significant but not diagnostic, may be due to medications or stress. Suggest running 24 hr urine fractionated metanephrines and/or serum Chromagranin A for confirmation. . Reference: . (1)Algeciras-Schimnich A et al, Plasma Chromogranin A or Urine Fractionated Metanephrines Follow-Up Testing Improves the Diagnostic Accuracy of Plasma Fractionated Metanephrines for Pheochromocytoma. The Journal of Clinical Endocrinology / Metabolism 93(1), 91-95, 2008. . . . This test was developed and its analytical performance characteristics have been determined by [place], [place]. It has not been cleared or a                          pproved by the U.S. Food and Drug Administration. This assay has been validated pursuant to the  CLIA regulations and is used for clinical purposes. .    Addendum: His metanephrines are slightly elevated.  Catecholamine results are now delayed for almost a month. He recently saw Dr. Zenia Resides and she ordered CT scans for follow-up.  Philemon Kingdom, MD PhD [facility] Endocrinology

## 2022-03-11 ENCOUNTER — Other Ambulatory Visit: Payer: Self-pay | Admitting: Surgery

## 2022-03-11 DIAGNOSIS — D447 Neoplasm of uncertain behavior of aortic body and other paraganglia: Secondary | ICD-10-CM

## 2022-03-25 ENCOUNTER — Encounter: Payer: Self-pay | Admitting: Internal Medicine

## 2022-03-25 LAB — CATECHOLAMINES, FRACTIONATED, PLASMA
Dopamine: <10 pg/mL
Epinephrine: 39 pg/mL
Norepinephrine: 524 pg/mL
Total Catecholamines: 563 pg/mL

## 2022-03-25 LAB — METANEPHRINES, PLASMA
Metanephrine, Free: 81 pg/mL — ABNORMAL HIGH (ref <=57)
Normetanephrine, Free: 164 pg/mL — ABNORMAL HIGH (ref <=148)
Total Metanephrines-Plasma: 245 pg/mL — ABNORMAL HIGH (ref <=205)

## 2022-04-15 ENCOUNTER — Encounter: Payer: Self-pay | Admitting: Surgery

## 2022-04-16 ENCOUNTER — Ambulatory Visit
Admission: RE | Admit: 2022-04-16 | Discharge: 2022-04-16 | Disposition: A | Discharge status: Home / Self Care | Payer: BC Managed Care – PPO | Source: Ambulatory Visit | Attending: Surgery | Admitting: Surgery

## 2022-04-16 DIAGNOSIS — D447 Neoplasm of uncertain behavior of aortic body and other paraganglia: Secondary | ICD-10-CM

## 2022-04-16 MED ORDER — IOPAMIDOL (ISOVUE-300) INJECTION 61%
100.0000 mL | Freq: Once | INTRAVENOUS | Status: AC | PRN
  Administered 2022-04-16: 100 mL via INTRAVENOUS

## 2022-11-11 ENCOUNTER — Other Ambulatory Visit: Payer: Self-pay

## 2022-11-11 ENCOUNTER — Encounter (HOSPITAL_BASED_OUTPATIENT_CLINIC_OR_DEPARTMENT_OTHER): Payer: Self-pay

## 2022-11-11 ENCOUNTER — Emergency Department (HOSPITAL_BASED_OUTPATIENT_CLINIC_OR_DEPARTMENT_OTHER): Payer: BC Managed Care – PPO

## 2022-11-11 ENCOUNTER — Emergency Department (HOSPITAL_BASED_OUTPATIENT_CLINIC_OR_DEPARTMENT_OTHER)
Admission: EM | Admit: 2022-11-11 | Discharge: 2022-11-11 | Disposition: A | Discharge status: Home / Self Care | Payer: BC Managed Care – PPO | Attending: Emergency Medicine | Admitting: Emergency Medicine

## 2022-11-11 DIAGNOSIS — M25551 Pain in right hip: Secondary | ICD-10-CM | POA: Insufficient documentation

## 2022-11-11 DIAGNOSIS — R1031 Right lower quadrant pain: Secondary | ICD-10-CM | POA: Diagnosis present

## 2022-11-11 DIAGNOSIS — R7989 Other specified abnormal findings of blood chemistry: Secondary | ICD-10-CM | POA: Insufficient documentation

## 2022-11-11 LAB — CBC WITH DIFFERENTIAL/PLATELET
Abs Immature Granulocytes: 0.01 10*3/uL (ref 0.00–0.07)
Basophils Absolute: 0 10*3/uL (ref 0.0–0.1)
Basophils Relative: 1 %
Eosinophils Absolute: 0.3 10*3/uL (ref 0.0–0.5)
Eosinophils Relative: 5 %
HCT: 42.7 % (ref 39.0–52.0)
Hemoglobin: 13.9 g/dL (ref 13.0–17.0)
Immature Granulocytes: 0 %
Lymphocytes Relative: 36 %
Lymphs Abs: 2.5 10*3/uL (ref 0.7–4.0)
MCH: 26.1 pg (ref 26.0–34.0)
MCHC: 32.6 g/dL (ref 30.0–36.0)
MCV: 80.3 fL (ref 80.0–100.0)
Monocytes Absolute: 0.3 10*3/uL (ref 0.1–1.0)
Monocytes Relative: 4 %
Neutro Abs: 3.7 10*3/uL (ref 1.7–7.7)
Neutrophils Relative %: 54 %
Platelets: 176 10*3/uL (ref 150–400)
RBC: 5.32 MIL/uL (ref 4.22–5.81)
RDW: 15.8 % — ABNORMAL HIGH (ref 11.5–15.5)
Smear Review: INCREASED
WBC: 6.8 10*3/uL (ref 4.0–10.5)
nRBC: 0 % (ref 0.0–0.2)

## 2022-11-11 LAB — COMPREHENSIVE METABOLIC PANEL
ALT: 16 U/L (ref 0–44)
AST: 24 U/L (ref 15–41)
Albumin: 4.1 g/dL (ref 3.5–5.0)
Alkaline Phosphatase: 50 U/L (ref 38–126)
Anion gap: 8 (ref 5–15)
BUN: 10 mg/dL (ref 6–20)
CO2: 24 mmol/L (ref 22–32)
Calcium: 9.2 mg/dL (ref 8.9–10.3)
Chloride: 105 mmol/L (ref 98–111)
Creatinine, Ser: 1.34 mg/dL — ABNORMAL HIGH (ref 0.61–1.24)
GFR, Estimated: >60 mL/min (ref >60)
Glucose, Bld: 85 mg/dL (ref 70–99)
Potassium: 3.9 mmol/L (ref 3.5–5.1)
Sodium: 137 mmol/L (ref 135–145)
Total Bilirubin: 0.9 mg/dL (ref 0.3–1.2)
Total Protein: 7.1 g/dL (ref 6.5–8.1)

## 2022-11-11 LAB — URINALYSIS, ROUTINE W REFLEX MICROSCOPIC
Bilirubin Urine: NEGATIVE
Glucose, UA: NEGATIVE mg/dL
Hgb urine dipstick: NEGATIVE
Ketones, ur: NEGATIVE mg/dL
Leukocytes,Ua: NEGATIVE
Nitrite: NEGATIVE
Protein, ur: NEGATIVE mg/dL
Specific Gravity, Urine: 1.01 (ref 1.005–1.030)
pH: 5.5 (ref 5.0–8.0)

## 2022-11-11 MED ORDER — POLYETHYLENE GLYCOL 3350 17 GM/SCOOP PO POWD: 17.0000 g | Freq: Every day | ORAL | 0 refills | Status: DC

## 2022-11-11 MED ORDER — IOHEXOL 300 MG/ML  SOLN
100.0000 mL | Freq: Once | INTRAMUSCULAR | Status: AC | PRN
  Administered 2022-11-11: 100 mL via INTRAVENOUS

## 2022-11-11 NOTE — ED Provider Notes (Signed)
[place] [facility] Provider Note   CSN: [id number] Arrival date & time: 11/11/22  1551     History  Chief Complaint  Patient presents with   Hip Pain    Kenneth TEMME Sr. is a 42 y.o. male.  Patient with history of paraganglioma status post surgery, history of right hip surgery presents to the emergency department for evaluation of 5 days of intermittent brief, short-lived sharp stabbing pains in the right lower quadrant.  These occur randomly.  They have been persistent.  They last for a few seconds and then resolve.  No nausea, vomiting, diarrhea.  No lightheadedness or syncope.  No scrotal pain.  No hematuria, increased frequency or urgency.       Home Medications Prior to Admission medications   Medication Sig Start Date End Date Taking? Authorizing Provider  acetaminophen (TYLENOL) 500 MG tablet Take 2 tablets (1,000 mg total) by mouth every 8 (eight) hours as needed for mild pain. 12/19/20   Fritzi Mandes, MD  docusate sodium (COLACE) 100 MG capsule Take 1 capsule (100 mg total) by mouth 2 (two) times daily. Patient not taking: Reported on 02/27/2022 12/19/20   Fritzi Mandes, MD  ibuprofen (ADVIL) 800 MG tablet Take 800 mg by mouth every 8 (eight) hours as needed for headache or moderate pain. Patient not taking: Reported on 02/27/2022    [provider]  methocarbamol (ROBAXIN) 500 MG tablet Take 1 tablet (500 mg total) by mouth every 6 (six) hours as needed for muscle spasms. Patient not taking: Reported on 02/27/2022 12/19/20   Fritzi Mandes, MD  omeprazole (PRILOSEC) 40 MG capsule Take 1 capsule (40 mg total) by mouth daily. Patient not taking: Reported on 02/27/2022 08/26/20   Jacinto Halim, PA-C  oxyCODONE-acetaminophen (PERCOCET/ROXICET) 5-325 MG tablet Take 1-2 tablets by mouth every 4 (four) hours as needed for moderate pain. For acute post-operative pain Patient not taking: Reported on 02/27/2022 03/08/21    Milinda Antis, PA-C  polyethylene glycol (MIRALAX / GLYCOLAX) 17 g packet Take 17 g by mouth daily as needed (constipation). Patient not taking: Reported on 02/27/2022 12/19/20   Fritzi Mandes, MD  rosuvastatin (CRESTOR) 40 MG tablet Take 1 tablet (40 mg total) by mouth daily. Patient not taking: Reported on 02/27/2022 02/22/21   Milinda Antis, PA-C      Allergies    Patient has no known allergies.    Review of Systems   Review of Systems  Physical Exam Updated Vital Signs BP 116/79 (BP Location: Left Arm)   Pulse 72   Temp 97.9 F (36.6 C) (Oral)   Resp 16   Wt 73.9 kg   SpO2 99%   BMI 22.11 kg/m   Physical Exam Vitals and nursing note reviewed.  Constitutional:      General: He is not in acute distress.    Appearance: He is well-developed.  HENT:     Head: Normocephalic and atraumatic.     Nose: Nose normal.     Mouth/Throat:     Mouth: Mucous membranes are moist.  Eyes:     General:        Right eye: No discharge.        Left eye: No discharge.     Conjunctiva/sclera: Conjunctivae normal.  Cardiovascular:     Rate and Rhythm: Normal rate and regular rhythm.     Heart sounds: Normal heart sounds.  Pulmonary:     Effort: Pulmonary  effort is normal.     Breath sounds: Normal breath sounds.  Abdominal:     Palpations: Abdomen is soft.     Tenderness: There is abdominal tenderness (mild, RLQ). There is no guarding or rebound.  Musculoskeletal:     Cervical back: Normal range of motion and neck supple.  Skin:    General: Skin is warm and dry.  Neurological:     Mental Status: He is alert.     ED Results / Procedures / Treatments   Labs (all labs ordered are listed, but only abnormal results are displayed) Labs Reviewed  CBC WITH DIFFERENTIAL/PLATELET  COMPREHENSIVE METABOLIC PANEL  URINALYSIS, ROUTINE W REFLEX MICROSCOPIC    EKG None  Radiology No results found.  Procedures Procedures    Medications Ordered in ED Medications - No data  to display  ED Course/ Medical Decision Making/ A&P    Patient seen and examined. History obtained directly from patient.   Discussed how to proceed.  We discussed options of close monitoring and over-the-counter medications versus lab work alone, versus lab work with imaging.  Given his history of paraganglioma and persistent symptoms, not improving over the past 5 days, patient prefers to have imaging performed to evaluate for appendicitis or other problem.  Labs/EKG: Ordered CBC, CMP, UA.  Imaging: Ordered CT abdomen pelvis.  Medications/Fluids: Ordered: None ordered.   Most recent vital signs reviewed and are as follows: BP 116/79 (BP Location: Left Arm)   Pulse 72   Temp 97.9 F (36.6 C) (Oral)   Resp 16   Wt 73.9 kg   SpO2 99%   BMI 22.11 kg/m   Initial impression: Intermittent right lower quadrant abdominal pain  6:35 PM Reassessment performed. Patient appears very comfortable.  Exam unchanged.  Labs personally reviewed and interpreted including: CBC unremarkable; CMP minimally elevated creatinine at 1.34; urinalysis unremarkable.  Imaging personally visualized and interpreted including: CT abdomen pelvis, no acute findings, moderate stool burden right abdomen.  Reviewed pertinent lab work and imaging with patient at bedside. Questions answered.   Most current vital signs reviewed and are as follows: BP 116/79 (BP Location: Left Arm)   Pulse 72   Temp 97.9 F (36.6 C) (Oral)   Resp 16   Wt 73.9 kg   SpO2 99%   BMI 22.11 kg/m   Plan: Discharge to home.   Prescriptions written for: MiraLAX  Other home care instructions discussed: OTC meds for pain  ED return instructions discussed: The patient was urged to return to the Emergency Department immediately with worsening of current symptoms, worsening abdominal pain, persistent vomiting, blood noted in stools, fever, or any other concerns. The patient verbalized understanding.   Follow-up instructions  discussed: Patient encouraged to follow-up with their PCP as needed.                            Medical Decision Making Amount and/or Complexity of Data Reviewed Labs: ordered. Radiology: ordered.   For this patient's complaint of abdominal pain, the following conditions were considered on the differential diagnosis: gastritis/PUD, enteritis/duodenitis, appendicitis, cholelithiasis/cholecystitis, cholangitis, pancreatitis, ruptured viscus, colitis, diverticulitis, small/large bowel obstruction, proctitis, cystitis, pyelonephritis, ureteral colic, aortic dissection, aortic aneurysm. Atypical chest etiologies were also considered including ACS, PE, and pneumonia.  The patient's vital signs, pertinent lab work and imaging were reviewed and interpreted as discussed in the ED course. Hospitalization was considered for further testing, treatments, or serial exams/observation. However as patient is well-appearing, has  a stable exam, and reassuring studies today, I do not feel that they warrant admission at this time. This plan was discussed with the patient who verbalizes agreement and comfort with this plan and seems reliable and able to return to the Emergency Department with worsening or changing symptoms.         Final Clinical Impression(s) / ED Diagnoses Final diagnoses:  RLQ abdominal pain    Rx / DC Orders ED Discharge Orders          Ordered    polyethylene glycol powder (GLYCOLAX/MIRALAX) 17 GM/SCOOP powder  Daily        11/11/22 1833              Renne Crigler, PA-C 11/11/22 1836    Charlynne Pander, MD 11/11/22 2229

## 2022-11-11 NOTE — ED Notes (Signed)
Reviewed discharge instructions, medications and recommendations with pt.  Ambulatory at discharge

## 2022-11-11 NOTE — ED Triage Notes (Signed)
Pt arrives with c/o right sided hip pain that started last week.Pt denies injury.

## 2022-11-11 NOTE — Discharge Instructions (Signed)
Please read and follow all provided instructions.  Your diagnoses today include:  1. RLQ abdominal pain     Tests performed today include: Blood cell counts and platelets Kidney and liver function tests Urine test to look for infection CT scan of the abdomen does not show any acute concerns or findings, moderate stool burden on right side of the abdomen possibly contributing Vital signs. See below for your results today.   Medications prescribed:  Miralax - laxative  This medication can be found over-the-counter.   Take any prescribed medications only as directed.  Home care instructions:  Follow any educational materials contained in this packet.  Follow-up instructions: Please follow-up with your primary care provider in the next 3 days for further evaluation of your symptoms.    Return instructions:  SEEK IMMEDIATE MEDICAL ATTENTION IF: The pain does not go away or becomes severe  A temperature above 101F develops  Repeated vomiting occurs (multiple episodes)  The pain becomes localized to portions of the abdomen. The right side could possibly be appendicitis. In an adult, the left lower portion of the abdomen could be colitis or diverticulitis.  Blood is being passed in stools or vomit (bright red or black tarry stools)  You develop chest pain, difficulty breathing, dizziness or fainting, or become confused, poorly responsive, or inconsolable (young children) If you have any other emergent concerns regarding your health  Additional Information: Abdominal (belly) pain can be caused by many things. Your caregiver performed an examination and possibly ordered blood/urine tests and imaging (CT scan, x-rays, ultrasound). Many cases can be observed and treated at home after initial evaluation in the emergency department. Even though you are being discharged home, abdominal pain can be unpredictable. Therefore, you need a repeated exam if your pain does not resolve, returns, or  worsens. Most patients with abdominal pain don't have to be admitted to the hospital or have surgery, but serious problems like appendicitis and gallbladder attacks can start out as nonspecific pain. Many abdominal conditions cannot be diagnosed in one visit, so follow-up evaluations are very important.  Your vital signs today were: BP 116/79 (BP Location: Left Arm)   Pulse 72   Temp 97.9 F (36.6 C) (Oral)   Resp 16   Wt 73.9 kg   SpO2 99%   BMI 22.11 kg/m  If your blood pressure (bp) was elevated above 135/85 this visit, please have this repeated by your doctor within one month. --------------

## 2023-03-02 ENCOUNTER — Ambulatory Visit: Payer: BC Managed Care – PPO | Admitting: Internal Medicine

## 2023-03-23 ENCOUNTER — Ambulatory Visit: Payer: 59 | Admitting: Internal Medicine

## 2023-03-23 ENCOUNTER — Encounter: Payer: Self-pay | Admitting: Internal Medicine

## 2023-03-23 VITALS — BP 142/80 | HR 81 | Ht 72.0 in | Wt 154.6 lb

## 2023-03-23 DIAGNOSIS — I10 Essential (primary) hypertension: Secondary | ICD-10-CM

## 2023-03-23 DIAGNOSIS — D447 Neoplasm of uncertain behavior of aortic body and other paraganglia: Secondary | ICD-10-CM | POA: Diagnosis not present

## 2023-03-23 DIAGNOSIS — D446 Neoplasm of uncertain behavior of carotid body: Secondary | ICD-10-CM

## 2023-03-23 NOTE — Progress Notes (Signed)
Patient ID: Kenneth Presser Sr., male   DOB: 23-Aug-1980, 42 y.o.   MRN: [id number]   HPI  Kenneth CLOVER Sr. is a 42 y.o.-year-old male, initially referred by Dr. 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   Last visit 1 year ago.  Interim history: He denies headaches, palpitations, significant weight changes, upper abdominal pain. He continues to have back pain. He was started on Lipitor by PCP in 12/2022. He had an episode of abdominal pain in 11/2022 likely due to constipation.  History summary: Patient had abdominal paraganglioma resection (12/2020) by Dr. Sophronia Simas.  Afterwards, he had left and right carotid body tumor resection by Dr. Myra Gianotti (02/21/2021 and 03/07/2021, respectively)  -with previous evaluation by ENT (Dr. Pollyann Kennedy). He worked with the geneticist Kenneth Blackwell -to also have his children tested (by Dr. Roetta Blackwell at [facility]).  He has 3 boys (one of them - boy -was tested and the results are negative, the other 2 -girls- need to wait until 41 years old), and 1 girl (living in [place]) - not tested yet.  Reviewed history: Patient presented to the ED on 08/25/2020 for abdominal pain.  At that time, he was found to have mild appendicitis and treated with antibiotics.  However, during work-up for this, he had a CT scan showing: 2.9 cm enhancing left retroperitoneal mass, in proximity to the distal duodenum. Primary considerations include neurogenic tumor, extra-adrenal pheochromocytoma, less likely gastrointestinal stromal tumor or neuroendocrine tumor:   Upon questioning, patient has a family history of: - pericarotid paraganglioma (unresectable) and pheochromocytoma (hormonally active) in a brother (47 years old) - he was in a study at [facility] and had genetic testing >> father is carrier - neck paraganglioma (silent) in another brother 41 years old) -  HTN in father (96 years old) - Pancreatic cancer in maternal grandmother (diagnosed in her early 38s)  Due to his family history, he was referred for genetic counseling.  He had this appointment on 09/09/2020. Blood was sent for a mutation panel containing 84 genes including: MEN1, RET, VHL, SDHx, MAX, TMEM127, PRKAR1A, Tp53, MLH and MSH.  The results were positive for SDHD mutation.  I referred him for a PET scan to screen for other catecholamine producing tumors (10/16/2020):  Mediastinal blood pool activity: SUV max 2.1   HEAD/NECK: Bilateral hypermetabolic lesions are identified in the deep neck bilaterally, in the region of the carotid bifurcation although vascular anatomy not well demonstrated on noncontrast CT imaging. Lesion on the left measures approximately 1.9 x 1.8 cm, but again, is not well demonstrated given lack of intravenous contrast. This lesion demonstrates SUV max = 20.8. The lesion on the right measures approximately 2.0 x 1.3 cm with SUV max = 13.2.   Incidental CT findings: none   CHEST: No hypermetabolic mediastinal or hilar nodes. No suspicious pulmonary nodules on the CT scan.   Incidental CT findings: none   ABDOMEN/PELVIS: No abnormal hypermetabolic activity within the liver, pancreas, adrenal glands, or spleen. No hypermetabolic lymph nodes in the abdomen or pelvis.The 2.9 cm left retroperitoneal lesion described on previous CT scan is markedly hypermetabolic with SUV max = 17.4.   Incidental CT findings: none   SKELETON: No focal hypermetabolic activity to suggest skeletal metastasis.   Incidental CT findings: Air-fluid levels noted in the maxillary sinuses bilaterally with opacification of scattered ethmoid air cells. Status post pin placement right femoral neck.  EXTREMITIES: No abnormal hypermetabolic activity in the lower extremities.   Incidental CT findings: none   IMPRESSION: 1. Bilateral hypermetabolic soft tissue lesions in the neck,  in the region of each carotid bifurcation. Given the reported family history, bilateral glomus tumor would be a distinct consideration. Neck CT with contrast recommended to further evaluate. 2. Known left retroperitoneal lesion is markedly hypermetabolic. 3. No evidence for hypermetabolic disease in the chest or pelvis. No unexpected hypermetabolism in the bony anatomy or lower extremities.  CT scan (10/28/2020): Vascular: Prominently enhancing mass lesions are seen within the carotid sheath bilaterally at the level of the carotid bifurcation with splaying of the ECA-ICA on each side, measuring approximately 2.7 x 1.6 x 1.6 cm on the right and 3 x 2.2 x 1.9 cm on the left. Findings are more consistent with bilateral carotid body tumors.   Limited intracranial: Negative.   Visualized orbits: Negative.   Mastoids and visualized paranasal sinuses: Fluid level within the bilateral maxillary sinuses and mucosal thickening in the bilateral ethmoid cells. Correlate clinically for acute sinusitis.   Skeleton: No acute or aggressive process.   Upper chest: Paraseptal emphysema on the left.   Other: None.   IMPRESSION: 1. Bilateral carotid bifurcation tumors measuring up to 2.7 cm on the right and 3 cm on the left, most consistent with carotid body tumors. 2. Inflammatory paranasal sinus disease. Correlate clinically for acute sinusitis.  Surgery for retroperitoneal mass resection (12/16/2020): A. SOFT TISSUE MASS, RETROPERITONEAL, EXCISION:  - Extra-adrenal paraganglioma, see comment  The lesional cells are positive for synaptophysin, chromogranin and  vimentin with only scattered cells staining for S100.  Cells are  negative for AE1/AE3, Melan-A, HMB45, desmin, CD31, SMA, D2-40, AFP,  glypican-3 and GATA3.  Immunostain for Ki-67 shows a low proliferative  index.  This immunoprofile is consistent with above interpretation.   Surgery for left carotid body tumor resection (02/21/2021)  -Dr. Myra Gianotti: A. SOFT TISSUE TUMOR, LEFT CAROTID BODY, EXCISION:  - Carotid body paraganglioma, 2.7 cm. - Tumor focally involves margin of specimen.  - See comment.   COMMENT:  The tumor is characterized by epithelioid cells in clusters surrounded  by fibrovascular stroma.  Immunohistochemistry is positive for CD56,  synaptophysin, chromogranin and S100 while GATA3 and cytokeratin AE1/AE3  are negative.  Ki-67 shows a low proliferation rate.  The morphology and  immunophenotype are consistent with extra-adrenal paraganglioma (carotid  body paraganglioma).   Surgery for right carotid body tumor resection (03/07/2021) -Dr. Myra Gianotti:  A. CAROTID, RIGHT WITH CERVICAL LYMPH NODE, EXCISION:  - Paraganglioma, 1.7 cm, involving the right carotid  - No evidence of necrosis or increased mitotic activity  - Inked surface is positive for tumor  - Benign lymph node   Immunohistochemical stain for Ki-67 shows a low proliferative index of  about 2%.  CT chest/abdomen/pelvis (04/16/2022): 1. Interval surgical resection of the left retroperitoneal mass without evidence of local recurrence. 2. No evidence of new or progressive disease within the chest, abdomen, or pelvis. 3. Moderate volume of formed stool in the colon suggestive of constipation. 4. Aortic Atherosclerosis (ICD10-I70.0) and Emphysema (ICD10-J43.9).  CT soft tissue neck (04/16/2022): Radiopaque liquid embolic material near both carotid bifurcations, a sequela of pre-surgical embolization of carotid body tumors. No visible recurrent or residual mass.  CT abdomen and pelvis (11/11/2022): Lower chest: The visualized lung bases are clear.   No intra-abdominal free air or free fluid.   Hepatobiliary: The liver is unremarkable. No biliary dilatation. The gallbladder is unremarkable.  Pancreas: Unremarkable. No pancreatic ductal dilatation or surrounding inflammatory changes.   Spleen: Normal in size without focal abnormality.    Adrenals/Urinary Tract: The adrenal glands are unremarkable. The kidneys, visualized ureters, and urinary bladder appear unremarkable.   Stomach/Bowel: There is no bowel obstruction or active inflammation. Normal appendix.   Vascular/Lymphatic: Mild aortoiliac atherosclerotic disease. The IVC is unremarkable. No portal venous gas. There is no adenopathy. Small area of retroperitoneal calcification to the left of the abdominal aorta, sequela prior surgery. No suspicious lesion.   Reproductive: The prostate and seminal vesicles are grossly unremarkable. No pelvic mass.   Other: None   Musculoskeletal: Status post prior internal fixation of the right femoral neck. No acute osseous pathology.   IMPRESSION: 1. No acute intra-abdominal or pelvic pathology. Normal appendix. 2.  Aortic Atherosclerosis (ICD10-I70.0).  I reviewed his catecholamine and metanephrine levels: Component     Latest Ref Rng 02/27/2022  Epinephrine     pg/mL 39   Norepinephrine     pg/mL 524   Dopamine     pg/mL <10   Total Catecholamines     pg/mL 563   Metanephrine, Pl     <=57 pg/mL 81 (H)   Normetanephrine, Pl     <=148 pg/mL 164 (H)   Total Metanephrines-Plasma     <=205 pg/mL 245 (H)     Component     Latest Ref Rng & Units 06/27/2021 (3-4 mo postop)  Metanephrine, Pl     <=57 pg/mL 38  Normetanephrine, Pl     <=148 pg/mL 101  Total Metanephrines-Plasma     <=205 pg/mL 139   Component     Latest Ref Rng & Units 06/27/2021  Epinephrine     pg/mL 50  Norepinephrine     pg/mL 388  Dopamine     pg/mL <10  Total Catecholamines     pg/mL 438  Metanephrine, Pl     <=57 pg/mL 38  Normetanephrine, Pl     <=148 pg/mL 101  Total Metanephrines-Plasma     <=205 pg/mL 139   Component     Latest Ref Rng & Units 09/24/2020 (Preop) 12/27/2020 (11 days postop)  Epinephrine     pg/mL 46 108 (H)  Norepinephrine     pg/mL 1,810 (H) 488  Dopamine     pg/mL 69 (H) 39 (H)  Total  Catecholamines     pg/mL 1,925 (H) 635  Metanephrine, Pl     <=57 pg/mL 40 74 (H)  Normetanephrine, Pl     <=148 pg/mL 634 (H) 148  Total Metanephrines-Plasma     <=205 pg/mL 674 (H) 222 (H)  Epinephrine and metanephrine were slightly higher at last visit, possibly in relationship to recent surgery.   Patient's medical history includes: -Lumbar back pain with radiculopathy -Right hip fracture in 2007 s/p ORIF -Sinusitis -Bilateral varicocele and hydrocele  He denies: -Hypertensive spells -Palpitations -Increased sweating -Headaches -Increased anxiety  Reviewed previous blood pressure levels: BP Readings from Last 3 Encounters:  11/11/22 (!) 127/95  02/27/22 106/78  06/27/21 118/82   He is not on antihypertensive medications.    Chemistry      Component Value Date/Time   NA 137 11/11/2022 1628   K 3.9 11/11/2022 1628   CL 105 11/11/2022 1628   CO2 24 11/11/2022 1628   BUN 10 11/11/2022 1628   CREATININE 1.34 (H) 11/11/2022 1628      Component Value Date/Time   CALCIUM 9.2 11/11/2022 1628   ALKPHOS 50 11/11/2022 1628  AST 24 11/11/2022 1628   ALT 16 11/11/2022 1628   BILITOT 0.9 11/11/2022 1628     No history of hypokalemia: Lab Results  Component Value Date   K 3.9 11/11/2022   K 4.0 03/08/2021   K 3.8 03/07/2021   K 3.9 03/06/2021   K 4.1 03/06/2021   K 3.7 02/22/2021   K 3.7 02/21/2021   K 3.7 02/20/2021   K 4.0 02/19/2021   K 3.7 02/14/2021   K 3.8 12/17/2020   K 3.5 12/16/2020   K 3.6 12/14/2020   K 4.1 12/03/2020   K 3.8 11/27/2020   K 4.6 08/26/2020   K 4.2 08/25/2020   No known history of hypo or hyperthyroidism. No results found for: "TSH"   No h/o diabetes: 12/21/2008: HbA1c 5.8% No results found for: "HGBA1C"   No known hyperparathyroidism or hypercalcemia: No results found for: "PTH"  Lab Results  Component Value Date   CALCIUM 9.2 11/11/2022   CALCIUM 9.4 03/08/2021   CALCIUM 9.0 03/07/2021   CALCIUM 9.3 03/06/2021    CALCIUM 9.5 03/06/2021   CALCIUM 9.3 02/22/2021   CALCIUM 9.5 02/21/2021   CALCIUM 9.2 02/20/2021   CALCIUM 9.1 02/19/2021   CALCIUM 9.3 02/14/2021   CALCIUM 9.4 12/17/2020   CALCIUM 8.6 (L) 12/16/2020   CALCIUM 9.4 12/14/2020   CALCIUM 9.9 12/03/2020   CALCIUM 9.7 11/27/2020   CALCIUM 9.4 08/26/2020   CALCIUM 9.6 08/25/2020   Pt. denies use of stimulants.   Denies street drug use.   Not on steroids.  He had a spinal block earlier in the year. He stopped smoking -gained some weight afterwards. Previously on gabapentin for back pain due to herniated disc, but stopped.  ROS: + see HPI   Past Medical History:  Diagnosis Date   Family history of pancreatic cancer 09/09/2020   Family history of pheochromocytoma 09/09/2020   Lumbar herniated disc    Monoallelic mutation of SDHD gene 10/01/2020   Past Surgical History:  Procedure Laterality Date   CAROTID BODY TUMOR EXCISION Left 02/21/2021   Procedure: TUMOR EXCISION LEFT CAROTID BODY;  Surgeon: Serena Colonel, MD;  Location: [facility] OR;  Service: ENT;  Laterality: Left;   HIP SURGERY     IR ANGIO EXTERNAL CAROTID SEL EXT CAROTID UNI R MOD SED  03/06/2021   IR ANGIO INTRA EXTRACRAN SEL COM CAROTID INNOMINATE BILAT MOD SED  02/19/2021   IR ANGIO INTRA EXTRACRAN SEL COM CAROTID INNOMINATE UNI R MOD SED  03/06/2021   IR ANGIO VERTEBRAL SEL SUBCLAVIAN INNOMINATE UNI R MOD SED  03/11/2021   IR ANGIOGRAM FOLLOW UP STUDY  02/19/2021   IR ANGIOGRAM FOLLOW UP STUDY  02/19/2021   IR ANGIOGRAM FOLLOW UP STUDY  02/19/2021   IR ANGIOGRAM FOLLOW UP STUDY  03/06/2021   IR TRANSCATH/EMBOLIZ  02/19/2021   IR TRANSCATH/EMBOLIZ  03/06/2021   MASS BIOPSY Right 03/07/2021   Procedure: RIGHT CAROTID BODY TUMOR EXCISION;  Surgeon: Serena Colonel, MD;  Location: [facility] OR;  Service: ENT;  Laterality: Right;   RADIOLOGY WITH ANESTHESIA N/A 02/19/2021   Procedure: EMBOLIZATION;  Surgeon: Julieanne Cotton, MD;  Location: [facility] OR;  Service: Radiology;  Laterality: N/A;    RADIOLOGY WITH ANESTHESIA N/A 03/06/2021   Procedure: Right carotid body tumor embolization;  Surgeon: Julieanne Cotton, MD;  Location: [facility] OR;  Service: Radiology;  Laterality: N/A;   RESECTION OF RETROPERITONEAL MASS N/A 12/16/2020   Procedure: EXCISION OF RETROPERITONEAL MASS PHEOCHROMOCYTOMA;  Surgeon: Fritzi Mandes, MD;  Location: [facility] OR;  Service: General;  Laterality: N/A;   Social History   Socioeconomic History   Marital status: Married    Spouse name: Not on file   Number of children: 4   Years of education: Not on file   Highest education level: Not on file  Occupational History   Occupation: Delivery driver for [employer]  Tobacco Use   Smoking status: Former    Current packs/day: 0.00    Types: Cigarettes    Quit date: 08/2020    Years since quitting: 2.6   Smokeless tobacco: Never  Vaping Use   Vaping status: Former  Substance and Sexual Activity   Alcohol use: Yes    Comment: occassionally- "once every 2 weeks"   Drug use: No   Sexual activity: Yes  Other Topics Concern   Not on file  Social History Narrative   Not on file   Social Determinants of Health   Financial Resource Strain: Not on file  Food Insecurity: Not on file  Transportation Needs: Not on file  Physical Activity: Not on file  Stress: Not on file  Social Connections: Unknown (09/05/2021)   Received from [facility], [facility]   Social Network    Social Network: Not on file  Intimate Partner Violence: Unknown (08/04/2021)   [facility], [facility]   HITS    Physically Hurt: Not on file    Insult or Talk Down To: Not on file    Threaten Physical Harm: Not on file    Scream or Curse: Not on file   Current Outpatient Medications on File Prior to Visit  Medication Sig Dispense Refill   acetaminophen (TYLENOL) 500 MG tablet Take 2 tablets (1,000 mg total) by mouth every 8 (eight) hours as needed for mild pain.  0   polyethylene glycol powder (GLYCOLAX/MIRALAX) 17  GM/SCOOP powder Take 17 g by mouth daily. 255 g 0   No current facility-administered medications on file prior to visit.   No Known Allergies Family History  Problem Relation Age of Onset   Hypertension Father    Pancreatic cancer Maternal Grandmother        dx early 65s   Prostate cancer Paternal Grandfather        d. 12s   Other Half-Brother        paternal; paraganglioma and pheochromocytoma; SDHD mutation    Other Half-Brother        paternal; ? paraganglioma   PE: BP (!) 142/80 Comment: Checked BP x 2  Pulse 81   Ht 6' (1.829 m)   Wt 154 lb 9.6 oz (70.1 kg)   SpO2 99%   BMI 20.97 kg/m  He was rushing here today. Wt Readings from Last 3 Encounters:  03/23/23 154 lb 9.6 oz (70.1 kg)  11/11/22 163 lb (73.9 kg)  02/27/22 163 lb 12.8 oz (74.3 kg)   Constitutional: normal weight, in NAD Eyes:  EOMI, no exophthalmos ENT: no neck masses, no cervical lymphadenopathy Cardiovascular: RRR, No MRG Respiratory: CTA B Musculoskeletal: no deformities Skin:no rashes Neurological: no tremor with outstretched hands  ASSESSMENT: 1.  Retroperitoneal paraganglioma  2.  Carotid body tumor (paraganglioma  - Family history of pheochromocytoma and paraganglioma  - Positive for SDHD mutation  3.  Elevated blood pressure  PLAN: 1. Retroperitoneal paraganglioma -In 08/2020, patient was incidentally found to have a left retroperitoneal mass measuring 2.9 cm during investigation for appendicitis.  At that time, upon questioning, he had a family history of  paraganglioma in 2 of his brothers and pheochromocytoma in one of his brothers.  He also had hypertension in his father.  Patient himself had stage 0-1 hypertension.  He was referred to a Dentist.  He had a panel of gene mutations performed (for Li-Fraumeni syndrome, Lynch syndrome, Carney complex, MEN 1, MEN 2, VHL, SDH mutations, etc.).  Results were positive for SDHD mutation.  He was referred to endocrinology and  surgery. -When I Blackwell saw him, his norepinephrine and normetanephrine levels were very elevated, confirming the suspicion for catecholamine producing tumor.  A PET scan was performed and this returned showing increased activity in the left retroperitoneal mass and also bilateral carotids, pointing towards bilateral neck paragangliomas.  -He had resection of the retroperitoneal mass on 12/16/2020 by Dr. Freida Busman and pathology showed a low Ki-67 index.   -He had an abdominal/pelvic CT scan 04/16/2022 showing no persistent or recurrent paraganglioma or any signs of metastasis.  At the same time, he had a CT of the neck which also did not show any persistent masses.  He had another CT scan 11/11/2022 (after he presented to the emergency room with abdominal pain) showing no persistent or recurrent paraganglioma.  I do not feel that we need to repeat abdominal imaging tests for now, but I would suggest a neck MRI a year from the previous. -At today's visit, we will order another set of metanephrines and continue to check these annually -Going forward, per guidelines, a whole-body MRI would be indicated in 2 years.  If this is not covered, we will proceed with head/neck + abdominal/pelvis MRI at.  If this is negative for recurrence, plan to repeat the test in 2 years. -His son was investigated for the mutation and he was negative.  He also has 2 daughters, 1 younger than 37 years old (the threshold for testing is 41 years old for this mutation) and the other 41 y/o (lives in [place] with her mother).  He did discuss with the mother about testing her and they will proceed with this. -I will see him back in a year  2.  Carotid body tumor (neck paraganglioma) -These were bilateral, discovered on PET scanning in the setting of investigation for SDHD mutation and also in the setting of retroperitoneal paraganglioma.  Neck MRI confirmed the presence of the masses.  He was referred to vascular surgery and saw Dr. Myra Gianotti and  Dr. Pollyann Kennedy.  He had resection of bilateral paragangliomas, with the left mass resected on 02/21/2021 and the right mass resected on 03/07/2021, after her respective embolization. -At today's visit, he feels well, without complaints -His plasma catecholamines were normal while the metanephrines were nonspecifically mildly elevated at last check -At today's visit, we discussed about the potential for recurrence/metastasis, which is lower for this mutation (<4%) than for SDHB mutation, but not zero: Plan to have him back for plasma metanephrines once a year.  If these are elevated, we will get urine metanephrines, which are more specific.  Catecholamines are not recommended for follow-up anymore.  He also needs imaging every 1-2 years (whole-body MRI or, alternatively, if not covered, head/neck + abdomen/pelvis MRI).  For now we will try to obtain a cervical MRI (also see problem above). -I will see him back in 1 year  3. HTN -Slight elevated blood pressure in the past but normal at last visit -He was on alpha blockade before the surgeries -At today's visit blood pressure is slightly high, but patient mentions that he was rushing  over here.  -I advised him to check his blood sugars at home and we discussed that 130 over 80s to threshold for hypertension.  If his blood pressure is higher than this consistently, he may need to start antihypertensives.  Orders Placed This Encounter  Procedures   MR NECK SOFT TISSUE W CONTRAST   Metanephrines, plasma   - Total time spent for the visit: 40 min, in precharting, postcharting, reviewing records from [facility], obtaining medical information from the chart (MyChart and Care Everywhere) and from the pt., reviewing his  previous labs, imaging evaluations, and treatments, reviewing his symptoms, counseling him about his endocrine conditions (please see the discussed topics above), and developing a plan to further investigate and treat them.  MRI was scheduled for  04/16/2023.   Component     Latest Ref Rng 03/23/2023  Metanephrine, Pl     <=57 pg/mL 55   Normetanephrine, Pl     <=148 pg/mL 73   Total Metanephrines-Plasma     <=205 pg/mL 128   Metanephrines are all normal.   Carlus Pavlov, MD PhD [facility]

## 2023-03-23 NOTE — Patient Instructions (Addendum)
Please stop at the lab.  Let's check a neck MRI.  Please return in 1 year.

## 2023-03-28 LAB — METANEPHRINES, PLASMA
Metanephrine, Free: 55 pg/mL (ref <=57)
Normetanephrine, Free: 73 pg/mL (ref <=148)
Total Metanephrines-Plasma: 128 pg/mL (ref <=205)

## 2023-04-16 ENCOUNTER — Ambulatory Visit
Admission: RE | Admit: 2023-04-16 | Discharge: 2023-04-16 | Disposition: A | Discharge status: Home / Self Care | Payer: 59 | Source: Ambulatory Visit | Attending: Internal Medicine | Admitting: Internal Medicine

## 2023-04-16 DIAGNOSIS — D446 Neoplasm of uncertain behavior of carotid body: Secondary | ICD-10-CM

## 2023-04-16 MED ORDER — GADOPICLENOL 0.5 MMOL/ML IV SOLN
7.5000 mL | Freq: Once | INTRAVENOUS | Status: AC | PRN
  Administered 2023-04-16: 7.5 mL via INTRAVENOUS

## 2023-05-03 ENCOUNTER — Encounter: Payer: Self-pay | Admitting: Internal Medicine

## 2023-10-29 ENCOUNTER — Encounter (HOSPITAL_COMMUNITY): Payer: Self-pay | Admitting: Interventional Radiology

## 2024-03-21 ENCOUNTER — Ambulatory Visit: Payer: 59 | Admitting: Internal Medicine

## 2024-03-21 ENCOUNTER — Other Ambulatory Visit

## 2024-03-21 ENCOUNTER — Encounter: Payer: Self-pay | Admitting: Internal Medicine

## 2024-03-21 VITALS — BP 128/70 | HR 76 | Ht 72.0 in | Wt 164.4 lb

## 2024-03-21 DIAGNOSIS — I10 Essential (primary) hypertension: Secondary | ICD-10-CM

## 2024-03-21 DIAGNOSIS — Z15068 Genetic susceptibility to other malignant neoplasm of digestive system: Secondary | ICD-10-CM

## 2024-03-21 DIAGNOSIS — D446 Neoplasm of uncertain behavior of carotid body: Secondary | ICD-10-CM | POA: Diagnosis not present

## 2024-03-21 DIAGNOSIS — D447 Neoplasm of uncertain behavior of aortic body and other paraganglia: Secondary | ICD-10-CM | POA: Diagnosis not present

## 2024-03-21 DIAGNOSIS — Z1589 Genetic susceptibility to other disease: Secondary | ICD-10-CM

## 2024-03-21 NOTE — Progress Notes (Addendum)
 Patient ID: Kenneth LITTIE Louder Sr., male   DOB: May 28, 1980, 43 y.o.   MRN: [id number]   HPI  Kenneth PAULEY Sr. is a 43 y.o.-year-old male, initially referred by Dr. 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   Last visit 1 year ago.  Interim history: He denies headaches, palpitations, significant weight changes, upper abdominal pain. He continues to have back pain. Patient presented to the emergency room with cramping chest pain 02/28/2024.  Troponin, CBC, and CMP were normal and the pain was assumed to be noncardiac.  He was recommended NSAIDs.  Of note, he was started on Lipitor by PCP in 12/2022. During his ED visit he also had a chest CT and this did not show any upper abdominal abnormalities. He has 2 younger brothers, both with the same mutation and also with paraganglioma. His middle brother was just dx'ed with a cardiac tumor.  He does not have any details about this. Since last visit, he had increased stress -got a divorce and moved to [place].  History summary: Patient had abdominal paraganglioma resection (12/2020) by Dr. Leonor Dawn.  Afterwards, he had left and right carotid body tumor resection by Dr. Serene (02/21/2021 and 03/07/2021, respectively)  -with previous evaluation by ENT (Dr. Jesus). He worked with the geneticist Kirk Orleans -to also have his children tested (by Dr. Georgianna at [facility]).  He has 3 boys (one of them - boy -was tested and the results are negative, the other 2 -girls- need to wait until 42 years old), and 1 girl (living in [place] ) - not tested yet.  Reviewed history: Patient presented to the ED on 08/25/2020 for abdominal pain.  At that time, he was found to have mild appendicitis and treated with antibiotics.  However, during work-up for this, he had a CT scan showing: 2.9 cm enhancing left retroperitoneal mass, in proximity to  the distal duodenum. Primary considerations include neurogenic tumor, extra-adrenal pheochromocytoma, less likely gastrointestinal stromal tumor or neuroendocrine tumor:   Upon questioning, patient has a family history of: - pericarotid paraganglioma (unresectable) and pheochromocytoma (hormonally active) in a brother (42 years old) - he was in a study at [facility] and had genetic testing >> father is carrier - neck paraganglioma (silent) in another brother 42 years old) - HTN in father (33 years old) - Pancreatic cancer in maternal grandmother (diagnosed in her early 68s)  Due to his family history, he was referred for genetic counseling.  He had this appointment on 09/09/2020. Blood was sent for a mutation panel containing 84 genes including: MEN1, RET, VHL, SDHx, MAX, TMEM127, PRKAR1A, Tp53, MLH and MSH.  The results were positive for SDHD mutation.  I referred him for a PET scan to screen for other catecholamine producing tumors (10/16/2020):  Mediastinal blood pool activity: SUV max 2.1   HEAD/NECK: Bilateral hypermetabolic lesions are identified in the deep neck bilaterally, in the region of the carotid bifurcation although vascular anatomy not well demonstrated on noncontrast CT imaging. Lesion on the left measures approximately 1.9 x 1.8 cm, but again, is not well demonstrated given lack of intravenous contrast. This lesion demonstrates SUV max = 20.8. The lesion on the right measures approximately 2.0 x 1.3 cm with SUV max = 13.2.   Incidental CT findings: none   CHEST: No hypermetabolic mediastinal or hilar nodes. No suspicious pulmonary nodules on the CT scan.   Incidental CT findings:  none   ABDOMEN/PELVIS: No abnormal hypermetabolic activity within the liver, pancreas, adrenal glands, or spleen. No hypermetabolic lymph nodes in the abdomen or pelvis.The 2.9 cm left retroperitoneal lesion described on previous CT scan is markedly hypermetabolic with SUV max = 17.4.    Incidental CT findings: none   SKELETON: No focal hypermetabolic activity to suggest skeletal metastasis.   Incidental CT findings: Air-fluid levels noted in the maxillary sinuses bilaterally with opacification of scattered ethmoid air cells. Status post pin placement right femoral neck.   EXTREMITIES: No abnormal hypermetabolic activity in the lower extremities.   Incidental CT findings: none   IMPRESSION: 1. Bilateral hypermetabolic soft tissue lesions in the neck, in the region of each carotid bifurcation. Given the reported family history, bilateral glomus tumor would be a distinct consideration. Neck CT with contrast recommended to further evaluate. 2. Known left retroperitoneal lesion is markedly hypermetabolic. 3. No evidence for hypermetabolic disease in the chest or pelvis. No unexpected hypermetabolism in the bony anatomy or lower extremities.  CT scan (10/28/2020): Vascular: Prominently enhancing mass lesions are seen within the carotid sheath bilaterally at the level of the carotid bifurcation with splaying of the ECA-ICA on each side, measuring approximately 2.7 x 1.6 x 1.6 cm on the right and 3 x 2.2 x 1.9 cm on the left. Findings are more consistent with bilateral carotid body tumors.   Limited intracranial: Negative.   Visualized orbits: Negative.   Mastoids and visualized paranasal sinuses: Fluid level within the bilateral maxillary sinuses and mucosal thickening in the bilateral ethmoid cells. Correlate clinically for acute sinusitis.   Skeleton: No acute or aggressive process.   Upper chest: Paraseptal emphysema on the left.   Other: None.   IMPRESSION: 1. Bilateral carotid bifurcation tumors measuring up to 2.7 cm on the right and 3 cm on the left, most consistent with carotid body tumors. 2. Inflammatory paranasal sinus disease. Correlate clinically for acute sinusitis.  Surgery for retroperitoneal mass resection (12/16/2020): A. SOFT TISSUE  MASS, RETROPERITONEAL, EXCISION:  - Extra-adrenal paraganglioma, see comment  The lesional cells are positive for synaptophysin, chromogranin and  vimentin with only scattered cells staining for S100.  Cells are  negative for AE1/AE3, Melan-A, HMB45, desmin, CD31, SMA, D2-40, AFP,  glypican-3 and GATA3.  Immunostain for Ki-67 shows a low proliferative  index.  This immunoprofile is consistent with above interpretation.   Surgery for left carotid body tumor resection (02/21/2021) -Dr. Serene: A. SOFT TISSUE TUMOR, LEFT CAROTID BODY, EXCISION:  - Carotid body paraganglioma, 2.7 cm. - Tumor focally involves margin of specimen.  - See comment.   COMMENT:  The tumor is characterized by epithelioid cells in clusters surrounded  by fibrovascular stroma.  Immunohistochemistry is positive for CD56,  synaptophysin, chromogranin and S100 while GATA3 and cytokeratin AE1/AE3  are negative.  Ki-67 shows a low proliferation rate.  The morphology and  immunophenotype are consistent with extra-adrenal paraganglioma (carotid  body paraganglioma).   Surgery for right carotid body tumor resection (03/07/2021) -Dr. Serene:  A. CAROTID, RIGHT WITH CERVICAL LYMPH NODE, EXCISION:  - Paraganglioma, 1.7 cm, involving the right carotid  - No evidence of necrosis or increased mitotic activity  - Inked surface is positive for tumor  - Benign lymph node   Immunohistochemical stain for Ki-67 shows a low proliferative index of  about 2%.  CT chest/abdomen/pelvis (04/16/2022): 1. Interval surgical resection of the left retroperitoneal mass without evidence of local recurrence. 2. No evidence of new or progressive disease within the chest,  abdomen, or pelvis. 3. Moderate volume of formed stool in the colon suggestive of constipation. 4. Aortic Atherosclerosis (ICD10-I70.0) and Emphysema (ICD10-J43.9).  CT soft tissue neck (04/16/2022): Radiopaque liquid embolic material near both carotid bifurcations,  a sequela of pre-surgical embolization of carotid body tumors. No visible recurrent or residual mass.  CT abdomen and pelvis (11/11/2022): Lower chest: The visualized lung bases are clear.   No intra-abdominal free air or free fluid.   Hepatobiliary: The liver is unremarkable. No biliary dilatation. The gallbladder is unremarkable.   Pancreas: Unremarkable. No pancreatic ductal dilatation or surrounding inflammatory changes.   Spleen: Normal in size without focal abnormality.   Adrenals/Urinary Tract: The adrenal glands are unremarkable. The kidneys, visualized ureters, and urinary bladder appear unremarkable.   Stomach/Bowel: There is no bowel obstruction or active inflammation. Normal appendix.   Vascular/Lymphatic: Mild aortoiliac atherosclerotic disease. The IVC is unremarkable. No portal venous gas. There is no adenopathy. Small area of retroperitoneal calcification to the left of the abdominal aorta, sequela prior surgery. No suspicious lesion.   Reproductive: The prostate and seminal vesicles are grossly unremarkable. No pelvic mass.   Other: None   Musculoskeletal: Status post prior internal fixation of the right femoral neck. No acute osseous pathology.   IMPRESSION: 1. No acute intra-abdominal or pelvic pathology. Normal appendix. 2.  Aortic Atherosclerosis (ICD10-I70.0).  MRI neck (04/16/2023): Negative MRI of the neck. No evidence of residual or recurrent tumor in the region of either carotid bifurcation.  CT chest (02/28/2024): 1.  No acute pulmonary embolism or aortic dissection.  2.  Minimal coronary artery calcification.   I reviewed his catecholamine and metanephrine levels: Component     Latest Ref Rng 03/23/2023  Metanephrine, Pl     <=57 pg/mL 55   Normetanephrine, Pl     <=148 pg/mL 73   Total Metanephrines-Plasma     <=205 pg/mL 128    Component     Latest Ref Rng 02/27/2022  Epinephrine     pg/mL 39   Norepinephrine      pg/mL 524    Dopamine     pg/mL <10   Total Catecholamines     pg/mL 563   Metanephrine, Pl     <=57 pg/mL 81 (H)   Normetanephrine, Pl     <=148 pg/mL 164 (H)   Total Metanephrines-Plasma     <=205 pg/mL 245 (H)     Component     Latest Ref Rng & Units 06/27/2021 (3-4 mo postop)  Metanephrine, Pl     <=57 pg/mL 38  Normetanephrine, Pl     <=148 pg/mL 101  Total Metanephrines-Plasma     <=205 pg/mL 139   Component     Latest Ref Rng & Units 06/27/2021  Epinephrine     pg/mL 50  Norepinephrine      pg/mL 388  Dopamine     pg/mL <10  Total Catecholamines     pg/mL 438  Metanephrine, Pl     <=57 pg/mL 38  Normetanephrine, Pl     <=148 pg/mL 101  Total Metanephrines-Plasma     <=205 pg/mL 139   Component     Latest Ref Rng & Units 09/24/2020 (Preop) 12/27/2020 (11 days postop)  Epinephrine     pg/mL 46 108 (H)  Norepinephrine      pg/mL 1,810 (H) 488  Dopamine     pg/mL 69 (H) 39 (H)  Total Catecholamines     pg/mL 1,925 (H) 635  Metanephrine, Pl     <=57  pg/mL 40 74 (H)  Normetanephrine, Pl     <=148 pg/mL 634 (H) 148  Total Metanephrines-Plasma     <=205 pg/mL 674 (H) 222 (H)  Epinephrine and metanephrine were slightly higher at last visit, possibly in relationship to recent surgery.   Patient's medical history includes: -Lumbar back pain with radiculopathy -Right hip fracture in 2007 s/p ORIF -Sinusitis -Bilateral varicocele and hydrocele  He denies: -Hypertensive spells -Palpitations -Increased sweating -Headaches -Increased anxiety  Reviewed previous blood pressure levels: BP Readings from Last 3 Encounters:  03/21/24 128/70  03/23/23 (!) 142/80  11/11/22 (!) 127/95   He is not on antihypertensive medications.  02/28/2024:     Chemistry      Component Value Date/Time   NA 137 11/11/2022 1628   K 3.9 11/11/2022 1628   CL 105 11/11/2022 1628   CO2 24 11/11/2022 1628   BUN 10 11/11/2022 1628   CREATININE 1.34 (H) 11/11/2022 1628      Component  Value Date/Time   CALCIUM  9.2 11/11/2022 1628   ALKPHOS 50 11/11/2022 1628   AST 24 11/11/2022 1628   ALT 16 11/11/2022 1628   BILITOT 0.9 11/11/2022 1628     No history of hypokalemia: Lab Results  Component Value Date   K 3.9 11/11/2022   K 4.0 03/08/2021   K 3.8 03/07/2021   K 3.9 03/06/2021   K 4.1 03/06/2021   K 3.7 02/22/2021   K 3.7 02/21/2021   K 3.7 02/20/2021   K 4.0 02/19/2021   K 3.7 02/14/2021   K 3.8 12/17/2020   K 3.5 12/16/2020   K 3.6 12/14/2020   K 4.1 12/03/2020   K 3.8 11/27/2020   K 4.6 08/26/2020   K 4.2 08/25/2020   No known history of hypo or hyperthyroidism. No results found for: TSH   No h/o diabetes: 12/21/2008: HbA1c 5.8% No results found for: HGBA1C   No known hyperparathyroidism or hypercalcemia: 02/28/2024: Calcium  9.8 No results found for: PTH  Lab Results  Component Value Date   CALCIUM  9.2 11/11/2022   CALCIUM  9.4 03/08/2021   CALCIUM  9.0 03/07/2021   CALCIUM  9.3 03/06/2021   CALCIUM  9.5 03/06/2021   CALCIUM  9.3 02/22/2021   CALCIUM  9.5 02/21/2021   CALCIUM  9.2 02/20/2021   CALCIUM  9.1 02/19/2021   CALCIUM  9.3 02/14/2021   CALCIUM  9.4 12/17/2020   CALCIUM  8.6 (L) 12/16/2020   CALCIUM  9.4 12/14/2020   CALCIUM  9.9 12/03/2020   CALCIUM  9.7 11/27/2020   CALCIUM  9.4 08/26/2020   CALCIUM  9.6 08/25/2020   Pt. denies use of stimulants.   Denies street drug use.   Not on steroids.  He had a spinal block earlier in the year. He stopped smoking -gained some weight afterwards. Previously on gabapentin for back pain due to herniated disc, but stopped.  ROS: + see HPI   Past Medical History:  Diagnosis Date   Family history of pancreatic cancer 09/09/2020   Family history of pheochromocytoma 09/09/2020   Lumbar herniated disc    Monoallelic mutation of SDHD gene 10/01/2020   Past Surgical History:  Procedure Laterality Date   CAROTID BODY TUMOR EXCISION Left 02/21/2021   Procedure: TUMOR EXCISION LEFT CAROTID  BODY;  Surgeon: Jesus Oliphant, MD;  Location: [facility] OR;  Service: ENT;  Laterality: Left;   HIP SURGERY     IR ANGIO EXTERNAL CAROTID SEL EXT CAROTID UNI R MOD SED  03/06/2021   IR ANGIO INTRA EXTRACRAN SEL COM CAROTID INNOMINATE BILAT MOD SED  02/19/2021   IR ANGIO INTRA EXTRACRAN SEL COM CAROTID INNOMINATE UNI R MOD SED  03/06/2021   IR ANGIO VERTEBRAL SEL SUBCLAVIAN INNOMINATE UNI R MOD SED  03/11/2021   IR ANGIOGRAM FOLLOW UP STUDY  02/19/2021   IR ANGIOGRAM FOLLOW UP STUDY  02/19/2021   IR ANGIOGRAM FOLLOW UP STUDY  02/19/2021   IR ANGIOGRAM FOLLOW UP STUDY  03/06/2021   IR TRANSCATH/EMBOLIZ  02/19/2021   IR TRANSCATH/EMBOLIZ  03/06/2021   MASS BIOPSY Right 03/07/2021   Procedure: RIGHT CAROTID BODY TUMOR EXCISION;  Surgeon: Jesus Oliphant, MD;  Location: [facility] OR;  Service: ENT;  Laterality: Right;   RADIOLOGY WITH ANESTHESIA N/A 02/19/2021   Procedure: EMBOLIZATION;  Surgeon: Dolphus Carrion, MD;  Location: [facility] OR;  Service: Radiology;  Laterality: N/A;   RADIOLOGY WITH ANESTHESIA N/A 03/06/2021   Procedure: Right carotid body tumor embolization;  Surgeon: Dolphus Carrion, MD;  Location: [facility] OR;  Service: Radiology;  Laterality: N/A;   RESECTION OF RETROPERITONEAL MASS N/A 12/16/2020   Procedure: EXCISION OF RETROPERITONEAL MASS PHEOCHROMOCYTOMA;  Surgeon: Dasie Leonor CROME, MD;  Location: [facility] OR;  Service: General;  Laterality: N/A;   Social History   Socioeconomic History   Marital status: Married    Spouse name: Not on file   Number of children: 4   Years of education: Not on file   Highest education level: Not on file  Occupational History   Occupation: Delivery driver for [employer]  Tobacco Use   Smoking status: Former    Current packs/day: 0.00    Types: Cigarettes    Quit date: 08/2020    Years since quitting: 3.6   Smokeless tobacco: Never  Vaping Use   Vaping status: Former  Substance and Sexual Activity   Alcohol use: Yes    Comment: occassionally- once every 2 weeks    Drug use: No   Sexual activity: Yes  Other Topics Concern   Not on file  Social History Narrative   Not on file   Social Drivers of Health   Financial Resource Strain: Not on file  Food Insecurity: Not on file  Transportation Needs: Not on file  Physical Activity: Not on file  Stress: Not on file  Social Connections: Unknown (09/05/2021)   [facility]   Social Network    Social Network: Not on file  Intimate Partner Violence: Unknown (08/04/2021)   Received from [facility]   HITS    Physically Hurt: Not on file    Insult or Talk Down To: Not on file    Threaten Physical Harm: Not on file    Scream or Curse: Not on file   Current Outpatient Medications on File Prior to Visit  Medication Sig Dispense Refill   atorvastatin (LIPITOR) 10 MG tablet Take 10 mg by mouth daily.     No current facility-administered medications on file prior to visit.   No Known Allergies Family History  Problem Relation Age of Onset   Hypertension Father    Pancreatic cancer Maternal Grandmother        dx early 3s   Prostate cancer Paternal Grandfather        d. 38s   Other Half-Brother        paternal; paraganglioma and pheochromocytoma; SDHD mutation    Other Half-Brother        paternal; ? paraganglioma   PE: BP 128/70   Pulse 76   Ht 6' (1.829 m)   Wt 164 lb 6.4 oz (74.6 kg)  SpO2 98%   BMI 22.30 kg/m   Wt Readings from Last 3 Encounters:  03/21/24 164 lb 6.4 oz (74.6 kg)  03/23/23 154 lb 9.6 oz (70.1 kg)  11/11/22 163 lb (73.9 kg)   Constitutional: normal weight, in NAD Eyes:  EOMI, no exophthalmos ENT: no neck masses, no cervical lymphadenopathy Cardiovascular: RRR, No MRG Respiratory: CTA B Musculoskeletal: no deformities Skin:no rashes Neurological: no tremor with outstretched hands  ASSESSMENT: 1.  Retroperitoneal paraganglioma  2.  Carotid body tumor (paraganglioma  - Family history of pheochromocytoma and paraganglioma  - Positive for SDHD  mutation  3.  Elevated blood pressure  PLAN: 1. Retroperitoneal paraganglioma -In 08/2020, patient was incidentally found to have a left retroperitoneal mass measuring 2.9 cm during investigation for appendicitis.  At that time, upon questioning, he had a family history of paraganglioma in 2 of his brothers and pheochromocytoma in one of his brothers.  He also had hypertension in his father.  Patient himself had stage 0-1 hypertension.  He was referred to a dentist.  He had a panel of gene mutations performed (for Li-Fraumeni syndrome, Lynch syndrome, Carney complex, MEN 1, MEN 2, VHL, SDH mutations, etc.).  Results were positive for SDHD mutation.  He was referred to endocrinology and surgery. -When I first saw him, his norepinephrine  and normetanephrine levels were very elevated, confirming the suspicion for catecholamine producing tumor.  A PET scan was performed and this returned showing increased activity in the left retroperitoneal mass and also bilateral carotids, pointing towards bilateral neck paragangliomas.  -He had resection of the retroperitoneal mass on 12/16/2020 by Dr. Dasie and pathology showed a low Ki-67 index.   -He had an abdominal/pelvic CT scan 04/16/2022 showing no persistent or recurrent paraganglioma or any signs of metastasis.  At the same time, he had a CT of the neck which also did not show any persistent masses.  He had another CT scan 11/11/2022 (after he presented to the emergency room with abdominal pain) showing no persistent or recurrent paraganglioma.  - At today's visit, we will check another set of metanephrines and continue to monitor him yearly - Going forward, per guidelines, a whole-body MRI would be indicated in 2 years.  If this is not covered, we and head/neck + abdominal/pelvis MRI.  At last visit we checked a head/neck MRI.  Plan to repeat imaging next year, both head/neck MRI + abdominal/pelvis MRI -His son was investigated for the mutation and he  was negative.  He also has 2 daughters, 1 younger  (the threshold for testing is 42 years old for this mutation) and the other 42 y/o (lives in [place]  with her mother).  He did discuss with the mother about testing her and they will proceed with this. -I will see him back in 1 year  2.  Carotid body tumor (neck paraganglioma) -These are bilateral, discovered on PET scanning in the setting of investigation for SDHB mutation and also in the setting of retroperitoneal paraganglioma.  Neck MRI confirmed the presence of the masses.  He was referred to vascular surgery and saw Dr. Serene and Dr. Jesus.  He had resection of bilateral paragangliomas with a left mass resected on 02/21/2021 and the right mass on 03/07/2021, after the respective embolization. -His plasma catecholamines were normal, but the metanephrines were nonspecifically mildly elevated previously.  At last visit, the metanephrines were all normal. -At last visit we repeated an MRI of his neck and there was no paraganglioma recurrence or any suspicious  masses -He also recently was seen in the emergency room for chest pain.  He has had a CTA of the chest and per review of the report, there were no abnormalities in the chest or the upper abdomen other than minimal coronary artery calcification.  He is on a statin. -At today's visit, we again discussed about the potential for recurrence/metastasis, which is lower for the SDHB mutation (less than 4%), compared to the SDHB mutation, but not 0.  We need to continue to check plasma metanephrines once a year.  If these are elevated, we need to get urine metanephrines, which are more specific.  Catecholamines are not recommended for follow-up anymore. -We also discussed that the guidelines recommend imaging every 1 to 2 years (whole-body MRI, or, alternatively, if not covered, head/neck + abdomen/pelvis MRI - Plan to repeat the neck MRI next year -I will see him back in 1 year  3. HTN - He had  slightly elevated blood pressure in the past, that normalized, but at last visit, this was again slightly elevated.  He did mention that he rushed to the appointment at that time. - At today's visit blood pressure is 127/70, under the threshold for hypertension. -We discussed about checking his blood sugars at home.  He was doing so previously but not anymore.  We discussed about starting to do so and let me know if there is any increase.  I advised him that 130/80 is the threshold for hypertension.  If his blood pressure is higher than this consistently, he may need to start antihypertensives. - He was on alpha blockade before his surgeries  Orders Placed This Encounter  Procedures   Metanephrines, plasma   At today's visit we discussed that he may decide to start seeing endocrinology closer to home and we can send the records to.  He is not sure if he wants to do this.  I advised him that I would be happy to see him back if he decides to do so.  Component     Latest Ref Rng 03/21/2024  Metanephrine, Pl     <=57 pg/mL 60 (H)   Normetanephrine, Pl     <=148 pg/mL 83   Total Metanephrines-Plasma     <=205 pg/mL 143   Very slight elevation in metanephrines.  No intervention needed for now, we just need to repeat her labs in a year.  Lela Fendt, MD PhD [facility] Endocrinology

## 2024-03-21 NOTE — Patient Instructions (Signed)
Please stop at the lab.  Please return in 1 year.  

## 2024-03-25 LAB — METANEPHRINES, PLASMA
Metanephrine, Free: 60 pg/mL — ABNORMAL HIGH (ref <=57)
Normetanephrine, Free: 83 pg/mL (ref <=148)
Total Metanephrines-Plasma: 143 pg/mL (ref <=205)

## 2024-03-27 ENCOUNTER — Ambulatory Visit: Payer: Self-pay | Admitting: Internal Medicine

## 2025-03-22 ENCOUNTER — Ambulatory Visit: Admitting: Internal Medicine
